# Patient Record
Sex: Male | Born: 1940 | Race: White | Hispanic: No | Marital: Married | Smoking: Former smoker
Health system: Southern US, Community
[De-identification: ages and names within clinical notes are randomized; demographics above are authoritative.]

## PROBLEM LIST (undated history)

## (undated) DIAGNOSIS — I10 Essential (primary) hypertension: Secondary | ICD-10-CM

## (undated) DIAGNOSIS — S0183XA Puncture wound without foreign body of other part of head, initial encounter: Secondary | ICD-10-CM

## (undated) DIAGNOSIS — E119 Type 2 diabetes mellitus without complications: Secondary | ICD-10-CM

## (undated) DIAGNOSIS — T4145XA Adverse effect of unspecified anesthetic, initial encounter: Secondary | ICD-10-CM

## (undated) DIAGNOSIS — G4733 Obstructive sleep apnea (adult) (pediatric): Secondary | ICD-10-CM

## (undated) DIAGNOSIS — C4490 Unspecified malignant neoplasm of skin, unspecified: Secondary | ICD-10-CM

## (undated) DIAGNOSIS — I4891 Unspecified atrial fibrillation: Secondary | ICD-10-CM

## (undated) DIAGNOSIS — T8859XA Other complications of anesthesia, initial encounter: Secondary | ICD-10-CM

## (undated) DIAGNOSIS — I639 Cerebral infarction, unspecified: Secondary | ICD-10-CM

## (undated) DIAGNOSIS — K219 Gastro-esophageal reflux disease without esophagitis: Secondary | ICD-10-CM

## (undated) DIAGNOSIS — C641 Malignant neoplasm of right kidney, except renal pelvis: Secondary | ICD-10-CM

## (undated) DIAGNOSIS — E785 Hyperlipidemia, unspecified: Secondary | ICD-10-CM

## (undated) DIAGNOSIS — I2510 Atherosclerotic heart disease of native coronary artery without angina pectoris: Secondary | ICD-10-CM

## (undated) HISTORY — PX: OTHER SURGICAL HISTORY: SHX169

## (undated) HISTORY — PX: THYROIDECTOMY, PARTIAL: SHX18

## (undated) HISTORY — DX: Type 2 diabetes mellitus without complications: E11.9

## (undated) HISTORY — PX: CARDIAC CATHETERIZATION: SHX172

## (undated) HISTORY — PX: TONSILLECTOMY AND ADENOIDECTOMY: SUR1326

## (undated) HISTORY — PX: NASAL SINUS SURGERY: SHX719

## (undated) HISTORY — PX: KNEE ARTHROSCOPY: SHX127

## (undated) HISTORY — PX: SHOULDER ARTHROSCOPY: SHX128

---
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
## 1998-03-06 DIAGNOSIS — C641 Malignant neoplasm of right kidney, except renal pelvis: Secondary | ICD-10-CM

## 1998-03-06 HISTORY — PX: PARTIAL NEPHRECTOMY: SHX414

## 1998-03-06 HISTORY — DX: Malignant neoplasm of right kidney, except renal pelvis: C64.1

## 1998-06-10 ENCOUNTER — Encounter: Payer: Self-pay | Admitting: Family Medicine

## 1998-06-10 ENCOUNTER — Ambulatory Visit (HOSPITAL_COMMUNITY): Admission: RE | Admit: 1998-06-10 | Discharge: 1998-06-10 | Payer: Self-pay | Admitting: *Deleted

## 1998-07-05 ENCOUNTER — Inpatient Hospital Stay (HOSPITAL_COMMUNITY): Admission: RE | Admit: 1998-07-05 | Discharge: 1998-07-12 | Payer: Self-pay | Admitting: Urology

## 1998-07-07 ENCOUNTER — Encounter: Payer: Self-pay | Admitting: Urology

## 1998-07-08 ENCOUNTER — Encounter: Payer: Self-pay | Admitting: Urology

## 1999-08-02 ENCOUNTER — Ambulatory Visit (HOSPITAL_COMMUNITY): Admission: RE | Admit: 1999-08-02 | Discharge: 1999-08-02 | Payer: Self-pay | Admitting: Orthopedic Surgery

## 1999-08-02 ENCOUNTER — Encounter: Payer: Self-pay | Admitting: Orthopedic Surgery

## 1999-10-11 ENCOUNTER — Ambulatory Visit (HOSPITAL_COMMUNITY): Admission: RE | Admit: 1999-10-11 | Discharge: 1999-10-11 | Payer: Self-pay | Admitting: Urology

## 1999-10-11 ENCOUNTER — Encounter: Payer: Self-pay | Admitting: Urology

## 2000-02-09 ENCOUNTER — Ambulatory Visit (HOSPITAL_COMMUNITY): Admission: RE | Admit: 2000-02-09 | Discharge: 2000-02-09 | Payer: Self-pay | Admitting: Orthopedic Surgery

## 2001-05-13 ENCOUNTER — Encounter: Payer: Self-pay | Admitting: Urology

## 2001-05-13 ENCOUNTER — Encounter: Admission: RE | Admit: 2001-05-13 | Discharge: 2001-05-13 | Payer: Self-pay | Admitting: Urology

## 2001-05-17 ENCOUNTER — Encounter: Payer: Self-pay | Admitting: Urology

## 2001-05-17 ENCOUNTER — Encounter: Admission: RE | Admit: 2001-05-17 | Discharge: 2001-05-17 | Payer: Self-pay | Admitting: Urology

## 2001-06-27 ENCOUNTER — Encounter: Payer: Self-pay | Admitting: *Deleted

## 2001-06-27 ENCOUNTER — Ambulatory Visit (HOSPITAL_COMMUNITY): Admission: RE | Admit: 2001-06-27 | Discharge: 2001-06-27 | Payer: Self-pay | Admitting: *Deleted

## 2003-05-14 ENCOUNTER — Ambulatory Visit (HOSPITAL_COMMUNITY): Admission: RE | Admit: 2003-05-14 | Discharge: 2003-05-14 | Payer: Self-pay | Admitting: *Deleted

## 2004-01-15 ENCOUNTER — Ambulatory Visit (HOSPITAL_COMMUNITY): Admission: RE | Admit: 2004-01-15 | Discharge: 2004-01-15 | Payer: Self-pay | Admitting: Family Medicine

## 2004-04-22 ENCOUNTER — Encounter: Admission: RE | Admit: 2004-04-22 | Discharge: 2004-04-22 | Payer: Self-pay | Admitting: Neurology

## 2005-03-06 DIAGNOSIS — I639 Cerebral infarction, unspecified: Secondary | ICD-10-CM

## 2005-03-06 HISTORY — DX: Cerebral infarction, unspecified: I63.9

## 2005-04-03 ENCOUNTER — Ambulatory Visit: Payer: Self-pay | Admitting: Gastroenterology

## 2005-04-04 ENCOUNTER — Ambulatory Visit: Payer: Self-pay | Admitting: Gastroenterology

## 2005-06-30 ENCOUNTER — Ambulatory Visit: Payer: Self-pay | Admitting: Cardiology

## 2005-08-04 ENCOUNTER — Ambulatory Visit (HOSPITAL_COMMUNITY): Admission: RE | Admit: 2005-08-04 | Discharge: 2005-08-05 | Payer: Self-pay | Admitting: General Surgery

## 2005-08-04 ENCOUNTER — Encounter (INDEPENDENT_AMBULATORY_CARE_PROVIDER_SITE_OTHER): Payer: Self-pay | Admitting: *Deleted

## 2005-11-18 ENCOUNTER — Inpatient Hospital Stay (HOSPITAL_COMMUNITY): Admission: EM | Admit: 2005-11-18 | Discharge: 2005-11-21 | Payer: Self-pay | Admitting: Emergency Medicine

## 2005-11-20 ENCOUNTER — Encounter (INDEPENDENT_AMBULATORY_CARE_PROVIDER_SITE_OTHER): Payer: Self-pay | Admitting: Interventional Cardiology

## 2005-11-27 ENCOUNTER — Encounter: Admission: RE | Admit: 2005-11-27 | Discharge: 2005-11-27 | Payer: Self-pay | Admitting: Nurse Practitioner

## 2006-02-20 ENCOUNTER — Encounter: Admission: RE | Admit: 2006-02-20 | Discharge: 2006-02-20 | Payer: Self-pay | Admitting: Family Medicine

## 2007-02-07 ENCOUNTER — Encounter (INDEPENDENT_AMBULATORY_CARE_PROVIDER_SITE_OTHER): Payer: Self-pay | Admitting: Orthopedic Surgery

## 2007-02-07 ENCOUNTER — Ambulatory Visit: Payer: Self-pay | Admitting: Surgery

## 2007-02-07 ENCOUNTER — Ambulatory Visit (HOSPITAL_COMMUNITY): Admission: RE | Admit: 2007-02-07 | Discharge: 2007-02-07 | Payer: Self-pay | Admitting: Orthopedic Surgery

## 2007-12-16 ENCOUNTER — Ambulatory Visit: Payer: Self-pay

## 2008-03-06 HISTORY — PX: TOTAL KNEE ARTHROPLASTY: SHX125

## 2008-03-23 ENCOUNTER — Ambulatory Visit: Payer: Self-pay | Admitting: Cardiology

## 2008-03-23 DIAGNOSIS — I2510 Atherosclerotic heart disease of native coronary artery without angina pectoris: Secondary | ICD-10-CM | POA: Insufficient documentation

## 2008-03-23 DIAGNOSIS — E785 Hyperlipidemia, unspecified: Secondary | ICD-10-CM | POA: Insufficient documentation

## 2008-03-23 DIAGNOSIS — I10 Essential (primary) hypertension: Secondary | ICD-10-CM | POA: Insufficient documentation

## 2008-03-30 ENCOUNTER — Ambulatory Visit: Payer: Self-pay

## 2008-04-27 ENCOUNTER — Inpatient Hospital Stay (HOSPITAL_COMMUNITY): Admission: RE | Admit: 2008-04-27 | Discharge: 2008-05-01 | Payer: Self-pay | Admitting: Orthopedic Surgery

## 2008-10-06 ENCOUNTER — Ambulatory Visit (HOSPITAL_COMMUNITY): Admission: RE | Admit: 2008-10-06 | Discharge: 2008-10-06 | Payer: Self-pay | Admitting: Urology

## 2009-10-20 ENCOUNTER — Ambulatory Visit (HOSPITAL_BASED_OUTPATIENT_CLINIC_OR_DEPARTMENT_OTHER): Admission: RE | Admit: 2009-10-20 | Discharge: 2009-10-20 | Payer: Self-pay | Admitting: Family Medicine

## 2009-10-24 ENCOUNTER — Ambulatory Visit: Payer: Self-pay | Admitting: Internal Medicine

## 2009-10-28 ENCOUNTER — Ambulatory Visit (HOSPITAL_COMMUNITY): Admission: RE | Admit: 2009-10-28 | Discharge: 2009-10-28 | Payer: Self-pay | Admitting: Urology

## 2010-06-21 LAB — BASIC METABOLIC PANEL
BUN: 16 mg/dL (ref 6–23)
CO2: 25 mEq/L (ref 19–32)
CO2: 28 mEq/L (ref 19–32)
CO2: 29 mEq/L (ref 19–32)
Calcium: 8.1 mg/dL — ABNORMAL LOW (ref 8.4–10.5)
Chloride: 101 mEq/L (ref 96–112)
Chloride: 101 mEq/L (ref 96–112)
Chloride: 103 mEq/L (ref 96–112)
Creatinine, Ser: 1.63 mg/dL — ABNORMAL HIGH (ref 0.4–1.5)
GFR calc Af Amer: 51 mL/min — ABNORMAL LOW (ref >60)
GFR calc Af Amer: >60 mL/min (ref >60)
Glucose, Bld: 127 mg/dL — ABNORMAL HIGH (ref 70–99)
Glucose, Bld: 152 mg/dL — ABNORMAL HIGH (ref 70–99)
Potassium: 3.6 mEq/L (ref 3.5–5.1)
Potassium: 3.6 mEq/L (ref 3.5–5.1)
Sodium: 134 mEq/L — ABNORMAL LOW (ref 135–145)
Sodium: 136 mEq/L (ref 135–145)

## 2010-06-21 LAB — COMPREHENSIVE METABOLIC PANEL
Albumin: 3.8 g/dL (ref 3.5–5.2)
Alkaline Phosphatase: 32 U/L — ABNORMAL LOW (ref 39–117)
BUN: 17 mg/dL (ref 6–23)
Chloride: 109 mEq/L (ref 96–112)
Creatinine, Ser: 1.32 mg/dL (ref 0.4–1.5)
GFR calc non Af Amer: 54 mL/min — ABNORMAL LOW (ref >60)
Glucose, Bld: 102 mg/dL — ABNORMAL HIGH (ref 70–99)
Potassium: 4.4 mEq/L (ref 3.5–5.1)
Total Bilirubin: 0.7 mg/dL (ref 0.3–1.2)

## 2010-06-21 LAB — URINALYSIS, ROUTINE W REFLEX MICROSCOPIC
Bilirubin Urine: NEGATIVE
Hgb urine dipstick: NEGATIVE
Ketones, ur: NEGATIVE mg/dL
Nitrite: NEGATIVE
Protein, ur: NEGATIVE mg/dL
Urobilinogen, UA: 1 mg/dL (ref 0.0–1.0)

## 2010-06-21 LAB — PROTIME-INR
INR: 1 (ref 0.00–1.49)
INR: 1.2 (ref 0.00–1.49)
INR: 1.9 — ABNORMAL HIGH (ref 0.00–1.49)
INR: 2.8 — ABNORMAL HIGH (ref 0.00–1.49)
Prothrombin Time: 13.7 seconds (ref 11.6–15.2)
Prothrombin Time: 20.1 seconds — ABNORMAL HIGH (ref 11.6–15.2)
Prothrombin Time: 22.6 seconds — ABNORMAL HIGH (ref 11.6–15.2)

## 2010-06-21 LAB — CBC
HCT: 40 % (ref 39.0–52.0)
HCT: 33.1 % — ABNORMAL LOW (ref 39.0–52.0)
Hemoglobin: 13.6 g/dL (ref 13.0–17.0)
Hemoglobin: 11.1 g/dL — ABNORMAL LOW (ref 13.0–17.0)
Hemoglobin: 9.9 g/dL — ABNORMAL LOW (ref 13.0–17.0)
MCHC: 33.2 g/dL (ref 30.0–36.0)
MCHC: 33.3 g/dL (ref 30.0–36.0)
MCV: 87.4 fL (ref 78.0–100.0)
MCV: 88.6 fL (ref 78.0–100.0)
Platelets: 223 10*3/uL (ref 150–400)
Platelets: 183 10*3/uL (ref 150–400)
RBC: 3.22 MIL/uL — ABNORMAL LOW (ref 4.22–5.81)
RBC: 3.38 MIL/uL — ABNORMAL LOW (ref 4.22–5.81)
RBC: 3.75 MIL/uL — ABNORMAL LOW (ref 4.22–5.81)
RDW: 12.8 % (ref 11.5–15.5)
WBC: 6.5 10*3/uL (ref 4.0–10.5)
WBC: 12.1 10*3/uL — ABNORMAL HIGH (ref 4.0–10.5)

## 2010-06-21 LAB — TYPE AND SCREEN: Antibody Screen: NEGATIVE

## 2010-06-21 LAB — APTT: aPTT: 27 seconds (ref 24–37)

## 2010-07-19 NOTE — H&P (Signed)
NAME:  Henry Martin, Henry Martin NO.:  [id number]   MEDICAL RECORD NO.:  [id number]          PATIENT TYPE:  INP   LOCATION:                               FACILITY:  [facility]   PHYSICIAN:  Ollen Gross, M.D.    DATE OF BIRTH:  May 14, 1940   DATE OF ADMISSION:  04/17/2008  DATE OF DISCHARGE:                              HISTORY & PHYSICAL   CHIEF COMPLAINT:  Left knee pain.   HISTORY OF PRESENT ILLNESS:  The patient is a 70 year old male who has  been seeing Dr. Lequita Halt for ongoing left knee pain.  He has had problems  for quite a while now. He has undergone an open arthrotomy and medial  meniscectomy years ago and also had arthroscopy by Dr. Simonne Come 4-5  years ago.  He initially did well but has continued to have debilitating  pain and dysfunction.  He is now found to have end-stage arthritis of  left knee with bone-on-bone felt to be a good candidate.  Risks and  benefits have been discussed.  He elects to proceed with surgery.   ALLERGIES:  ISOVUE CONTRAST DYE and also CORTISONE.   CURRENT MEDICATIONS:  Aggrenox, Neurontin, Benicar, diltiazem, and  Lovaza. Vitamin D. Triplex.  Simvastatin.   PAST MEDICAL HISTORY:  History of CVA, hypertension, past history of  ileus following kidney surgery.  Past history of mild renal  insufficiency, and history of renal cancer.  Past history of blood clots  with embolus. Also a history of measles, mumps.  Mild coronary arterial  disease.   PAST SURGICAL HISTORY:  Right nephrectomy in 2000 secondary to cancer.  Partial thyroidectomy.  cardiac catheterizations x3 (Two were done for a  study with Condon. A third was done for EKG changes).   FAMILY HISTORY:  Father deceased at age 78 with kidney disease.  Mother  deceased at age 61 with congestive heart failure, diabetes, had two  uncles, both with kidney disease.   SOCIAL HISTORY:  Married, quit smoking about 1979.  One drink of alcohol  per month.  One child. And has 2 steps  entering his home.   REVIEW OF SYSTEMS:  GENERAL:  No fevers, chills, night sweats.  NEUROLOGICAL: No seizures, syncope, or paralysis.  Respiratory: No  shortness breath, productive cough, or hemoptysis.  CARDIOVASCULAR:  No  chest pain, angina, or orthopnea.  GI: No nausea, vomiting, diarrhea, or  constipation.  GU: No dysuria, hematuria, or discharge.  MUSCULOSKELETAL:  Left knee.   PHYSICAL EXAMINATION:  VITAL SIGNS: Pulse 72, respirations 12, blood  pressure 140/72.  GENERAL:  A 70 year old white male, overweight, large frame, alert and  oriented, cooperative. Accompanied by his wife.  HEENT: Normocephalic, atraumatic.  Pupils are round and reactive.  EOMs  intact.  Wears glasses.  NECK:  Supple.  CHEST: He is a barrel-chested individual.  Bowel sounds are present and  clear.  HEART:  Regular rate and rhythm.  No murmur.  ABDOMEN: Soft, round, protuberant abdomen. Bowel sounds present.  Rectal, breasts and genitalia were not done, not pertinent to present  illness.  EXTREMITIES:  Left  knee slight varus malalignment deformity.  No  effusion.  Range of motion 5-120 marked crepitus noted.   IMPRESSION:  1. Osteoarthritis left knee.   IMPRESSION:  Osteoarthritis, left knee.   PLAN:  The patient was admitted to [facility] to undergo a  left total knee replacement arthroplasty.  Surgery will be performed by  Ollen Gross.      Alexzandrew L. Perkins, P.A.C.      Ollen Gross, M.D.  Electronically Signed    ALP/MEDQ  D:  04/26/2008  T:  04/27/2008  Job:  [id number]   cc:   Ollen Gross, M.D.  Fax: [phone]   Madolyn Frieze. Jens Som, MD, [facility]  [address]  [address]  [place]  [place] [address]   Wilber Bihari. Caryn Section, M.D.  Fax: [phone]   Veverly Fells. Vernie Ammons, M.D.  Fax: [phone]   Ernestina Penna, M.D.  Fax: [phone]

## 2010-07-19 NOTE — Op Note (Signed)
NAME:  Henry Martin, Henry Martin NO.:  [id number]   MEDICAL RECORD NO.:  [id number]          PATIENT TYPE:  INP   LOCATION:  0003                         FACILITY:  [facility]   PHYSICIAN:  Ollen Gross, M.D.    DATE OF BIRTH:  1940/08/05   DATE OF PROCEDURE:  04/27/2008  DATE OF DISCHARGE:                               OPERATIVE REPORT   PREOPERATIVE DIAGNOSIS:  Osteoarthritis of the left knee.   POSTOPERATIVE DIAGNOSIS:  Osteoarthritis of the left knee.   PROCEDURE:  Left total knee arthroplasty.   SURGEON:  Ollen Gross, M.D.   ASSISTANT:  Tish Men.   ANESTHESIA:  Spinal.   ESTIMATED BLOOD LOSS:  Minimal.   DRAINS:  None.   TOURNIQUET TIME:  39 minutes at 300 mmHg.   COMPLICATIONS:  None.   CONDITION.:  Stable to recovery.   BRIEF CLINICAL NOTE:  Henry Martin is a 70 year old male with end-stage  arthritis of the left knee with progressively worsening pain and  dysfunction.  He has failed nonoperative management and presents for  total knee arthroplasty.   PROCEDURE IN DETAIL:  After the successful administration of spinal  anesthetic, a tourniquet is placed high on his left thigh and his left  lower extremity is prepped and draped in the usual sterile fashion.  The  extremity is wrapped in an Esmarch, the knee flexed, the tourniquet  inflated to 300 mmHg.  A midline incision is made with a 10 blade  through the subcutaneous tissue to the level of the extensor mechanism.  A fresh blade is used to make a medial parapatellar arthrotomy.  The  soft tissue over the proximal medial tibia is subperiosteally elevated  to the joint line with the knife and into the semimembranosus bursa with  a Cobb elevator.  The soft tissue laterally is elevated, with attention  being paid to avoid the patellar tendon on tibial tubercle.  The patella  is subluxated laterally, the knee flexed 90 degrees, and the ACL and PCL  removed.  A drill is used create a starting hole in  the distal femur and  the canal is thoroughly irrigated.  The 5-degree left valgus alignment  guide is placed and referencing off the posterior condyles, rotation is  marked and the block pinned to remove 11 mm off the distal femur.  I  took 11 because of a preop flexion contracture.  Distal femoral  resection is made with an oscillating saw.  A sizing block is placed and  a size 5 is most appropriate.  Rotation is marked off the epicondylar  axis.  The size 5 cutting block is placed and the anterior-posterior and  chamfer cuts are made.   The tibia is subluxated forward and the menisci are removed.  The  extramedullary tibial alignment guide is placed referencing proximally  at the medial aspect of the tibial tubercle and distally along the  second metatarsal axis and tibial crest.  The block is pinned to remove  10 mm off the non deficient lateral side.  Tibial resection is made with  an oscillating saw.  A  size 5 is the most appropriate tibial component  and the proximal tibia is prepared with a modular drill and keel punch  for the size 5.  Femoral preparation is completed with the intercondylar  cut.   A size 5 mobile bearing tibial trial, a size 5 posterior stabilized  femoral trial and a 10-mm posterior stabilized rotating platform insert  trial are placed.  With the 10, there is a little bit of varus-valgus  play but some little hyperextension, so I went with the 12.5, which  allowed for full extension with excellent varus-valgus and the anterior-  posterior balance throughout full range of motion.  The patella is then  everted, thickness measured to be 27 mm.  Freehand resection is taken to  15 mm, a 41 template is placed, lug holes are drilled, trial patella is  placed and it tracks normally.  Osteophytes are removed off the  posterior femur with the trial in place.  All the trials are removed and  the cut bone surfaces are prepared with pulsatile lavage.  The cement is   mixed and once ready for implantation, a size 5 mobile bearing tibial  tray, size 5 posterior stabilized femur and 41 patella are cemented into  place and the patella is held with a clamp.  The trial 12.5 insert is  placed, the knee held in full extension, and all extruded cement  removed.  When the cement is fully hardened, then the permanent 12.5-mm  posterior stabilized rotating platform insert is placed into the tibial  tray.  The wound is copiously irrigated with saline solution and FloSeal  injected on the posterior capsule, the medial and lateral gutters, and  suprapatellar area.  A moist sponge is placed and the tourniquet  released, for a total tourniquet time of 39 minutes.  The sponge is held  for 2 minutes, then removed.  Minimal bleeding is encountered.  The  bleeding that is encountered is stopped with electrocautery.  The wound  is again copiously irrigated and the arthrotomy closed with interrupted  #1 PDS.  Flexion against gravity is 140 degrees.  The subcu is closed  with interrupted 2-0 Vicryl and subcuticular running 4-0 Monocryl.  The  incision is cleaned and dried and Steri-Strips and a bulky sterile  dressing applied.  He is placed into a knee immobilizer, awakened and  transported to recovery in stable condition.      Ollen Gross, M.D.  Electronically Signed     FA/MEDQ  D:  04/27/2008  T:  04/28/2008  Job:  [id number]

## 2010-07-19 NOTE — Assessment & Plan Note (Signed)
[facility]                            CARDIOLOGY OFFICE NOTE   NAME:Henry Martin, Henry Martin                       MRN:          [id number]  DATE:03/23/2008                            DOB:          1940/05/04    Henry Martin is a very pleasant 70 year old gentleman who presents for  preoperative evaluation prior to left knee replacement.  The patient did  undergo cardiac catheterization in 2003 secondary to chest pain.  At  that time, he was found to have nonobstructive coronary disease and  there was a 70% proximal second marginal branch.  His ejection fraction  was 65%.  He had a repeat catheterization in 2005 as part of the  ASTEROID study.  At that time, he was found to have a 30% mid LAD, a 20-  30% circumflex, and a 20% marginal.  There was a 30% mid RCA.  His LV  function was normal.  The patient is scheduled to have left knee  replacement and we are asked to evaluate prior to his surgery.  Note, he  has dyspnea with more extreme activities, but not with routine  activities.  There is no orthopnea, PND, pedal edema, palpitations,  presyncope, syncope, or exertional chest pain.  There is no  claudication.   MEDICATIONS:  1. Benicar 40 mg/25 mg tablets one p.o. daily.  2. Fish oil 1 g p.o. daily.  3. Trilipix 135 mg p.o. daily.  4. Zocor 5 mg p.o. daily.  5. Lovaza.  6. Aggrenox.  7. Vitamin D.  8. Cardizem 180 mg p.o. daily.   ALLERGIES:  He has an allergy to IVP DYE by his report.   FAMILY HISTORY:  Negative for coronary artery disease.   PAST MEDICAL HISTORY:  Significant for hypertension and hyperlipidemia.  There is no diabetes mellitus.  He has had previous partial  thyroidectomy secondary to cyst.  He has had a history of partial  nephrectomy secondary to renal cell carcinoma.  He has had prior  TIA/CVA.  He has had a previous tonsillectomy.  He has also had previous  arthroscopic knee surgery.   SOCIAL HISTORY:  He has a remote history  of tobacco use, but has not  smoked in the past 30 years.  He rarely consumes alcohol.   REVIEW OF SYSTEMS:  He denies any headaches, fevers, or chills.  There  is no productive cough or hemoptysis.  There is no dysphagia,  odynophagia, melena, or hematochezia.  There is no dysuria or hematuria.  There is no rash or seizure activity.  There is no orthopnea, PND, or  pedal edema.  He does have some pain in his left knee with ambulation.  The remaining systems are negative.   PHYSICAL EXAMINATION:  VITAL SIGNS:  Today shows a blood pressure of  126/70 and his pulse of 59.  He weighs 253 pounds.  GENERAL:  He is a well-developed, well-nourished in no acute distress.  SKIN:  Warm and dry.  He does not appeared to be depressed.  There is no  peripheral clubbing.  BACK:  Normal.  HEENT:  Normal  with normal eyelids.  NECK:  Supple with a normal upstroke bilaterally.  No bruits noted.  There is no jugular venous distention.  I cannot appreciate thyromegaly.  CHEST:  Clear to auscultation.  No expansion.  CARDIOVASCULAR:  Regular rate and rhythm.  Normal S1 and S2.  There are  no murmurs, rubs, or gallops noted.  Note, his heart sounds were  distant.  ABDOMEN:  Nontender, nondistended.  Positive bowel sounds.  No  hepatosplenomegaly.  No mass appreciated.  There is no abdominal bruit.  He has 2+ femoral pulses bilaterally.  No bruits.  EXTREMITIES:  No edema.  I could palpate no cords.  He has 2+ posterior  tibial pulses bilaterally.  NEUROLOGIC:  Grossly intact.   His electrocardiogram shows a normal sinus rhythm at a rate of 61.  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  There are no ST  changes noted.   DIAGNOSES:  1. Preoperative evaluation prior to knee replacement - The patient has      mild dyspnea on exertion and also has hypertension, hyperlipidemia,      and remote history of tobacco use.  I  think it would be worthwhile      to proceed with an adenosine Myoview for risk stratification.  If      he has low risk, then he will be able to proceed safely with      surgery.  We will see him back on a p.r.n. basis, pending those      results.  2. Hypertension - His blood pressure is adequately controlled on his      present medications.  3. Hyperlipidemia - He will continue on his statin and Trilipix, and      this is being monitored by Dr. Christell Constant.  4. Remote history of renal cell cancer.  5. History of partial thyroidectomy.  6. CONTRAST DYE allergy.   I will see him back on an as-needed basis, pending results of his  Myoview.     Madolyn Frieze Jens Som, MD, [facility]  Electronically Signed    BSC/MedQ  DD: 03/23/2008  DT: 03/23/2008  Job #: [id number]   cc:   Ernestina Penna, M.D.  Mila Homer. Sherlean Foot, M.D.

## 2010-07-22 NOTE — Cardiovascular Report (Signed)
[place]  Patient:    Henry Martin, Henry Martin Visit Number: [id number] MRN: [id number]          Service Type: CAT Location: [facility] 2899 19 Attending Physician:  Veneda Melter Dictated by:   Veneda Melter, M.D. Proc. Date: 06/27/01 Admit Date:  06/27/2001   CC:         Madolyn Frieze. Jens Som, M.D. [facility]  Monica Becton, M.D.   Cardiac Catheterization  PROCEDURES PERFORMED: 1. Left heart catheterization. 2. Left ventriculogram. 3. Selective coronary angiography. 4. Intravascular ultrasound, left circumflex artery.  DIAGNOSES: 1. Mild coronary artery disease by angiogram. 2. Normal left ventricular systolic function.  HISTORY: The patient is a 70 year old white male, who had an episode of chest discomfort approximately six months ago. The patient underwent stress imaging study showing mild to moderate ischemia in the inferior wall with overall well preserved LV function. He presents for further assessment.  TECHNIQUE: After informed consent was obtained, the patient was brought to the cardiac catheterization lab where a 6 French sheath was placed in the right femoral artery. Left heart catheterization, left ventriculogram and selective coronary angiography were then performed using preformed 6 French Judkins catheters. We then elected to proceed with intravascular ultrasound of the left circumflex artery. The patient was given 3000 units of heparin intravenously. A 6 French CL 3.5 guide catheter was used to engage the left coronary artery and selective guide shots obtained after the administration of intracoronary nitroglycerin. A 0.014 inch Hi-Torque Floppy wire was advanced to the distal segment and second marginal branch, and intravascular ultrasound performed using automatic pullback. Repeat angiography was performed showing no evidence of distal vessel damage. The guide catheters and sheath were then manual pressure applied until adequate  hemostasis was achieved. The patient tolerated the procedure well and was transferred to the ward in stable condition.  FINDINGS: Findings are as follows: 1. Left main trunk: Short, angiographically normal. 2. LAD: This is a medium caliber vessel that provides a first    diagonal branch in the mid section. The LAD then extends to the apex.    There are mild irregularities in the LAD system of 10-20%. There does    appear to be mild focal narrowing of 20-30% after the diagonal branch.    The diagonal branch is a small caliber vessel with mild irregularities. 3. Left circumflex artery: This is a large caliber vessel that provides a    medium caliber first marginal branch in the proximal segment and a large    second marginal branch distally. The left circumflex system has mild    diffuse disease of 30% in the AV segment and proximal 70% in the second    marginal branch. 4. Right coronary artery is dominant: This is a large caliber vessel that    provides the posterior descending artery and two posterior ventricular    branches in its terminal segment. There is mild diffuse disease in the    proximal mid section of 20%. There is focal narrowing of 30% in the    junction of the mid and distal third vessel. In the distal branch, vessels    have mild irregularities.  LEFT VENTRICULOGRAM: Normal end-systolic and end-diastolic dimensions. Overall left ventricular function is well preserved, ejection fraction of greater than 65%.  No mitral regurgitation. LV pressure 150/15, aortic is 150/80, LVEDP equals 25.  ASSESSMENT AND PLAN: The patient is a 70 year old gentleman with mild coronary artery disease by angiogram. Continued medical therapy and aggressive  risk factor modification will be pursued.  The patient has undergone intravascular ultrasound for participation in the ASTEROID study and he will be treated per protocol. Dictated by:   Veneda Melter, M.D. Attending Physician:  Veneda Melter DD:  06/27/01 TD:  06/27/01 Job: [id number]

## 2010-07-22 NOTE — H&P (Signed)
NAME:  Henry Martin, Henry Martin                ACCOUNT NO.:  [id number]   MEDICAL RECORD NO.:  [id number]          PATIENT TYPE:  INP   LOCATION:  1825                         FACILITY:  [facility]   PHYSICIAN:  Michael L. Reynolds, M.D.DATE OF BIRTH:  Apr 26, 1940   DATE OF ADMISSION:  11/18/2005  DATE OF DISCHARGE:                                HISTORY & PHYSICAL   CHIEF COMPLAINT:  Left leg weakness.   HISTORY OF PRESENT ILLNESS:  This is the initial stroke service admission  for this 70 year old male with a past medical history which includes  hypertension, hypercholesterolemia.  The patient was working outside today  when about 11:00, he suddenly felt a dizzy sensation which was more like a  sensation of possibly passing out rather than a true vertigo.  He did notice  that his left leg felt funny especially from the knee down.  When he tried  to walk, it was weak, and it was dragging behind him, and he actually  required some assistance to ambulate.  He did not notice any symptoms at the  time involving his face or his arm.  EMS was alerted, and he was brought to  [facility] Emergency Room and code stroke was called en route.  On arrival,  the patient has had a definitely improved strength in his left leg and says  it no longer feels funny, but it still feels a little bit weak.  He denies  any history of previous similar events.  He denies any associated chest  pain, shortness of breath, nausea, vomiting, or loss of consciousness.  He  does have a slight right parietal headache.   MEDICAL HISTORY:  1. Hypertension.  2. High cholesterol.  3. He has mild coronary artery disease, done by catheterization, last done      a couple of years ago.  4. 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  5. He has had a partial nephrectomy for renal cell carcinoma in the past.   FAMILY HISTORY:  Negative for stroke.  His mother has a history of  congestive heart failure but is still alive at 64.   SOCIAL HISTORY:  He denies any tobacco use.  He is normally independent in  activities of daily living.   ALLERGIES:  IODINE.   MEDICATIONS:  1. Benicar HCT 40/12.5 mg daily.  2. Lipitor 10 mg daily.  3. Cartia XT 180 mg daily.  4. Baby aspirin 81 mg daily.  5. Multivitamin daily.   REVIEW OF SYSTEMS:  Full extensive review of systems is negative except as  outlined in the HPI and in the admission H&P and in the admission nursing  record.   PHYSICAL EXAMINATION:  VITAL SIGNS:  Temperature 96.9, blood pressure  125/61, pulse 68, respirations 20.  GENERAL:  This is a healthy-appearing man, somewhat obese but in no evident  distress.  HEAD:  Cranium normocephalic, atraumatic.  Oropharynx is benign.  NECK:  Supple without carotid or supraclavicular bruits.  HEART:  Regular rate and  rhythm without murmurs.  CHEST:  Clear to auscultation bilaterally.  ABDOMEN:  Obese with normoactive bowel sounds and no hepatosplenomegaly.  EXTREMITIES:  No edema, 2+ pulses.  NEUROLOGIC:  Mental status:  He is awake, alert, and fully oriented.  Speech  is fluent, not dysarthric.  He can name objects and repeat phrases.  He can  name objects and repeat phrases.  Mood euthymic and affect appropriate.  Cranial nerves:  Pupils are equal and reactive.  Extraocular movements full  without nystagmus.  Visual fields full to confrontation.  Face, tongue, and  palate move normally and symmetrically.  Facial sensation is symmetric.  Motor:  Normal bulk and tone.  There is slight weakness in the left hip  flexors, otherwise normal strength in all tested extremity muscles.  Sensation is intact to light touch and pinprick in all extremities.  Cerebellar:  Rapid movements are normal.  Finger-to-nose is normal.  Gait is  deferred.   LABORATORY REVIEW:  CT of the head is personally reviewed, and the study is  normal.  CBC and CMET are pending at this time.    IMPRESSION:  1. Right transient ischemic attack with left lower extremity weakness      which in improving.  His risk factors include hypertension and      hypercholesterolemia.   PLAN:  We will admit to the stroke service for a routine stroke work-up  including MRI/MRA, carotid transcranial Dopplers, 2 D echocardiogram, and  stroke labs.  He is likely not going to need any therapy assessments.  We  will continue aspirin pending the outcome of his work-up and at that point,  make a switch likely to another antiplatelet therapy such as Aggrenox.  Stroke service to follow.      Michael L. Thad Ranger, M.D.  Electronically Signed     MLR/MEDQ  D:  11/18/2005  T:  11/18/2005  Job:  [id number]   cc:   Ernestina Penna, M.D.

## 2010-07-22 NOTE — Discharge Summary (Signed)
NAME:  Henry Martin, Henry Martin NO.:  [id number]   MEDICAL RECORD NO.:  [id number]          PATIENT TYPE:  INP   LOCATION:  1610                         FACILITY:  [facility]   PHYSICIAN:  Ollen Gross, M.D.    DATE OF BIRTH:  01/31/1941   DATE OF ADMISSION:  04/27/2008  DATE OF DISCHARGE:  05/01/2008                               DISCHARGE SUMMARY   ADMITTING DIAGNOSES:  1. Osteoarthritis, left knee.  2. History of cerebrovascular accident.  3. Hypertension.  4. Past history of ileus following kidney surgery.  5. Past history of mild renal insufficiency.  6. History of renal cancer.  7. Past history of blood clots with embolus.  8. History of measles and mumps.  9. Mild coronary arterial disease.   DISCHARGE DIAGNOSES:  1. Osteoarthritis, left knee, status post left total knee replacement      and arthroplasty.  2. Mild postoperative blood loss anemia, did not require transfusion.  3. Postoperative hyponatremia, improved.  4. Mild renal insufficiency, improving.  5. History of cerebrovascular accident.  6. Hypertension.  7. Past history of ileus following kidney surgery.  8. Past history of mild renal insufficiency.  9. History of renal cancer.  10.Past history of blood clots with embolus.  11.History of measles and mumps.  12.Mild coronary arterial disease.   PROCEDURE:  April 27, 2008:  Left total knee.   SURGEON:  Dr. Lequita Halt.   ASSISTANT:  Avel Peace, PA-C.   ANESTHESIA:  Spinal.   CONSULTATIONS:  None.   TOURNIQUET TIME:  39 minutes.   BRIEF HISTORY:  Mr. Kleckner is a 70 year old male with end-stage  arthritis of the left knee with progressive worsening pain and  dysfunction,failed nonoperative management, who now presents for total  knee arthroplasty.   LABORATORY DATA:  Preop CBC:  Hemoglobin 13.6, hematocrit 40.0, white  cell count 6.55.  Postop hemoglobin 11.1 drifted down to 9.9, last H and  H was 9.5 and 28.5.  PT/PTT preop 13.7 and 27,  respectively.  INR 1.0.  Serial prothrombin time as follow:  Last night PT/INR 31.4 and 2.8.  Chem panel on admission:  Slightly elevated sodium of 147, slightly low  ALP of 32.  Remaining chem panel within normal limits.  Serial BMETs are  as follows:  Sodium did drop down to 134 back to 136, creatinine went up  from 1.3 to 1.7, was improving back down to 1.6.  Remaining electrolytes  remained within normal limits.  Preop UA negative.  Blood group type A  negative.  Chest x-ray dated March 17, 2008:  Mild chronic bronchitic  changes.   EKG dated March 21, 2008:  Sinus rhythm, premature supraventricular  complexes, right bundle branch block, left anterior fascicular block,  left ventricular hypertrophy with QRS widening, confirmed, unable to  read signature.   HOSPITAL COURSE:  The patient admitted to [facility], taken to  the OR, underwent above-stated procedure without complication.  The  patient tolerated procedure well.  Later transferred to recovery room  and orthopedic floor.  Started on a PCA and p.o. analgesics for pain  control following surgery.  Started back on his home medications.  Pressure was a little soft, so we put his blood pressure medications on  parameters.  He did have a spinal with Duramorph.  Had a history of clot  so we added Lovenox until his INR is therapeutic.  Monitored his urine  output which was descent.  He had a little bit of bump in his creatinine  postoperatively but will monitor that.  Started getting him up out of  bed on day 1.  By day 2, he was doing a little bit better.  Seen in  rounds, sleeping well.  Hemoglobin was stable.  His creatinine had  bumped up to 1.6 though.  Watched his creatinine, watched his output,  continued with fluids, although his creatinine did improve and went up  to 1.78 back down to 1.6 by day 2.  He responded to the fluids and his  output increased by day 3.  He had a little bit of scant amount of   drainage.  Incision was healing well though.  He had a history of  constipation with a postop ileus so we added Reglan and some other  medication to help with his bowels.  He had a small amount of movement  on the evening of day 2 but needed a little assistance with medications.  He had a drop in his sodium also postop with the extra fluids we gave  him.  That had already turned around and started to improve, so we  monitored him another day.  His creatinine improved.  He started moving  his bowels and, by day 4, he was doing better, tolerating his  medications and discharged home.   DISCHARGE/PLAN:  1. Was discharged home on May 01, 2008.  2. Discharge diagnoses:  Please see above.  3. Discharge medications:  Coumadin, Nu-Iron, Percocet, Robaxin.   DIET:  As tolerated, the heart healthy diet.   FOLLOWUP:  Follow up in 2 weeks.   ACTIVITY:  Weightbearing as tolerated.  PT home.   DISPOSITION:  Home.   CONDITION ON DISCHARGE:  Improving.      Alexzandrew L. Perkins, P.A.C.      Ollen Gross, M.D.  Electronically Signed    ALP/MEDQ  D:  05/30/2008  T:  05/30/2008  Job:  [id number]   cc:   Madolyn Frieze. Jens Som, MD, [facility]  [address]  [address]  [place]  [place] [address]   Wilber Bihari. Caryn Section, M.D.  Fax: [phone]   Veverly Fells. Vernie Ammons, M.D.  Fax: [phone]   Candie Echevaria, M.D.

## 2010-07-22 NOTE — Discharge Summary (Signed)
NAME:  Henry Martin, Henry Martin                ACCOUNT NO.:  [id number]   MEDICAL RECORD NO.:  [id number]          PATIENT TYPE:  INP   LOCATION:  3010                         FACILITY:  [facility]   PHYSICIAN:  Pramod P. Pearlean Brownie, MD    DATE OF BIRTH:  1940-10-20   DATE OF ADMISSION:  11/18/2005  DATE OF DISCHARGE:  11/21/2005                                 DISCHARGE SUMMARY   DIAGNOSES AT TIME OF DISCHARGE.:  1. 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  2. Hypertension  3. Dyslipidemia.  4. Coronary artery disease.  5. 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  6. Partial nephrectomy for renal cell carcinoma in the past.   MEDICINE AT THE TIME OF DISCHARGE.:  1. Hydrochlorothiazide 12.5 mg daily.  2. Lipitor 10 mg a day.  3. Multivitamin a day.  4. Benicar 40 mg a day.  5. Cartia 120 mg a day.  6. Fish oil 1200 mg a day.  7. Aggrenox 1 nightly x2 weeks, then increase to two times a day.  8. Aspirin 81 mg q.a.m. x2 weeks, then discontinue.  9. Tylenol 2 tablets 30 minutes to 1 hour before Aggrenox dose x1 week      only.   STUDIES PERFORMED:  1. CT of the head on admission is normal.  2. MRI of the brain showed small acute nonhemorrhagic right centrum      semiovale infarct in the parasagittal region.  Scattered mild      nonspecific white matter type changes.  Paranasal sinus mucosal      thickening of the left maxillary sinus, most notably.  3. MRI of the neck shows no hemodynamically significant stenosis involving      either carotid bifurcation.  There may be some very minimal narrowing      of the proximal aspect of the common carotid arteries bilaterally.      Left vertebral artery is dominant.  There is also signal in the      proximal right vertebral artery, likely artifactual loss.  Right      vertebral artery and posterior inferior cerebellar artery distribution      normal variant.  4. MRA of the head shows focal loss of signal narrowing  at the junction of      A1 an A2 segment of the right anterior cerebral artery.  Slight      narrowing right subclinoid and internal carotid artery.  5. Chest x-ray shows no cardiopulmonary disease.  6. Transthoracic echocardiogram shows an EF of 60% with no embolic source.  7. EKG shows normal sinus rhythm with right bundle branch block, left      anterior fascicular block.  8. Carotid Doppler shows no significant ICA stenosis.  9. Transcranial Doppler has been performed.  Results are pending.   LABORATORY STUDIES:  CBC with hemoglobin 12.0, hematocrit 35.7, white blood  cells 8.4, red blood cells 4.14, platelets 235.  Differential normal.  Coagulation studies on admission normal.  Chemistry with calcium 8.3,  albumin 3.2, otherwise normal. Liver function tests normal.  Hemoglobin A1c  5.9.  Homocystine 8.5. Cholesterol 95, triglycerides 56, HDL 32 and LDL 52.  The TSH was normal at 3.823.  Urinalysis was normal.  Cardiac enzymes  negative x1.   HISTORY OF PRESENT ILLNESS:  [name] is a 70 year old white male  with a past medical history of hypertension and dyslipidemia.  He was  working outside the day of admission when at about 11 a.m. he felt suddenly  dizzy which was more like a sensation of possibly passing out rather than a  true vertigo.  He did notice that his left leg felt funny, especially from  the knee down.  When he tried to walk, it was weak and was dragging behind  him. He actually required some assistance to ambulate.  He did not notice  any symptoms at the time involving his face or arm.  EMS was alert, and he  was brought to [facility] Emergency Room where a code stroke was called en  route.  On arrival, the patient has definitely improved strength in his left  leg and says it no longer feels funny but still feels a bit weak.  He denies  any history of previous similar events. He does have a slight right parietal  headache.  He was not a TPA candidate  secondary to quick resolution.  He was  admitted to the hospital for further stroke evaluation.   HOSPITAL COURSE:  MRI did confirm an infarct in the right frontal  subcortical region which was found to be secondary to small-vessel disease.  He does have vascular risk factors of hypertension and dyslipidemia.  He was  on aspirin prior to admission.  Therefore, was changed Aggrenox for  secondary stroke prevention.  He had some mild deficits that remain on his  left side, and outpatient PT and OT were recommended and arranged.  He will  need followup with his primary care physician for vascular risk factor  control as well as to follow up with Dr. Pearlean Brownie in 2-3 months.   CONDITION ON DISCHARGE:  The patient alert and oriented x3.  No aphasia or  dysarthria.  No cranial nerve deficits per Dr. Marlis Edelson exam.  No focal  weakness.   DISCHARGE/PLAN:  1. Discharge home with family.  2. Aggrenox for secondary stroke prevention.  3. Outpatient PT and OT as per therapy recommendations.  4. Follow up with primary care physician for vascular risk factor control.      Goal LDL less than 100.  Goal systolic blood pressure less than 130.  5. Follow up with Dr. Pearlean Brownie in 2-3 months.      Annie Main, N.P.    ______________________________  Sunny Schlein. Pearlean Brownie, MD    SB/MEDQ  D:  11/21/2005  T:  11/22/2005  Job:  [id number]   cc:   Ernestina Penna, M.D.  Outpatient Rehab,  [address]

## 2010-07-22 NOTE — Cardiovascular Report (Signed)
NAME:  Henry Martin, Henry Martin                          ACCOUNT NO.:  [id number]   MEDICAL RECORD NO.:  [id number]                   PATIENT TYPE:  OIB   LOCATION:  NA                                   FACILITY:  [facility]   PHYSICIAN:  Veneda Melter, M.D.                   DATE OF BIRTH:  07/27/1940   DATE OF PROCEDURE:  05/14/2003  DATE OF DISCHARGE:                              CARDIAC CATHETERIZATION   PROCEDURES PERFORMED:  1. Left heart catheterization.  2. Left ventriculogram.  3. Selective coronary angiography.  4. Intravascular ultrasound of the left circumflex artery.   DIAGNOSES:  1. Coronary artery disease.  2. Normal left ventricular systolic function.   HISTORY:  Henry Martin is a 70 year old gentleman with known history of  coronary disease that is being treated medically.  He underwent cardiac  catheterization on June 27, 2001.  At that time, had ultrasound of the left  circumflex artery performed or participation in the asteroid study.  He  presents now for two-year followup.   TECHNIQUE:  Informed consent was obtained.  The patient brought to the  catheterization lab.  A 6 French sheath was placed in the right femoral  artery using the modified Seldinger technique.  A 6 French pigtail catheter  was advanced in the left ventricle and a left ventriculogram performed using  power injections of contrast.  Subsequently, 6 [place] JR-4 catheter was used  to engage the right coronary artery and selective angiography performed in  various projections using manual injections of contrast.  We then introduced  a 6 [place] CLS 3.5 guide catheter and selective angiography of the left  circumflex system was performed.  The patient was then given heparin for  systemic anticoagulation.  A 0.014-inch floppy wire advanced in the second  marginal branch.  Intravascular ultrasound performed after the  administration of 200 mcg of intracoronary nitroglycerin.  Repeat  angiography showed no vessel  damage.  The guide catheter was removed as was  the sheath and Angio-Seal closure device deployed at the right femoral  artery.  Hemostasis was achieved.  The patient tolerated the procedure well  and was transferred to the floor in stable condition.   FINDINGS:   LEFT HEART CATHETERIZATION:  1. Left main trunk:  Short, medium caliber vessel with mild irregularities.  2. LAD:  This is a large caliber vessel that provides a large diagonal     branch in the mid section.  The LAD has mild disease of 30% in the mid     section.  3. Left circumflex artery:  Large caliber vessel that provides two marginal     branches.  The AV circumflex has mild disease of 20-30%.  The marginal     branch has mild disease of 20%.  4. Right coronary artery is dominant.  This is a large caliber vessel that     provides posterior descending  artery and two posterior ventricular     branches.  The right coronary artery has moderate focal narrowing of 30%     in the mid section.  The remainder of the vessel has mild diffuse disease     of 20%.   LEFT VENTRICULOGRAPHY:  1. Normal end-systolic and end-diastolic dimensions.  2. Overall left ventricular function is well preserved.  3. Ejection fraction greater than 55%.  4. No mitral regurgitation.  5. LV pressure is 150/10.  6. Aortic pressure is 150/80.  7. LVEDP equals 20.  8. Intravascular ultrasound of the left circumflex artery shows it to be     large caliber vessel with mild concentric plaque and mild __________but     no critical stenosis.   ASSESSMENT AND PLAN:  Henry Martin is a 70 year old gentleman with stable  coronary artery disease and well preserved LV function.  Continued medical  therapy will be pursued.                                               Veneda Melter, M.D.    NG/MEDQ  D:  05/14/2003  T:  05/15/2003  Job:  [id number]   cc:   Henry Martin, M.D.  [address] [place]  [place] [address]  Fax: [phone]   Henry Martin, M.D.

## 2010-07-22 NOTE — Op Note (Signed)
[facility]  Patient:    JAMOL, GINYARD                       MRN: [id number] Proc. Date: 02/09/00 Adm. 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  OPERATION: 1. Right shoulder arthroscopy (normal exam). 2. Arthroscopic subacromial decompression, right shoulder.  SURGEON:  Illene Labrador. [place], M.D.  ASSISTANT:  Alexzandrew L. Perkins, P.A.-C.  ANESTHESIA:  General.  INDICATIONS.  Chronic progressive pain, right rotator cuff area, particularly with overhead activities.  An MRI has demonstrated degenerative changes at the [facility] joint and tendonopathy of the rotator cuff with impingement process. Clinically, he has no tenderness at the [facility] joint.  It was felt the above-mentioned surgery was indicated because of failure to respond to nonsurgical treatment.  DESCRIPTION OF PROCEDURE:  Satisfactory general anesthesia, beach chair position on the Schlein frame.  Right shoulder girdle was prepped with DuraPrep, draped in sterile field.  Shoulder was marked out including the posterior soft spot and mid lateral portal.  The two portal sites were infiltrated with 0.5% Marcaine with adrenaline as was the subacromial space. Through a posterior soft spot stab wound, using the blunt trocar, I was able to easily enter the glenohumeral joint which looked normal on exam. Documentary pictures were taken.  I then redirected the scope into the subacromial area and made a lateral portal and introduced the 4.2 shaver, cleaning up the bursa, doing some preliminary shaving of the undersurface of the acromion and smoothing down the rotator cuff.  I then introduced a 4.5 tissue ablator, and I used both the cutting and the  cautery to not only ablate tissue but to cauterize around the bleeding edge of the acromion.  I then reintroduced the 4.2 shaver, removing additional soft tissue, followed by a 4-0 bur, and I then alternated back between the three instruments until I felt that a thorough subacromial decompression had been performed.  Prior to bone resection, I took a picture with his arm down to his side showing a tight subacromial pathway, and then I took final pictures both with the arm to the side and arm abducted, showing wide clearance.  The shoulder joint was then evacuated of fluid.  The portals once again were reinjected with 0.5% Marcaine with adrenaline.  He was given 30 mg of Toradol IV, and the portals were closed with 4-0 nylon.  Betadine, Adaptic, and dry sterile dressing applied. He was placed in a shoulder immobilizer.  He tolerated the procedure well and was taken to the recovery room in satisfactory condition.  There were no known complications. DD:  02/09/00 TD:  02/09/00 Job: [id number]

## 2010-07-22 NOTE — Op Note (Signed)
NAME:  Henry Martin, Henry Martin                ACCOUNT NO.:  [id number]   MEDICAL RECORD NO.:  [id number]          PATIENT TYPE:  AMB   LOCATION:  SDS                          FACILITY:  [facility]   PHYSICIAN:  Angelia Mould. Derrell Lolling, M.D.DATE OF BIRTH:  28-Dec-1940   DATE OF PROCEDURE:  08/04/2005  DATE OF DISCHARGE:                                 OPERATIVE REPORT   PREOPERATIVE DIAGNOSIS:  Left thyroid nodule.   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   OPERATION PERFORMED:  Left thyroid lobectomy, frozen section.   SURGEON:  Angelia Mould. Derrell Lolling, M.D.   FIRST ASSISTANT:  Gita Kudo, M.D.   OPERATIVE INDICATIONS:  This is a 70 year old white man who was noted have a  mass in his left thyroid on physical exam.  He is asymptomatic.  Ultrasound  showed a mixed solid cystic lesion basically replacing the left thyroid lobe  measuring 6 by 3 by 4 cm.  Thyroid function tests are normal.  Fine needle  aspiration cytology of the left thyroid lesion showed atypical follicular  epithelium suspicious but not diagnostic for malignancy.  Physical exam and  ultrasound of the right lobe were normal.  He is advised to have elective  surgery and is brought to operating room electively   OPERATIVE TECHNIQUE:  Following the induction of general endotracheal  anesthesia, the patient's neck was prepped and draped in a sterile fashion.  This was done with the patient in a reversed Trendelenburg position with the  neck extended.  A curved transverse incision was made anteriorly in the neck  about 2 cm above the suprasternal notch.  Dissection was carried down  through the subcutaneous tissue and through the platysma muscle.  Skin and  platysma flaps were raised superiorly and inferiorly and a self-retaining  retractor was placed.  The strap muscles were divided in the midline and  dissected off of the left lobe.  The right lobe was palpated and felt  normal.  We continued to  dissect the strap muscles off of the left lobe of  the thyroid which contained a large nodule.  The superior thyroid vessels  were skeletonized, isolated, ligated in continuity with 2-0 silk ties and a  medium metal clip superiorly for extra security and then divided.  I felt  like we preserved the superior parathyroid gland in this location.  I then  took down the inferior pole vessels and mobilized the inferior pole out of  its bed and up all the way to the anterior trachea.  Some venous channels  were divided between metal clips.  We then had a fairly tedious dissection  to mobilize the thyroid medially.  The inferior thyroid artery and middle  thyroid vein were individually isolated, controlled with multiple metal  clips, and divided.  We did visualize and preserve the recurrent laryngeal  nerve.  We dissected the thyroid gland up.  Under direct vision, we divided  the ligament of Berry and then brought the thyroid gland all the way over to  the right side of the trachea.  Two hemostats were placed  across the isthmus  on the right side and the thyroid was divided.  The thyroid isthmus stump  was then suture ligated with two suture ligatures of 3-0 Vicryl.  The  specimen was sent to pathology.  Frozen section diagnosis was benign  hyperplastic and adenomatous nodule, partially cystic, no evidence of  malignancy.  The wound was irrigated with saline.  Hemostasis was excellent.  A piece of Surgicel gauze was left in the bed of the thyroid and we observed  for about ten minutes to make sure there was no bleeding and it was  completely dry.  The strap muscles were closed in the midline with  interrupted sutures of 3-0 Vicryl.  The platysma muscle was closed with  interrupted sutures of 3-0 Vicryl and the  skin closed with two skin staples and Steri-Strips.  Clean bandages were  placed.  The patient was taken to the recovery room in stable condition.  Estimated blood loss was about the 15-20  mL.  Complications were none.  Sponge and instrument counts were correct.      Angelia Mould. Derrell Lolling, M.D.  Electronically Signed     HMI/MEDQ  D:  08/04/2005  T:  08/05/2005  Job:  [id number]   cc:   Ernestina Penna, M.D.  Fax: [phone]

## 2010-08-12 ENCOUNTER — Other Ambulatory Visit (HOSPITAL_COMMUNITY): Payer: Self-pay | Admitting: Cardiology

## 2010-08-12 DIAGNOSIS — R609 Edema, unspecified: Secondary | ICD-10-CM

## 2010-08-16 ENCOUNTER — Ambulatory Visit (HOSPITAL_COMMUNITY): Payer: Medicare Other | Attending: Family Medicine

## 2010-08-16 ENCOUNTER — Encounter (HOSPITAL_COMMUNITY): Payer: Self-pay | Admitting: Family Medicine

## 2010-08-16 DIAGNOSIS — M7989 Other specified soft tissue disorders: Secondary | ICD-10-CM | POA: Insufficient documentation

## 2010-08-16 DIAGNOSIS — R0609 Other forms of dyspnea: Secondary | ICD-10-CM | POA: Insufficient documentation

## 2010-08-16 DIAGNOSIS — I079 Rheumatic tricuspid valve disease, unspecified: Secondary | ICD-10-CM | POA: Insufficient documentation

## 2010-08-16 DIAGNOSIS — R0989 Other specified symptoms and signs involving the circulatory and respiratory systems: Secondary | ICD-10-CM | POA: Insufficient documentation

## 2010-08-16 DIAGNOSIS — R609 Edema, unspecified: Secondary | ICD-10-CM

## 2010-11-01 ENCOUNTER — Other Ambulatory Visit (HOSPITAL_COMMUNITY): Payer: Self-pay | Admitting: Urology

## 2010-11-01 ENCOUNTER — Ambulatory Visit (HOSPITAL_COMMUNITY)
Admission: RE | Admit: 2010-11-01 | Discharge: 2010-11-01 | Disposition: A | Discharge status: Home / Self Care | Payer: Medicare Other | Source: Ambulatory Visit | Attending: Urology | Admitting: Urology

## 2010-11-01 DIAGNOSIS — C649 Malignant neoplasm of unspecified kidney, except renal pelvis: Secondary | ICD-10-CM | POA: Insufficient documentation

## 2011-01-13 ENCOUNTER — Other Ambulatory Visit: Payer: Self-pay | Admitting: Dermatology

## 2011-05-31 ENCOUNTER — Other Ambulatory Visit: Payer: Self-pay

## 2011-06-05 DIAGNOSIS — C4490 Unspecified malignant neoplasm of skin, unspecified: Secondary | ICD-10-CM

## 2011-06-05 HISTORY — DX: Unspecified malignant neoplasm of skin, unspecified: C44.90

## 2011-07-04 ENCOUNTER — Encounter (HOSPITAL_COMMUNITY): Payer: Self-pay | Admitting: Cardiology

## 2011-07-04 ENCOUNTER — Inpatient Hospital Stay (HOSPITAL_COMMUNITY)
Admission: EM | Admit: 2011-07-04 | Discharge: 2011-07-06 | DRG: 310 | Disposition: A | Discharge status: Home / Self Care | Payer: Medicare Other | Attending: Internal Medicine | Admitting: Internal Medicine

## 2011-07-04 ENCOUNTER — Emergency Department (HOSPITAL_COMMUNITY): Payer: Medicare Other

## 2011-07-04 DIAGNOSIS — N289 Disorder of kidney and ureter, unspecified: Secondary | ICD-10-CM | POA: Diagnosis present

## 2011-07-04 DIAGNOSIS — I4892 Unspecified atrial flutter: Secondary | ICD-10-CM | POA: Diagnosis present

## 2011-07-04 DIAGNOSIS — I4891 Unspecified atrial fibrillation: Principal | ICD-10-CM

## 2011-07-04 DIAGNOSIS — I10 Essential (primary) hypertension: Secondary | ICD-10-CM | POA: Diagnosis present

## 2011-07-04 DIAGNOSIS — Z87891 Personal history of nicotine dependence: Secondary | ICD-10-CM

## 2011-07-04 DIAGNOSIS — I480 Paroxysmal atrial fibrillation: Secondary | ICD-10-CM | POA: Diagnosis present

## 2011-07-04 DIAGNOSIS — R197 Diarrhea, unspecified: Secondary | ICD-10-CM | POA: Diagnosis present

## 2011-07-04 DIAGNOSIS — K219 Gastro-esophageal reflux disease without esophagitis: Secondary | ICD-10-CM

## 2011-07-04 DIAGNOSIS — E785 Hyperlipidemia, unspecified: Secondary | ICD-10-CM | POA: Diagnosis present

## 2011-07-04 DIAGNOSIS — Z96659 Presence of unspecified artificial knee joint: Secondary | ICD-10-CM

## 2011-07-04 DIAGNOSIS — E876 Hypokalemia: Secondary | ICD-10-CM | POA: Diagnosis present

## 2011-07-04 DIAGNOSIS — Z85528 Personal history of other malignant neoplasm of kidney: Secondary | ICD-10-CM

## 2011-07-04 DIAGNOSIS — I2510 Atherosclerotic heart disease of native coronary artery without angina pectoris: Secondary | ICD-10-CM | POA: Diagnosis present

## 2011-07-04 DIAGNOSIS — Z8673 Personal history of transient ischemic attack (TIA), and cerebral infarction without residual deficits: Secondary | ICD-10-CM

## 2011-07-04 DIAGNOSIS — A088 Other specified intestinal infections: Secondary | ICD-10-CM | POA: Diagnosis present

## 2011-07-04 HISTORY — DX: Cerebral infarction, unspecified: I63.9

## 2011-07-04 HISTORY — DX: Atherosclerotic heart disease of native coronary artery without angina pectoris: I25.10

## 2011-07-04 HISTORY — DX: Adverse effect of unspecified anesthetic, initial encounter: T41.45XA

## 2011-07-04 HISTORY — DX: Essential (primary) hypertension: I10

## 2011-07-04 HISTORY — DX: Unspecified malignant neoplasm of skin, unspecified: C44.90

## 2011-07-04 HISTORY — DX: Unspecified atrial fibrillation: I48.91

## 2011-07-04 HISTORY — DX: Gastro-esophageal reflux disease without esophagitis: K21.9

## 2011-07-04 HISTORY — DX: Other complications of anesthesia, initial encounter: T88.59XA

## 2011-07-04 HISTORY — DX: Obstructive sleep apnea (adult) (pediatric): G47.33

## 2011-07-04 HISTORY — DX: Hyperlipidemia, unspecified: E78.5

## 2011-07-04 HISTORY — DX: Malignant neoplasm of right kidney, except renal pelvis: C64.1

## 2011-07-04 LAB — CARDIAC PANEL(CRET KIN+CKTOT+MB+TROPI)
CK, MB: 2.5 ng/mL (ref 0.3–4.0)
Total CK: 63 U/L (ref 7–232)
Troponin I: <0.3 ng/mL (ref <0.30)

## 2011-07-04 LAB — DIFFERENTIAL
Basophils Absolute: 0 10*3/uL (ref 0.0–0.1)
Basophils Relative: 0 % (ref 0–1)
Eosinophils Relative: 2 % (ref 0–5)
Lymphocytes Relative: 16 % (ref 12–46)
Monocytes Absolute: 0.9 10*3/uL (ref 0.1–1.0)
Neutro Abs: 7.4 10*3/uL (ref 1.7–7.7)

## 2011-07-04 LAB — POCT I-STAT, CHEM 8
Calcium, Ion: 1.12 mmol/L (ref 1.12–1.32)
Creatinine, Ser: 2 mg/dL — ABNORMAL HIGH (ref 0.50–1.35)
Glucose, Bld: 111 mg/dL — ABNORMAL HIGH (ref 70–99)
HCT: 42 % (ref 39.0–52.0)
Hemoglobin: 14.3 g/dL (ref 13.0–17.0)
Potassium: 3.2 mEq/L — ABNORMAL LOW (ref 3.5–5.1)

## 2011-07-04 LAB — URINALYSIS, ROUTINE W REFLEX MICROSCOPIC
Leukocytes, UA: NEGATIVE
Nitrite: NEGATIVE
Specific Gravity, Urine: 1.026 (ref 1.005–1.030)
Urobilinogen, UA: 0.2 mg/dL (ref 0.0–1.0)
pH: 5.5 (ref 5.0–8.0)

## 2011-07-04 LAB — D-DIMER, QUANTITATIVE: D-Dimer, Quant: 0.3 ug/mL-FEU (ref 0.00–0.48)

## 2011-07-04 LAB — URINE MICROSCOPIC-ADD ON

## 2011-07-04 LAB — COMPREHENSIVE METABOLIC PANEL
ALT: 29 U/L (ref 0–53)
AST: 25 U/L (ref 0–37)
Albumin: 3.3 g/dL — ABNORMAL LOW (ref 3.5–5.2)
CO2: 20 mEq/L (ref 19–32)
Calcium: 7.5 mg/dL — ABNORMAL LOW (ref 8.4–10.5)
Creatinine, Ser: 1.88 mg/dL — ABNORMAL HIGH (ref 0.50–1.35)
GFR calc non Af Amer: 35 mL/min — ABNORMAL LOW (ref >90)
Sodium: 140 mEq/L (ref 135–145)

## 2011-07-04 LAB — CBC
HCT: 40.9 % (ref 39.0–52.0)
MCHC: 33.3 g/dL (ref 30.0–36.0)
MCV: 86.8 fL (ref 78.0–100.0)
Platelets: 209 10*3/uL (ref 150–400)
RDW: 13.5 % (ref 11.5–15.5)
WBC: 10.1 10*3/uL (ref 4.0–10.5)

## 2011-07-04 LAB — MAGNESIUM: Magnesium: 1.9 mg/dL (ref 1.5–2.5)

## 2011-07-04 LAB — PROTIME-INR: INR: 1.1 (ref 0.00–1.49)

## 2011-07-04 LAB — TSH: TSH: 3.299 u[IU]/mL (ref 0.350–4.500)

## 2011-07-04 LAB — APTT: aPTT: 28 seconds (ref 24–37)

## 2011-07-04 MED ORDER — VITAMIN D3 25 MCG (1000 UT) PO CAPS: 1.0000 | ORAL_CAPSULE | Freq: Every day | ORAL | Status: DC

## 2011-07-04 MED ORDER — SODIUM CHLORIDE 0.9 % IV SOLN
INTRAVENOUS | Status: DC
  Administered 2011-07-05 – 2011-07-06 (×2): via INTRAVENOUS

## 2011-07-04 MED ORDER — ATORVASTATIN CALCIUM 10 MG PO TABS
10.0000 mg | ORAL_TABLET | Freq: Every day | ORAL | Status: DC
  Administered 2011-07-05: 10 mg via ORAL
  Filled 2011-07-04 (×2): qty 1

## 2011-07-04 MED ORDER — VITAMIN D3 25 MCG (1000 UNIT) PO TABS
1000.0000 [IU] | ORAL_TABLET | Freq: Every day | ORAL | Status: DC
  Administered 2011-07-05 – 2011-07-06 (×2): 1000 [IU] via ORAL
  Filled 2011-07-04 (×2): qty 1

## 2011-07-04 MED ORDER — DOXAZOSIN MESYLATE 2 MG PO TABS
2.0000 mg | ORAL_TABLET | Freq: Every day | ORAL | Status: DC
  Administered 2011-07-05 – 2011-07-06 (×2): 2 mg via ORAL
  Filled 2011-07-04 (×4): qty 1

## 2011-07-04 MED ORDER — POTASSIUM CHLORIDE CRYS ER 20 MEQ PO TBCR
40.0000 meq | EXTENDED_RELEASE_TABLET | Freq: Once | ORAL | Status: AC
  Administered 2011-07-04: 40 meq via ORAL
  Filled 2011-07-04 (×2): qty 2

## 2011-07-04 MED ORDER — OMEGA-3-ACID ETHYL ESTERS 1 G PO CAPS
1.0000 g | ORAL_CAPSULE | Freq: Every day | ORAL | Status: DC
  Administered 2011-07-05 – 2011-07-06 (×2): 1 g via ORAL
  Filled 2011-07-04 (×3): qty 1

## 2011-07-04 MED ORDER — RIVAROXABAN 10 MG PO TABS
20.0000 mg | ORAL_TABLET | Freq: Every day | ORAL | Status: DC
  Filled 2011-07-04: qty 2

## 2011-07-04 MED ORDER — SODIUM CHLORIDE 0.9 % IV BOLUS (SEPSIS): 1000.0000 mL | Freq: Once | INTRAVENOUS | Status: DC

## 2011-07-04 MED ORDER — ZOLPIDEM TARTRATE 5 MG PO TABS: 5.0000 mg | ORAL_TABLET | Freq: Every evening | ORAL | Status: DC | PRN

## 2011-07-04 MED ORDER — ONDANSETRON HCL 4 MG/2ML IJ SOLN: 4.0000 mg | Freq: Four times a day (QID) | INTRAMUSCULAR | Status: DC | PRN

## 2011-07-04 MED ORDER — FENOFIBRATE 160 MG PO TABS
160.0000 mg | ORAL_TABLET | Freq: Every day | ORAL | Status: DC
  Administered 2011-07-05 – 2011-07-06 (×2): 160 mg via ORAL
  Filled 2011-07-04 (×3): qty 1

## 2011-07-04 MED ORDER — DILTIAZEM HCL 100 MG IV SOLR
5.0000 mg/h | INTRAVENOUS | Status: DC
  Administered 2011-07-04: 5 mg/h via INTRAVENOUS
  Filled 2011-07-04: qty 100

## 2011-07-04 MED ORDER — ACETAMINOPHEN 325 MG PO TABS
650.0000 mg | ORAL_TABLET | ORAL | Status: DC | PRN
  Filled 2011-07-04: qty 2

## 2011-07-04 MED ORDER — NITROGLYCERIN 0.4 MG SL SUBL: 0.4000 mg | SUBLINGUAL_TABLET | SUBLINGUAL | Status: DC | PRN

## 2011-07-04 MED ORDER — RIVAROXABAN 15 MG PO TABS
15.0000 mg | ORAL_TABLET | Freq: Every day | ORAL | Status: DC
  Administered 2011-07-04: 15 mg via ORAL
  Filled 2011-07-04 (×2): qty 1

## 2011-07-04 MED ORDER — METOPROLOL TARTRATE 1 MG/ML IV SOLN
2.5000 mg | Freq: Once | INTRAVENOUS | Status: AC
Start: 2011-07-04 — End: 2011-07-04
  Administered 2011-07-04: 2.5 mg via INTRAVENOUS
  Filled 2011-07-04: qty 5

## 2011-07-04 MED ORDER — METOPROLOL SUCCINATE ER 25 MG PO TB24
25.0000 mg | ORAL_TABLET | Freq: Every day | ORAL | Status: DC
  Administered 2011-07-04 – 2011-07-06 (×3): 25 mg via ORAL
  Filled 2011-07-04 (×6): qty 1

## 2011-07-04 MED ORDER — DILTIAZEM HCL 25 MG/5ML IV SOLN
10.0000 mg | Freq: Once | INTRAVENOUS | Status: AC
  Administered 2011-07-04: 10 mg via INTRAVENOUS
  Filled 2011-07-04: qty 5

## 2011-07-04 MED ORDER — SODIUM CHLORIDE 0.9 % IV BOLUS (SEPSIS)
1000.0000 mL | Freq: Once | INTRAVENOUS | Status: AC
  Administered 2011-07-04: 1000 mL via INTRAVENOUS

## 2011-07-04 NOTE — ED Provider Notes (Signed)
History     CSN: [id number]  Arrival date & time 07/04/11  1255   First MD Initiated Contact with Patient 07/04/11 1258      Chief Complaint  Patient presents with  . Atrial Fibrillation    (Consider location/radiation/quality/duration/timing/severity/associated sxs/prior treatment) HPI Comments: Patient arrives via EMS from doctor's office with new-onset atrial fibrillation and tachycardia. He went to the doctor because he was feeling poorly his has had diarrhea and weakness for the past 3 days.  He thought it was due to eating some bad food this weekend. He denies any chest pain, abdominal pain, vomiting, nausea. No shortness of breath. He had a cardiac catheterization 2005 showed minimal coronary disease. He has no history of atrial fibrillation. Denies any leg pain or swelling.  The history is provided by the patient and the EMS personnel.    No past medical history on file.  No past surgical history on file.  No family history on file.  History  Substance Use Topics  . Smoking status: Not on file  . Smokeless tobacco: Not on file  . Alcohol Use: Not on file      Review of Systems  Constitutional: Positive for fatigue. Negative for fever, activity change and appetite change.  HENT: Negative for congestion and rhinorrhea.   Eyes: Negative for visual disturbance.  Respiratory: Negative for cough, chest tightness and shortness of breath.   Cardiovascular: Positive for palpitations. Negative for chest pain.  Gastrointestinal: Positive for diarrhea. Negative for nausea, vomiting and abdominal pain.  Genitourinary: Negative for dysuria and hematuria.  Musculoskeletal: Negative for back pain.  Skin: Negative for rash.  Neurological: Positive for dizziness, weakness and light-headedness.    Allergies  Review of patient's allergies indicates no known allergies.  Home Medications   Current Outpatient Rx  Name Route Sig Dispense Refill  . ATORVASTATIN CALCIUM 10 MG PO  TABS Oral Take 10 mg by mouth daily.    Marland Kitchen VITAMIN D3 1000 UNITS PO CAPS Oral Take 1 capsule by mouth daily.    . CHOLINE FENOFIBRATE 135 MG PO CPDR Oral Take 135 mg by mouth daily.    . ASPIRIN-DIPYRIDAMOLE ER 25-200 MG PO CP12 Oral Take 1 capsule by mouth 2 (two) times daily.    Marland Kitchen DOXAZOSIN MESYLATE 2 MG PO TABS Oral Take 2 mg by mouth daily.    Marland Kitchen OLMESARTAN MEDOXOMIL-HCTZ 40-25 MG PO TABS Oral Take 1 tablet by mouth daily.    . OMEGA-3-ACID ETHYL ESTERS 1 G PO CAPS Oral Take 1 g by mouth daily.      BP 93/47  Pulse 118  Temp(Src) 98.3 F (36.8 C) (Oral)  Resp 18  SpO2 100%  Physical Exam  Constitutional: He is oriented to person, place, and time. He appears well-developed and well-nourished. No distress.  HENT:  Head: Normocephalic and atraumatic.  Mouth/Throat: Oropharynx is clear and moist. No oropharyngeal exudate.  Eyes: Conjunctivae are normal. Pupils are equal, round, and reactive to light.  Neck: Normal range of motion.  Cardiovascular: Normal rate and normal heart sounds.   No murmur heard.      Irregular rhythm  Pulmonary/Chest: Effort normal and breath sounds normal. No respiratory distress.  Abdominal: Soft. There is no tenderness. There is no rebound and no guarding.  Musculoskeletal: Normal range of motion. He exhibits no edema and no tenderness.  Neurological: He is alert and oriented to person, place, and time. No cranial nerve deficit.  Skin: Skin is warm.    ED Course  Procedures (including critical care time)  Labs Reviewed  COMPREHENSIVE METABOLIC PANEL - Abnormal; Notable for the following:    Potassium 3.2 (*)    Glucose, Bld 109 (*)    BUN 41 (*)    Creatinine, Ser 1.88 (*)    Calcium 7.5 (*)    Albumin 3.3 (*)    Alkaline Phosphatase 29 (*)    Total Bilirubin 0.2 (*)    GFR calc non Af Amer 35 (*)    GFR calc Af Amer 40 (*)    All other components within normal limits  URINALYSIS, ROUTINE W REFLEX MICROSCOPIC - Abnormal; Notable for the  following:    APPearance HAZY (*)    Bilirubin Urine SMALL (*)    Protein, ur >300 (*)    All other components within normal limits  POCT I-STAT, CHEM 8 - Abnormal; Notable for the following:    Potassium 3.2 (*)    BUN 42 (*)    Creatinine, Ser 2.00 (*)    Glucose, Bld 111 (*)    All other components within normal limits  URINE MICROSCOPIC-ADD ON - Abnormal; Notable for the following:    Bacteria, UA FEW (*)    Casts HYALINE CASTS (*) GRANULAR CAST   All other components within normal limits  CBC  DIFFERENTIAL  PROTIME-INR  APTT  CARDIAC PANEL(CRET KIN+CKTOT+MB+TROPI)  POCT I-STAT TROPONIN I   Dg Chest Portable 1 View  07/04/2011  *RADIOLOGY REPORT*  Clinical Data: Atrial fibrillation.  Severe weakness.  PORTABLE CHEST - 1 VIEW  Comparison: Chest x-ray 11/01/2010.  Findings: Lung volumes are low.  No consolidative airspace disease. No definite pleural effusions.  Pulmonary vasculature and the cardiomediastinal silhouette are within normal limits.  Surgical clips are seen projecting over the left side of the thoracic inlet, likely from prior thyroid surgery.  IMPRESSION: 1.  No radiographic evidence of acute cardiopulmonary disease.  Original Report Authenticated By: Florencia Reasons, M.D.     No diagnosis found.    MDM  Wide-complex tachycardia that is irregular. Suspect nature fibrillation with rapid ventricular response in setting of left bundle branch block. No chest pain or shortness of breath. On aggrenox.  Atrial fibrillation with RVR.   IV cardizem and gtt. BP low but responding to fluid.  Admission discussed with [facility].    Date: 07/04/2011  Rate: 146  Rhythm: normal sinus rhythm  QRS Axis: normal  Intervals: normal  ST/T Wave abnormalities: nonspecific ST/T changes  Conduction Disutrbances:none  Narrative Interpretation:   Old EKG Reviewed: unchanged  CRITICAL CARE Performed by: Glynn Octave   Total critical care time: 35  Critical  care time was exclusive of separately billable procedures and treating other patients.  Critical care was necessary to treat or prevent imminent or life-threatening deterioration.  Critical care was time spent personally by me on the following activities: development of treatment plan with patient and/or surrogate as well as nursing, discussions with consultants, evaluation of patient's response to treatment, examination of patient, obtaining history from patient or surrogate, ordering and performing treatments and interventions, ordering and review of laboratory studies, ordering and review of radiographic studies, pulse oximetry and re-evaluation of patient's condition.      Glynn Octave, MD 07/04/11 [phone]

## 2011-07-04 NOTE — H&P (Signed)
History and Physical  Patient ID: BURDELL PEED MRN: [id number], SOB: Aug 06, 1940 71 y.o. Date of Encounter: 07/04/2011, 3:21 PM  Primary Physician: Leo Grosser, MD Primary Cardiologist: Dr. Jens Som in 2010 for preop eval  Chief Complaint: A.Fib w/ RVR  HPI: 71 y.o. 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  Last cath in 2005 showed mild nonobstructive CAD and EF 55%. Echo 2012 showed mod LVH, EF 65%, no RWMAs, no significant valvular abnls. Reports doing well until Sunday when he work up with diarrhea which continued all day and into Monday. He felt dehydrated, weak, dizzy, and "almost passed out" on Sunday. He had a max temp 100.0 and had chills on Sunday. He continued to have diarrhea and feel poorly so he visited his PCP this morning where an EKG showed A.Fib w/ RVR. He denies h/o a.fib or other arrhythmias and denies feeling his heart race or irregular heart beat. Denies vomiting, hematochezia, melena, abd pain, chest pain, sob, palpitations, or syncope. His wife and siblings had vomiting and diarrhea the week before. He has OSA diagnosed about 2years ago, but does not wear a CPAP. He drinks about 3-4 cups of coffee each morning. No alcohol or illicit drugs.  In the ED, EKG revealed A.Fib 146bpm, RBBB, LAFB, LVH. CXR was without acute cardiopulmonary abnormalities. Labs significant for normal cardiac enzymes, K+ 3.2, BUN/Crt 42/2, WBC 10.1. Given IVF, 10mg  Cardizem bolus and 2.5mg  IV Lopressor and initiated on Cardizem drip. Converted to NSR.  Past Medical History  Diagnosis Date  . HTN (hypertension)   . HLD (hyperlipidemia)   . CVA (cerebral infarction) 2007  . Renal cell cancer, right 2000    s/p partial neprhectomy   . OSA (obstructive sleep apnea)   . CAD (coronary  artery disease)     mild nonobstructive by cath '05, EF 65% by echo '12   08/2010 - 2D Echocardiogram Study Conclusions: - Left ventricle: The cavity size was normal. Wall thickness was increased in a pattern of moderate LVH. The estimated ejection fraction was 65%. Wall motion was normal; there were no regional wall motion abnormalities. - Right ventricle: The cavity size was normal. Systolic function was normal.  Cardiac Cath 2005 1. Left main trunk: Short, medium caliber vessel with mild irregularities.  2. LAD: This is a large caliber vessel that provides a large diagonal branch in the mid section. The LAD has mild disease of 30% in the mid section.  3. Left circumflex artery: Large caliber vessel that provides two marginal branches. The AV circumflex has mild disease of 20-30%. The marginal branch has mild disease of 20%.  4. Right coronary artery is dominant. This is a large caliber vessel that provides posterior descending artery and two posterior ventricular  branches. The right coronary artery has moderate focal narrowing of 30% in the mid section. The remainder of the vessel has mild diffuse disease of 20%.  LEFT VENTRICULOGRAPHY:  1. Normal end-systolic and end-diastolic dimensions.  2. Overall left ventricular function is well preserved.  3. Ejection fraction greater than 55%.  4. No mitral regurgitation.  5. LV pressure is 150/10.  6. Aortic pressure is 150/80.  7. LVEDP equals 20.  8. Intravascular ultrasound of the left circumflex artery shows it to be large caliber  vessel with mild concentric plaque and mild __________but no critical stenosis.  ASSESSMENT AND PLAN: Mr. Masi is a 70 year old gentleman with stable coronary artery disease and well preserved LV function. Continued medical therapy will be pursued.  Surgical History:  Past Surgical History  Procedure Date  . Replacement total knee 2010    Left  . Thyroidectomy, partial   . Partial nephrectomy 2000    right     Home Meds: Medication Sig  atorvastatin (LIPITOR) 10 MG tablet Take 10 mg by mouth daily.  Cholecalciferol (VITAMIN D3) 1000 UNITS CAPS Take 1 capsule by mouth daily.  Choline Fenofibrate (TRILIPIX) 135 MG capsule Take 135 mg by mouth daily.  dipyridamole-aspirin (AGGRENOX) 25-200 MG per 12 hr capsule Take 1 capsule by mouth 2 (two) times daily.  doxazosin (CARDURA) 2 MG tablet Take 2 mg by mouth daily.  olmesartan-hydrochlorothiazide (BENICAR HCT) 40-25 MG per tablet Take 1 tablet by mouth daily.  omega-3 acid ethyl esters (LOVAZA) 1 G capsule Take 1 g by mouth daily.    Allergies: No Known Allergies  Social History  . Marital Status: Married   Occupational History  . Retired   Social History Main Topics  . Smoking status: Former Smoker    Types: Cigarettes    Quit date: 04/04/1977  . Alcohol Use: No  . Drug Use: No   Social History Narrative   Retired. Lives in [place]     Family History  Problem Relation Age of Onset  . Heart failure Mother     died at 46    Review of Systems: General: (+) chills, fever; negative for night sweats or weight changes.  Cardiovascular: negative for chest pain, shortness of breath, dyspnea on exertion, edema, orthopnea, palpitations, paroxysmal nocturnal dyspnea  Dermatological: negative for rash Respiratory: negative for cough or wheezing Urologic: negative for hematuria Abdominal: As per HPI Neurologic: negative for visual changes, syncope All other systems reviewed and are otherwise negative except as noted above.  Labs:   Component Value Date   WBC 10.1 07/04/2011   HGB 14.3 07/04/2011   HCT 42.0 07/04/2011   MCV 86.8 07/04/2011   PLT 209 07/04/2011    Lab 07/04/11 1328 07/04/11 1306  NA 143 --  K 3.2* --  CL 112 --  CO2 -- 20  BUN 42* --  CREATININE 2.00* --  CALCIUM -- 7.5*  PROT -- 6.4  BILITOT -- 0.2*  ALKPHOS -- 29*  ALT -- 29  AST -- 25  GLUCOSE 111* --   Basename 07/04/11 1306  CKTOTAL 63  CKMB 2.5   TROPONINI <0.30   Radiology/Studies:   07/04/2011 - CXR Findings: Lung volumes are low.  No consolidative airspace disease. No definite pleural effusions.  Pulmonary vasculature and the cardiomediastinal silhouette are within normal limits.  Surgical clips are seen projecting over the left side of the thoracic inlet, likely from prior thyroid surgery.  IMPRESSION: 1.  No radiographic evidence of acute cardiopulmonary disease    EKG: 07/04/11 @ 1307 - A. Fib 146bpm, RBBB, LAFB, LVH  Physical Exam: Blood pressure 93/47, pulse 118, temperature 98.3 F (36.8 C), temperature source Oral, resp. rate 18, SpO2 100.00%. General: Pleasant elderly overweight white male in no acute distress. Head: Normocephalic, atraumatic, sclera non-icteric, nares are without discharge Neck: Supple. Negative for carotid bruits. JVD not elevated. Lungs: Clear bilaterally to auscultation without wheezes, rales, or rhonchi. Breathing is unlabored. Heart: regular irregular with S1 S2. No murmurs, rubs, or gallops appreciated. Abdomen: Soft, non-tender,  non-distended with normoactive bowel sounds. No rebound/guarding. No obvious abdominal masses. Msk:  Strength and tone appear normal for age. Extremities: Trace BLE edema. No clubbing or cyanosis. Distal pedal pulses are intact and equal bilaterally. Neuro: Alert and oriented X 3. Moves all extremities spontaneously. Psych:  Responds to questions appropriately with a normal affect.    ASSESSMENT AND PLAN:  71 y.o. 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  1. Atrial Fibrillation w RVR: No prior h/o A. Fib. He does have OSA and does not wear CPAP. Has likely had viral gastroenteritis that precipitated his a.fib, although unsure of onset of A.fib as he is  asymptomatic. CHADS2 score 3 (HTN & Stroke). Will need lifelong anticoagulation with xarelto. Will need to stop aggrenox. Admit for anticoagulation. Check Echo and Ddimer. He converted to NSR. Will stop cardizem and initiate toprol 25mg  daily.   2. Diarrhea: Likely a viral gastroenteritis. Has not had any further diarrhea since around 6 this morning. Cont hydration  3. Hypokalemia: K+ 3.2 likely from diarrhea. Supplement  4. HTN: Mild hypotension on cardizem.  Hold Benicar. Cont cardura and add toprol  5. HLD: Cont statin  6. CAD: h/o mild nonobstructive CAD by cath '05. Treated medically. No anginal symptoms. Cardiac enzymes negative, no acute ischemic EKG changes.   Signed, HOPE, JESSICA PA-C 07/04/2011, 3:21 PM  Cardiology Attending  Patient seen and independently examined. I have reviewed the history, physical exam, assess and plan. 72 yo man with a 5 day h/o diarrhea, increased caffiene intake now admitted with atrial fib with an RVR. He denies palpitation but noted that he had felt weak over the past few weeks. No syncope or chest pain. He has spontaneously gone back to NSR.  Plan to admit, start Xarelto (he is going on a 2 week fishing trip in 2 weeks), replete K, check a 2 D echo, check TSH and D-dimer, and ask him to reduce his caffiene intake. Likely discharge in the next 24 hours.  Lewayne Bunting, M.D.

## 2011-07-04 NOTE — ED Notes (Signed)
Pt brought in by EMS from PCP for new onset of atrial fib. Pt has had c/o weakness/dizzy/diarrhea. Pt has hx of HTN, CAD, Cardiac Cath x2, CVA

## 2011-07-04 NOTE — ED Notes (Signed)
Cardiology at bedside.

## 2011-07-04 NOTE — ED Notes (Addendum)
Remains in A fib on monitor, HR 90-110. cardizem gtt continues at 5mg /hr.  [facility] PA at bedside.

## 2011-07-05 DIAGNOSIS — I517 Cardiomegaly: Secondary | ICD-10-CM

## 2011-07-05 DIAGNOSIS — E876 Hypokalemia: Secondary | ICD-10-CM | POA: Diagnosis present

## 2011-07-05 DIAGNOSIS — R197 Diarrhea, unspecified: Secondary | ICD-10-CM | POA: Diagnosis present

## 2011-07-05 LAB — CBC
HCT: 36.8 % — ABNORMAL LOW (ref 39.0–52.0)
Hemoglobin: 12.3 g/dL — ABNORMAL LOW (ref 13.0–17.0)
MCH: 29.1 pg (ref 26.0–34.0)
MCV: 87.2 fL (ref 78.0–100.0)
RBC: 4.22 MIL/uL (ref 4.22–5.81)
WBC: 8.9 10*3/uL (ref 4.0–10.5)

## 2011-07-05 LAB — BASIC METABOLIC PANEL
BUN: 33 mg/dL — ABNORMAL HIGH (ref 6–23)
CO2: 19 mEq/L (ref 19–32)
Chloride: 111 mEq/L (ref 96–112)
Creatinine, Ser: 1.46 mg/dL — ABNORMAL HIGH (ref 0.50–1.35)
Glucose, Bld: 100 mg/dL — ABNORMAL HIGH (ref 70–99)

## 2011-07-05 LAB — CLOSTRIDIUM DIFFICILE BY PCR: Toxigenic C. Difficile by PCR: NEGATIVE

## 2011-07-05 MED ORDER — LOPERAMIDE HCL 2 MG PO CAPS
4.0000 mg | ORAL_CAPSULE | ORAL | Status: DC | PRN
  Administered 2011-07-05 – 2011-07-06 (×2): 4 mg via ORAL
  Filled 2011-07-05: qty 2
  Filled 2011-07-05 (×2): qty 1

## 2011-07-05 MED ORDER — POTASSIUM CHLORIDE CRYS ER 20 MEQ PO TBCR
30.0000 meq | EXTENDED_RELEASE_TABLET | Freq: Three times a day (TID) | ORAL | Status: DC
  Administered 2011-07-05: 30 meq via ORAL
  Filled 2011-07-05 (×3): qty 1

## 2011-07-05 MED ORDER — RIVAROXABAN 10 MG PO TABS
20.0000 mg | ORAL_TABLET | Freq: Every day | ORAL | Status: DC
  Administered 2011-07-05: 20 mg via ORAL
  Filled 2011-07-05 (×2): qty 2

## 2011-07-05 NOTE — Progress Notes (Signed)
UR Completed. Simmons, Jordani Nunn F [phone]  

## 2011-07-05 NOTE — Progress Notes (Signed)
   CARE MANAGEMENT NOTE 07/05/2011  Patient:  Henry Martin, Henry Martin   Account Number:  [id number]  Date Initiated:  07/05/2011  Documentation initiated by:  GRAVES-BIGELOW,Kjerstin Abrigo  Subjective/Objective Assessment:   Pt admitted with Afib with RVR. Initiated on cardizem gtt and converted to NSR. Plan for home on xarelto when stable- hopefully am.     Action/Plan:   CM will start a benefits check for co pay cost. Pt uses [facility] and they have 10 tablets available.  Will be able to order the rest and have in stock by next day. Prior authorization is needed for medication. MD please call [phone]. Thanks   Anticipated DC Date:  07/06/2011   Anticipated DC Plan:  HOME/SELF CARE      DC Planning Services  CM consult      Choice offered to / List presented to:             Status of service:  Completed, signed off Medicare Important Message given?   (If response is "NO", the following Medicare IM given date fields will be blank) Date Medicare IM given:   Date Additional Medicare IM given:    Discharge Disposition:  HOME/SELF CARE  Per UR Regulation:    If discussed at Long Length of Stay Meetings, dates discussed:    Comments:  07-05-11 1116 Tomi Bamberger, RN, BSN [phone] CM will make pt aware once benefits check is complete.

## 2011-07-05 NOTE — Progress Notes (Signed)
I called his insurance company and they approved his xarelto prescription for one year.

## 2011-07-05 NOTE — Progress Notes (Signed)
Subjective:  Remains in NSR.  Still having diarrhea.  Has had 3 loose stools already this am. Potassium still low. DDimer normal.  TSH normal. 2D echo pending.  Objective:  Vital Signs in the last 24 hours: Temp:  [98.2 F (36.8 C)-98.9 F (37.2 C)] 98.2 F (36.8 C) (05/01 0500) Pulse Rate:  [77-127] 81  (05/01 0500) Resp:  [9-20] 20  (05/01 0500) BP: (74-125)/(40-92) 125/62 mmHg (05/01 0629) SpO2:  [84 %-100 %] 97 % (05/01 0500) Weight:  [113.082 kg (249 lb 4.8 oz)] 113.082 kg (249 lb 4.8 oz) (04/30 1750)  Intake/Output from previous day:   Intake/Output from this shift:       . atorvastatin  10 mg Oral Daily  . cholecalciferol  1,000 Units Oral Daily  . diltiazem  10 mg Intravenous Once  . doxazosin  2 mg Oral Daily  . fenofibrate  160 mg Oral Daily  . metoprolol  2.5 mg Intravenous Once  . metoprolol succinate  25 mg Oral Daily  . omega-3 acid ethyl esters  1 g Oral Daily  . potassium chloride  40 mEq Oral Once  . rivaroxaban  15 mg Oral Q supper  . sodium chloride  1,000 mL Intravenous Once  . sodium chloride  1,000 mL Intravenous Once  . DISCONTD: rivaroxaban  20 mg Oral Q supper  . DISCONTD: Vitamin D3  1 capsule Oral Daily      . sodium chloride 75 mL/hr (07/04/11 1559)  . DISCONTD: diltiazem (CARDIZEM) infusion Stopped (07/04/11 1550)    Physical Exam: The patient appears to be in no distress.  Head and neck exam reveals that the pupils are equal and reactive.  The extraocular movements are full.  There is no scleral icterus.  Mouth and pharynx are benign.  No lymphadenopathy.  No carotid bruits.  The jugular venous pressure is normal.  Thyroid is not enlarged or tender.  Chest is clear to percussion and auscultation.  No rales or rhonchi.  Expansion of the chest is symmetrical.  Heart reveals no abnormal lift or heave.  First and second heart sounds are normal.  There is no murmur gallop rub or click.  The abdomen is soft and nontender.  Bowel sounds  are normoactive.  There is no hepatosplenomegaly or mass.  There are no abdominal bruits.  Extremities reveal no phlebitis or edema.  Pedal pulses are good.  There is no cyanosis or clubbing.  Neurologic exam is normal strength and no lateralizing weakness.  No sensory deficits.  Integument reveals no rash  Lab Results:  Basename 07/05/11 0605 07/04/11 1328 07/04/11 1306  WBC 8.9 -- 10.1  HGB 12.3* 14.3 --  PLT 200 -- 209    Basename 07/05/11 0605 07/04/11 1328 07/04/11 1306  NA 137 143 --  K 3.3* 3.2* --  CL 111 112 --  CO2 19 -- 20  GLUCOSE 100* 111* --  BUN 33* 42* --  CREATININE 1.46* 2.00* --    Basename 07/04/11 1306  TROPONINI <0.30   Hepatic Function Panel  Basename 07/04/11 1306  PROT 6.4  ALBUMIN 3.3*  AST 25  ALT 29  ALKPHOS 29*  BILITOT 0.2*  BILIDIR --  IBILI --   No results found for this basename: CHOL in the last 72 hours No results found for this basename: PROTIME in the last 72 hours  Imaging: Dg Chest Portable 1 View  07/04/2011  *RADIOLOGY REPORT*  Clinical Data: Atrial fibrillation.  Severe weakness.  PORTABLE CHEST - 1  VIEW  Comparison: Chest x-ray 11/01/2010.  Findings: Lung volumes are low.  No consolidative airspace disease. No definite pleural effusions.  Pulmonary vasculature and the cardiomediastinal silhouette are within normal limits.  Surgical clips are seen projecting over the left side of the thoracic inlet, likely from prior thyroid surgery.  IMPRESSION: 1.  No radiographic evidence of acute cardiopulmonary disease.  Original Report Authenticated By: Florencia Reasons, M.D.    Cardiac Studies: EKG shows NSR with bifascicular block Assessment/Plan:  Patient Active Hospital Problem List: Atrial fib/flutter, transient (07/04/2011)   Assessment: Holding in NSR.   Plan: Continue metoprolol, xarelto.  Await echo Diarrhea (07/05/2011)   Assessment: States he went to a church supper Saturday night and ate 6 deviled eggs. Explosive onset  of diarrhea Sunday morning. He suspects "food poisoning".  Plan: Check for C. Difficile.  Use Imodium as necessary for control of diarrhea. Hypokalemia, gastrointestinal losses (07/05/2011)   Assessment: K+ 3.3  Still very weak from diarrhea.   Plan: Continue K+ repletion. Continue IV fluids today.            Hopefully home in am   LOS: 1 day    Henry Martin 07/05/2011, 8:42 AM

## 2011-07-05 NOTE — Progress Notes (Signed)
*  PRELIMINARY RESULTS* Echocardiogram 2D Echocardiogram has been performed.  Katheren Puller 07/05/2011, 10:35 AM

## 2011-07-06 LAB — BASIC METABOLIC PANEL
BUN: 18 mg/dL (ref 6–23)
CO2: 20 mEq/L (ref 19–32)
Chloride: 112 mEq/L (ref 96–112)
Creatinine, Ser: 0.99 mg/dL (ref 0.50–1.35)
Glucose, Bld: 93 mg/dL (ref 70–99)

## 2011-07-06 MED ORDER — METOPROLOL SUCCINATE ER 25 MG PO TB24: 25.0000 mg | ORAL_TABLET | Freq: Every day | ORAL | Status: DC

## 2011-07-06 MED ORDER — RIVAROXABAN 20 MG PO TABS: 20.0000 mg | ORAL_TABLET | Freq: Every day | ORAL | Status: DC

## 2011-07-06 NOTE — Discharge Summary (Signed)
See progress notes Henry Martin  

## 2011-07-06 NOTE — Progress Notes (Signed)
@   Subjective:  Denies CP or dyspnea; diarrhea improving   Objective:  Filed Vitals:   07/05/11 0943 07/05/11 1410 07/05/11 2122 07/06/11 0529  BP: 127/60 120/71 149/80 137/66  Pulse: 88 77 72 76  Temp:  98.1 F (36.7 C) 98.1 F (36.7 C) 98 F (36.7 C)  TempSrc:  Oral Oral Oral  Resp:  20 20 20   Height:      Weight:      SpO2:  96% 95% 98%    Intake/Output from previous day:  Intake/Output Summary (Last 24 hours) at 07/06/11 0759 Last data filed at 07/05/11 1822  Gross per 24 hour  Intake    900 ml  Output      0 ml  Net    900 ml    Physical Exam: Physical exam: Well-developed well-nourished in no acute distress.  Skin is warm and dry.  HEENT is normal.  Neck is supple. Chest is clear to auscultation with normal expansion.  Cardiovascular exam is regular rate and rhythm.  Abdominal exam nontender or distended. No masses palpated. Extremities show no edema. neuro grossly intact    Lab Results: Basic Metabolic Panel:  Basename 07/06/11 0514 07/05/11 0605 07/04/11 1557  NA 139 137 --  K 3.3* 3.3* --  CL 112 111 --  CO2 20 19 --  GLUCOSE 93 100* --  BUN 18 33* --  CREATININE 0.99 1.46* --  CALCIUM 7.3* 7.3* --  MG -- -- 1.9  PHOS -- -- --   CBC:  Basename 07/05/11 0605 07/04/11 1328 07/04/11 1306  WBC 8.9 -- 10.1  NEUTROABS -- -- 7.4  HGB 12.3* 14.3 --  HCT 36.8* 42.0 --  MCV 87.2 -- 86.8  PLT 200 -- 209   Cardiac Enzymes:  Basename 07/04/11 1306  CKTOTAL 63  CKMB 2.5  CKMBINDEX --  TROPONINI <0.30     Assessment/Plan:  1) PAF; patient remains in sinus rhythm; continue xeralto and toprol; LV function normal. 2) Diarrhea - improving; probable viral gastroenteritis. 3) Renal insufficiency (acute) - related to dehydration and diarrhea; improved with hydration. 4) Hypertension- Continue present BP meds at DC.  DC today; check BMET and CBC on 5/9; FU with PA on 5/9. FU with me in 4-6 weeks. If diarrhea does not improve, fu with his primary  care. >30 min PA and physician time D2  Olga Millers 07/06/2011, 7:59 AM

## 2011-07-06 NOTE — Discharge Summary (Signed)
Discharge Summary   Patient ID: RIDDICK NUON MRN: [id number], DOB/AGE: September 13, 1940 71 y.o.  Primary MD: Leo Grosser, MD Primary Cardiologist: Olga Millers MD  Admit date: 07/04/2011 D/C date:     07/06/2011      Primary Discharge Diagnoses:  1. Atrial Fibrillation w/ RVR, newly diagnosed this admission  - Spontaneously converted to NSR  - Initiated on xarelto and toprol  - Echo showed normal LV size with mild LV hypertrophy, normal LV systolic function, EF 55-60%, moderate diastolic dysfunction. mormal RV size and systolic function, no significant valvular abnormalities  2. Viral Gastroenteritis  - C.Diff negative, diarrhea improving  - F/u w/ PCP if continues or worsens  3. Acute Renal Insufficiency  - Due to dehydration in the setting of #2, Resolved  - F/u BMET  Secondary Discharge Diagnoses:  1. HTN 2. HLD 3. Mild nonobstructive CAD by cath ,05 4. CVA - 2007; Aggrenox dc'd as he was initiated on Xarelto 5. OSA - not on CPAP 6. Renal Cell CA s/p right partial nephrectomy 2000 7. Left total Knee replacement 2010 8. Partial thyroidectomy  Allergies Allergies  Allergen Reactions  . Contrast Media (Iodinated Diagnostic Agents) Other (See Comments)    "Isovue"; "blisters; all down my legs"    Diagnostic Studies/Procedures:   07/05/11 - 2D Echocardiogram Study Conclusions:  - Left ventricle: The cavity size was normal. Wall thickness was increased in a pattern of mild LVH. Systolic function was normal. The estimated ejection fraction was in the range of 55% to 60%. Wall motion was normal; there were no regional wall motion abnormalities. Features are consistent with a pseudonormal left ventricular filling pattern, with concomitant abnormal relaxation and increased filling pressure (grade 2 diastolic dysfunction). - Aortic valve: There was no stenosis. - Aorta: Mildly dilated aortic root. Aortic root dimension: 40mm (ED). - Mitral valve: Trivial  regurgitation. - Left atrium: The atrium was at the upper limits of normal in size. - Right ventricle: The cavity size was normal. Systolic function was normal. - Pulmonary arteries: PA peak pressure: 26mm Hg (S). - Inferior vena cava: The vessel was normal in size; the respirophasic diameter changes were in the normal range (= 50%); findings are consistent with normal central venous pressure. Impressions: Normal LV size with mild LV hypertrophy. Normal LV systolic function, EF 55-60%. Moderate diastolic dysfunction.  Normal RV size and systolic function. No significant valvular abnormalities.  History of Present Illness: 71 y.o. Martin w/ PMHx significant for HTN, HLD, CVA, Renal Cell CA, OSA, and mild nonobstructive CAD (by cath '05) who presented to his PCP w/ c/o diarrhea and weakness and found to be in A.Fib w/ RVR for which he was transferred [facility] on 07/04/2011.  Last cath in 2005 showed mild nonobstructive CAD and EF 55%. Echo 2012 showed mod LVH, EF 65%, no RWMAs, no significant valvular abnls. Reports doing well until Sunday when he work up with diarrhea which continued all day and into Monday. He felt dehydrated, weak, dizzy, and "almost passed out" on Sunday. He had a max temp 100.0 and had chills on Sunday. He continued to have diarrhea and feel poorly so he visited his PCP the morning of presentation where an EKG showed A.Fib w/ RVR. He denied h/o a.fib or other arrhythmias and denied feeling his heart race or irregular heart beat. Denied vomiting, hematochezia, melena, abd pain, chest pain, sob, palpitations, or syncope. His wife and siblings had vomiting and diarrhea the week before. He has OSA diagnosed about  2years ago, but does not wear a CPAP. He drinks about 3-4 cups of coffee each morning. No alcohol or illicit drugs.  Hospital Course: In the ED, EKG revealed A.Fib 146bpm, RBBB, LAFB, LVH. CXR was without acute cardiopulmonary abnormalities. Labs were significant for  normal cardiac enzymes, K+ 3.2, BUN/Crt 42/2, WBC 10.1. He was given IVF, 10mg  Cardizem bolus and 2.5mg  IV Lopressor and initiated on Cardizem drip with conversion to NSR. Potassium was supplemented, he was initiated on xarelto and toprol and admitted for further evaluation and treatment.  DDimer and TSH were normal. Echocardiogram was completed on 07/05/11 showing mild LVH, nl LV/RV size and function, EF 55-60%, no RWMAs, grade 2 diastolic dysfunction, mildly dilated aortic root (40mm). He remained in NSR. His renal function improved with hydration. He had further diarrhea for which c.diff was checked and he was given immodium. C.Diff was negative. His diarrhea began to improve. If it continues or worsens he is to follow up with his PCP.  He was encouraged to start wearing his CPAP. His aggrenox was stopped due to the initiation of xarelto. Benicar was stopped and cardura continued.  He was seen and evaluated by Dr. Jens Som who felt he was stable for discharge home with plans for follow up as scheduled below.  Discharge Vitals: Blood pressure 134/63, pulse 78, temperature 98 F (36.7 C), temperature source Oral, resp. rate 20, height 6' (1.829 m), weight 249 lb 4.8 oz (113.082 kg), SpO2 98.00%.  Labs: Component Value Date   WBC 8.9 07/05/2011   HGB 12.3* 07/05/2011   HCT 36.8* 07/05/2011   MCV 87.2 07/05/2011   PLT 200 07/05/2011    Lab 07/06/11 0514 07/04/11 1306  NA 139 --  K 3.3* --  CL 112 --  CO2 20 --  BUN 18 --  CREATININE 0.99 --  CALCIUM 7.3* --  PROT -- 6.4  BILITOT -- 0.2*  ALKPHOS -- 29*  ALT -- 29  AST -- 25  GLUCOSE 93 --   Basename 07/04/11 1306  CKTOTAL 63  CKMB 2.5  TROPONINI <0.30     07/04/2011 15:57  TSH 3.299   Component Value Date   DDIMER 0.30 07/04/2011     07/05/2011 14:35  C difficile by pcr NEGATIVE     Discharge Medications   Medication List  As of 07/06/2011 10:11 AM   STOP taking these medications         dipyridamole-aspirin 25-200 MG per 12 hr  capsule      olmesartan-hydrochlorothiazide 40-25 MG per tablet         TAKE these medications         atorvastatin 10 MG tablet   Commonly known as: LIPITOR   Take 10 mg by mouth daily.      doxazosin 2 MG tablet   Commonly known as: CARDURA   Take 2 mg by mouth daily.      metoprolol succinate 25 MG 24 hr tablet   Commonly known as: TOPROL-XL   Take 1 tablet (25 mg total) by mouth daily.      omega-3 acid ethyl esters 1 G capsule   Commonly known as: LOVAZA   Take 1 g by mouth daily.      Rivaroxaban 20 MG Tabs   Take 20 mg by mouth daily with supper.      TRILIPIX 135 MG capsule   Generic drug: Choline Fenofibrate   Take 135 mg by mouth daily.      Vitamin D3 1000 UNITS  Caps   Take 1 capsule by mouth daily.            Disposition   Discharge Orders    Future Appointments: Provider: Department: Dept Phone: Center:   07/13/2011 8:50 AM Beatrice Lecher, PA [facility] [phone] [facility]   07/13/2011 9:40 AM [facility] [phone] [facility]   08/03/2011 9:00 AM Lewayne Bunting, MD [facility] [phone] [facility]     Future Orders Please Complete By Expires   Diet - low sodium heart healthy      Increase activity slowly      Discharge instructions      Comments:   **PLEASE REMEMBER TO BRING ALL OF YOUR MEDICATIONS TO EACH OF YOUR FOLLOW-UP OFFICE VISITS.  * If your diarrhea continues or worsens please see your PCP  * Please wear your CPAP as previously instructed.     Follow-up Information    Follow up with Tereso Newcomer, PA on 07/13/2011. (Appointment at 8:50 followed by lab work (BMET & CBC))    Contact information:   [facility] [address] [place] [address] [phone]       Follow up with Olga Millers, MD on 08/03/2011. (9:00)    Contact information:   [facility] [address] [place] [address] [phone]           Outstanding Labs/Studies:  1. BMET & CBC as scheduled above  Duration of Discharge Encounter: Greater than 30 minutes including physician and PA time.  Signed, Dhamar Gregory PA-C 07/06/2011, 10:11 AM

## 2011-07-06 NOTE — Progress Notes (Signed)
DC orders received.  Patient stable with no S/S of distress.  Medication and discharge information reviewed with patient and family member. Patient DC home with wife. Nolon Nations

## 2011-07-12 ENCOUNTER — Encounter: Payer: Self-pay | Admitting: Physician Assistant

## 2011-07-13 ENCOUNTER — Ambulatory Visit (INDEPENDENT_AMBULATORY_CARE_PROVIDER_SITE_OTHER): Payer: Medicare Other | Admitting: Physician Assistant

## 2011-07-13 ENCOUNTER — Encounter: Payer: Self-pay | Admitting: Physician Assistant

## 2011-07-13 ENCOUNTER — Other Ambulatory Visit: Payer: Medicare Other

## 2011-07-13 VITALS — BP 144/72 | HR 60 | Ht 72.0 in | Wt 254.0 lb

## 2011-07-13 DIAGNOSIS — I4891 Unspecified atrial fibrillation: Secondary | ICD-10-CM

## 2011-07-13 DIAGNOSIS — I10 Essential (primary) hypertension: Secondary | ICD-10-CM

## 2011-07-13 DIAGNOSIS — R197 Diarrhea, unspecified: Secondary | ICD-10-CM

## 2011-07-13 DIAGNOSIS — R609 Edema, unspecified: Secondary | ICD-10-CM

## 2011-07-13 LAB — CBC WITH DIFFERENTIAL/PLATELET
Basophils Absolute: 0 10*3/uL (ref 0.0–0.1)
Eosinophils Absolute: 0.2 10*3/uL (ref 0.0–0.7)
HCT: 34 % — ABNORMAL LOW (ref 39.0–52.0)
Hemoglobin: 11.2 g/dL — ABNORMAL LOW (ref 13.0–17.0)
Lymphocytes Relative: 24.9 % (ref 12.0–46.0)
Lymphs Abs: 1.7 10*3/uL (ref 0.7–4.0)
MCHC: 32.9 g/dL (ref 30.0–36.0)
Neutro Abs: 4.2 10*3/uL (ref 1.4–7.7)
RDW: 13.4 % (ref 11.5–14.6)

## 2011-07-13 LAB — BASIC METABOLIC PANEL
BUN: 17 mg/dL (ref 6–23)
CO2: 27 mEq/L (ref 19–32)
Calcium: 8.9 mg/dL (ref 8.4–10.5)
Glucose, Bld: 94 mg/dL (ref 70–99)
Sodium: 143 mEq/L (ref 135–145)

## 2011-07-13 MED ORDER — HYDROCHLOROTHIAZIDE 12.5 MG PO CAPS: 12.5000 mg | ORAL_CAPSULE | ORAL | Status: DC | PRN

## 2011-07-13 NOTE — Patient Instructions (Signed)
Your physician recommends that you schedule a follow-up appointment in: WITH DR. CRENSHAW AS SCHEDULED  Your physician recommends that you return for lab work in: TODAY BMET, CBC W/DIFF   TAKE HCTZ 12.5 MG ONLY AS NEEDED FOR SWELLING

## 2011-07-13 NOTE — Progress Notes (Signed)
[address] [place], [place]  [address] Phone: [phone] Fax:  [phone]  Date:  07/13/2011   Name:  Henry Martin   DOB:  [birth year]   MRN:  [id number]  PCP:  Leo Grosser, MD, MD  Primary Cardiologist:  Dr. Olga Millers  Primary Electrophysiologist:  None    History of Present Illness: Henry Martin is a 71 y.o. male who returns for post hospital follow up.  He has a history of nonobstructive CAD by cardiac catheterization in 2005 (ASTEROID trial), HTN, hyperlipidemia, prior stroke, renal cell CA, s/p right partial nephrectomy in 2000, DJD, history of partial thyroidectomy, OSA.  He was admitted 4/30-5/2 with AFib with RVR in the setting of viral gastroenteritis.  Cardiac markers remained negative.  He was placed on Xarelto.  Aggrenox was discontinued.  He continued to have problems with diarrhea.  He developed ARF secondary to dehydration (peak creatinine 2.00).  He improved with hydration.  D/c creatinine was normal.  C. Difficile PCR was negative.  Of note, his Benicar/HCTZ was discontinued and he was placed on Toprol.  He did convert to normal sinus rhythm while hospitalized.  Echocardiogram 07/05/11: Mild LVH, EF 55-60%, grade 2 diastolic dysfunction, mildly dilated aortic root (40 mm), trivial MR, LA upper limits of normal, PASP 26.  Doing well.  BP was going up.  Went to PCP and BP was 180 systolic.  Put back on Benicar 20 mg QD.  Feels somewhat fatigued.  The patient denies chest pain, shortness of breath, syncope, orthopnea, PND.  Has noted some LE edema.  No bleeding problems.    Wt Readings from Last 3 Encounters:  07/13/11 254 lb (115.214 kg)  07/04/11 249 lb 4.8 oz (113.082 kg)     Potassium  Date/Time Value Range Status  07/06/2011  5:14 AM 3.3* 3.5-5.1 (mEq/L) Final     Creatinine, Ser  Date/Time Value Range Status  07/06/2011  5:14 AM 0.99  0.50-1.35 (mg/dL) Final     ALT  Date/Time Value Range Status  07/04/2011  1:06 PM 29   0-53 (U/L) Final     TSH  Date/Time Value Range Status  07/04/2011  3:57 PM 3.299  0.350-4.500 (uIU/mL) Final     Hemoglobin  Date/Time Value Range Status  07/05/2011  6:05 AM 12.3* 13.0-17.0 (g/dL) Final    Past Medical History  Diagnosis Date  . HTN (hypertension)   . HLD (hyperlipidemia)   . Renal cell cancer, right 2000    s/p partial neprhectomy   . CAD (coronary artery disease)     mild nonobstructive by cath '05 (ASTEROID trial - mLAD 30%, CFX 20-30%, OM 20%, mRCA 30%, normal LVF), EF 65% by echo '12;    . Atrial fibrillation with rapid ventricular response 07/04/11    Xarelto started 4/13;    . Complication of anesthesia     "bowels fall asleep & he can't have BM"  . OSA (obstructive sleep apnea)     "suppose to be wearing my mask"  . GERD (gastroesophageal reflux disease) 07/04/11    "once in awhile; haven't been treated for it"  . Stroke 2007    denies residual  . Renal cell carcinoma   . Skin cancer 06/2011    left ear    Current Outpatient Prescriptions  Medication Sig Dispense Refill  . amoxicillin (AMOXIL) 500 MG capsule 2,000 mg daily. ( ONLY BEFORE DENTAL WORK )      . atorvastatin (LIPITOR) 20 MG tablet Take  20 mg by mouth daily.      . Cholecalciferol (VITAMIN D3) 1000 UNITS CAPS Take 1 capsule by mouth daily.      . Choline Fenofibrate (TRILIPIX) 135 MG capsule Take 135 mg by mouth daily.      Marland Kitchen doxazosin (CARDURA) 2 MG tablet Take 2 mg by mouth daily.      . fluticasone (FLONASE) 50 MCG/ACT nasal spray Place 2 sprays into the nose every evening.       . metoprolol succinate (TOPROL-XL) 25 MG 24 hr tablet Take 1 tablet (25 mg total) by mouth daily.  30 tablet  6  . omega-3 acid ethyl esters (LOVAZA) 1 G capsule Take 1 g by mouth daily.      . rivaroxaban 20 MG TABS Take 20 mg by mouth daily with supper.  30 tablet  6    Allergies: Allergies  Allergen Reactions  . Contrast Media (Iodinated Diagnostic Agents) Other (See Comments)    "Isovue"; "blisters;  all down my legs"    History  Substance Use Topics  . Smoking status: Former Smoker -- 1.0 packs/day for 54 years    Types: Cigarettes    Quit date: 04/04/1977  . Smokeless tobacco: Former Neurosurgeon    Types: Chew    Quit date: 10/04/2005  . Alcohol Use: Yes     07/04/11 "might have had glass of wine 04/2011"     ROS:  Please see the history of present illness.   All other systems reviewed and negative.   PHYSICAL EXAM: VS:  BP 144/72  Pulse 60  Ht 6' (1.829 m)  Wt 254 lb (115.214 kg)  BMI 34.45 kg/m2 Well nourished, well developed, in no acute distress HEENT: normal Neck: no JVD Endo: no thyromegaly  Cardiac:  normal S1, S2; RRR; no murmur Lungs:  clear to auscultation bilaterally, no wheezing, rhonchi or rales Abd: soft, nontender, no hepatomegaly Ext: trace to 1+ bilateral LE edema Skin: warm and dry Neuro:  CNs 2-12 intact, no focal abnormalities noted  EKG:  NSR, HR 60, LAD, RBBB, TW inversions in 3, aVF   ASSESSMENT AND PLAN:  1.  Atrial Fibrillation   -  Maintaining NSR.   -  CHADS2=3.  Continue Xarelto.  Check CBC today.   -  Follow up with Dr. Olga Millers as scheduled.  2.  Hypertension   -  BP better since restarting Benicar.     -  Check BMET today.  3.  Edema   -  Patient previously on Benicar/HCT.  Leaving for [place], [place] this weekend for 2 weeks.  No way to get f/u labs while there.   -  Will give him HCTZ 12.5 mg to take daily PRN (only) for edema.  4.  Coronary Artery Disease   -  No angina.   -  No ASA since now on Xarelto.   Signed, Tereso Newcomer, PA-C  9:20 AM 07/13/2011

## 2011-07-14 ENCOUNTER — Telehealth: Payer: Self-pay | Admitting: *Deleted

## 2011-07-14 DIAGNOSIS — D649 Anemia, unspecified: Secondary | ICD-10-CM

## 2011-07-14 NOTE — Telephone Encounter (Signed)
Message copied by Tarri Fuller on Fri Jul 14, 2011  4:10 PM ------      Message from: [place], [place] T      Created: Thu Jul 13, 2011  1:37 PM       K+ and creatinine ok      Hgb is a little lower.      Need to keep close eye on Hgb now that he is on Xarelto.      Really should get a repeat CBC in one week.       See if he can take order with him and get CBC drawn next week while he is in [place] and have faxed to [place].      Tereso Newcomer, PA-C  1:37 PM 07/13/2011

## 2011-07-14 NOTE — Telephone Encounter (Signed)
pt notified of lab results today. states he will call us when he gets to [place] to let us know if he will have time to get CBC because [place], see documentation note for the rest of message; I advised pt to watch for any signs of blood in his urine or stool and to let us know if he does. Advised pt that if he gets blood done to call so I can send an order.

## 2011-07-14 NOTE — Telephone Encounter (Signed)
pt is going to come pick up Rx to take with him to have CBC done while he is away

## 2011-07-20 ENCOUNTER — Telehealth: Payer: Self-pay | Admitting: Physician Assistant

## 2011-07-20 NOTE — Telephone Encounter (Signed)
New Problem:    Patient called in wanting to know how he needs to proceed now that he has had his blood work done in [place].  Please call back.

## 2011-07-21 ENCOUNTER — Encounter: Payer: Self-pay | Admitting: Physician Assistant

## 2011-07-21 NOTE — Telephone Encounter (Signed)
F/U   Patient returning call to Danielle Rankin, he can be reached at [phone].

## 2011-07-21 NOTE — Telephone Encounter (Signed)
rcv'd labs from [facility] today for pt and Bing Neighbors. PAC reviewed, gave pt results staing H/H stable and to stay on current RX regimen, I then cb pt and gave him the results from that labs and he gave me verbal understanding today

## 2011-07-21 NOTE — Telephone Encounter (Signed)
lmom ptcb to find out where in [place] did he have labs done at, the labs have not yet been faxed over to us, so I will have to call where he had them done to get them faxed over to us 

## 2011-07-21 NOTE — Telephone Encounter (Signed)
lmom ptcb to find out where in [place] did he have labs done at, the labs have not yet been faxed over to [place], so I will have to call where he had them done to get them faxed over to [place]

## 2011-07-21 NOTE — Telephone Encounter (Signed)
rcv'd labs from [facility] today for pt and Henry W. PAC reviewed, gave pt results staing H/H stable and to stay on current RX regimen, I then cb pt and gave him the results from that labs and he gave me verbal understanding today 

## 2011-08-03 ENCOUNTER — Encounter: Payer: Medicare Other | Admitting: Cardiology

## 2011-09-26 ENCOUNTER — Ambulatory Visit (INDEPENDENT_AMBULATORY_CARE_PROVIDER_SITE_OTHER): Payer: Medicare Other | Admitting: Cardiology

## 2011-09-26 ENCOUNTER — Encounter: Payer: Self-pay | Admitting: Cardiology

## 2011-09-26 VITALS — BP 141/70 | HR 65 | Ht 72.0 in | Wt 249.0 lb

## 2011-09-26 DIAGNOSIS — I10 Essential (primary) hypertension: Secondary | ICD-10-CM

## 2011-09-26 DIAGNOSIS — I4891 Unspecified atrial fibrillation: Secondary | ICD-10-CM

## 2011-09-26 DIAGNOSIS — I2510 Atherosclerotic heart disease of native coronary artery without angina pectoris: Secondary | ICD-10-CM

## 2011-09-26 DIAGNOSIS — E785 Hyperlipidemia, unspecified: Secondary | ICD-10-CM

## 2011-09-26 DIAGNOSIS — IMO0002 Reserved for concepts with insufficient information to code with codable children: Secondary | ICD-10-CM

## 2011-09-26 LAB — CBC WITH DIFFERENTIAL/PLATELET
Basophils Relative: 0.5 % (ref 0.0–3.0)
Eosinophils Relative: 3.8 % (ref 0.0–5.0)
Lymphocytes Relative: 26.5 % (ref 12.0–46.0)
Neutrophils Relative %: 58.1 % (ref 43.0–77.0)
RBC: 4.46 Mil/uL (ref 4.22–5.81)
WBC: 6.5 10*3/uL (ref 4.5–10.5)

## 2011-09-26 LAB — BASIC METABOLIC PANEL
Calcium: 9.3 mg/dL (ref 8.4–10.5)
Creatinine, Ser: 1.2 mg/dL (ref 0.4–1.5)

## 2011-09-26 NOTE — Assessment & Plan Note (Signed)
Continue statin. Not on aspirin given need for anticoagulation. 

## 2011-09-26 NOTE — Progress Notes (Signed)
HPI: Pleasant male for FU of atrial fibrillation. He has a history of nonobstructive CAD by cardiac catheterization in 2005 (ASTEROID trial). He was admitted 4/13 with AFib with RVR in the setting of viral gastroenteritis. Cardiac markers remained negative. He was placed on Xarelto. Aggrenox was discontinued. Echocardiogram 07/05/11: Mild LVH, EF 55-60%, grade 2 diastolic dysfunction, mildly dilated aortic root (40 mm), trivial MR, LA upper limits of normal, PASP 26. Patient last seen in May of 2013. Since then, the patient denies any dyspnea on exertion, orthopnea, PND, palpitations, syncope or chest pain. Occasional mild pedal edema.    Current Outpatient Prescriptions  Medication Sig Dispense Refill  . amoxicillin (AMOXIL) 500 MG capsule 2,000 mg daily. ( ONLY BEFORE DENTAL WORK )      . atorvastatin (LIPITOR) 20 MG tablet Take 20 mg by mouth daily.      Marland Kitchen BENICAR 20 MG tablet 1/2 tab po qd.      . Cholecalciferol (VITAMIN D3) 1000 UNITS CAPS Take 1 capsule by mouth daily.      . Choline Fenofibrate (TRILIPIX) 135 MG capsule Take 135 mg by mouth daily.      Marland Kitchen doxazosin (CARDURA) 2 MG tablet Take 2 mg by mouth daily.      . fluticasone (FLONASE) 50 MCG/ACT nasal spray Place 2 sprays into the nose every evening.       . hydrochlorothiazide (MICROZIDE) 12.5 MG capsule Take 12.5 mg by mouth as needed. Only when pt does not have benicar      . metoprolol succinate (TOPROL-XL) 25 MG 24 hr tablet Take 1 tablet (25 mg total) by mouth daily.  30 tablet  6  . omega-3 acid ethyl esters (LOVAZA) 1 G capsule Take 1 g by mouth daily.      . rivaroxaban 20 MG TABS Take 20 mg by mouth daily with supper.  30 tablet  6  . DISCONTD: hydrochlorothiazide (MICROZIDE) 12.5 MG capsule Take 1 capsule (12.5 mg total) by mouth as needed.  30 capsule  3     Past Medical History  Diagnosis Date  . HTN (hypertension)   . HLD (hyperlipidemia)   . Renal cell cancer, right 2000    s/p partial neprhectomy   . CAD  (coronary artery disease)     mild nonobstructive by cath '05 (ASTEROID trial - mLAD 30%, CFX 20-30%, OM 20%, mRCA 30%, normal LVF), EF 65% by echo '12;    . Atrial fibrillation with rapid ventricular response 07/04/11    Xarelto started 4/13;    . Complication of anesthesia     "bowels fall asleep & he can't have BM"  . OSA (obstructive sleep apnea)     "suppose to be wearing my mask"  . GERD (gastroesophageal reflux disease) 07/04/11    "once in awhile; haven't been treated for it"  . Stroke 2007    denies residual  . Renal cell carcinoma   . Skin cancer 06/2011    left ear    Past Surgical History  Procedure Date  . Thyroidectomy, partial ~ 2007  . Partial nephrectomy 2000    right; "took off  25%"  . Total knee arthroplasty 2010    left  . Tonsillectomy and adenoidectomy     "as a child"  . Knee arthroscopy 1990's    left  . Cardiac catheterization ~ 2007    History   Social History  . Marital Status: Married    Spouse Name: N/A    Number of  Children: N/A  . Years of Education: N/A   Occupational History  . Not on file.   Social History Main Topics  . Smoking status: Former Smoker -- 1.0 packs/day for 54 years    Types: Cigarettes    Quit date: 04/04/1977  . Smokeless tobacco: Former Neurosurgeon    Types: Chew    Quit date: 10/04/2005  . Alcohol Use: Yes     07/04/11 "might have had glass of wine 04/2011"  . Drug Use: No  . Sexually Active: Yes   Other Topics Concern  . Not on file   Social History Narrative   Retired. Lives in [place]    ROS: no fevers or chills, productive cough, hemoptysis, dysphasia, odynophagia, melena, hematochezia, dysuria, hematuria, rash, seizure activity, orthopnea, PND, pedal edema, claudication. Remaining systems are negative.  Physical Exam: Well-developed well-nourished in no acute distress.  Skin is warm and dry.  HEENT is normal.  Neck is supple.  Chest is clear to auscultation with normal expansion.  Cardiovascular  exam is regular rate and rhythm.  Abdominal exam nontender or distended. No masses palpated. Extremities show trace edema. neuro grossly intact  ECG normal sinus rhythm at a rate of 65. Left ventricular hypertrophy. 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

## 2011-09-26 NOTE — Patient Instructions (Addendum)
Your physician wants you to follow-up in: ONE YEAR WITH DR CRENSHAW You will receive a reminder letter in the mail two months in advance. If you don't receive a letter, please call our office to schedule the follow-up appointment.   Your physician recommends that you HAVE LAB WORK TODAY 

## 2011-09-26 NOTE — Assessment & Plan Note (Signed)
Blood pressure controlled. Continue present medications. 

## 2011-09-26 NOTE — Assessment & Plan Note (Signed)
Plan continue xeralto. Continue Toprol. Check hemoglobin and renal function.

## 2011-09-26 NOTE — Assessment & Plan Note (Signed)
Continue statin. Lipids and liver monitored by primary care. 

## 2011-10-02 ENCOUNTER — Telehealth: Payer: Self-pay | Admitting: Cardiology

## 2011-10-02 NOTE — Telephone Encounter (Signed)
Fu call °Pt returning your call  °

## 2011-10-02 NOTE — Telephone Encounter (Signed)
Spoke with pt, aware of lab results. 

## 2011-11-14 ENCOUNTER — Ambulatory Visit (HOSPITAL_COMMUNITY)
Admission: RE | Admit: 2011-11-14 | Discharge: 2011-11-14 | Disposition: A | Discharge status: Home / Self Care | Payer: Medicare Other | Source: Ambulatory Visit | Attending: Urology | Admitting: Urology

## 2011-11-14 ENCOUNTER — Other Ambulatory Visit (HOSPITAL_COMMUNITY): Payer: Self-pay | Admitting: Urology

## 2011-11-14 DIAGNOSIS — Z85528 Personal history of other malignant neoplasm of kidney: Secondary | ICD-10-CM

## 2011-11-14 DIAGNOSIS — Z905 Acquired absence of kidney: Secondary | ICD-10-CM | POA: Insufficient documentation

## 2012-07-30 ENCOUNTER — Telehealth: Payer: Self-pay | Admitting: Family Medicine

## 2012-07-31 MED ORDER — ATORVASTATIN CALCIUM 20 MG PO TABS: 20.0000 mg | ORAL_TABLET | Freq: Every day | ORAL | Status: DC

## 2012-07-31 NOTE — Telephone Encounter (Signed)
Rx Refilled  

## 2012-08-12 ENCOUNTER — Ambulatory Visit (INDEPENDENT_AMBULATORY_CARE_PROVIDER_SITE_OTHER): Payer: Medicare Other | Admitting: Family Medicine

## 2012-08-12 ENCOUNTER — Encounter: Payer: Self-pay | Admitting: Family Medicine

## 2012-08-12 VITALS — BP 120/60 | HR 88 | Temp 97.8°F | Resp 20 | Ht 70.5 in | Wt 256.0 lb

## 2012-08-12 DIAGNOSIS — E785 Hyperlipidemia, unspecified: Secondary | ICD-10-CM

## 2012-08-12 DIAGNOSIS — Z0000 Encounter for general adult medical examination without abnormal findings: Secondary | ICD-10-CM

## 2012-08-12 DIAGNOSIS — W57XXXA Bitten or stung by nonvenomous insect and other nonvenomous arthropods, initial encounter: Secondary | ICD-10-CM | POA: Insufficient documentation

## 2012-08-12 DIAGNOSIS — S30860A Insect bite (nonvenomous) of lower back and pelvis, initial encounter: Secondary | ICD-10-CM

## 2012-08-12 NOTE — Progress Notes (Signed)
  Subjective:    Patient ID: Henry Martin, male    DOB: [birth year], 72 y.o.   MRN: [id number]  HPI Patient presents with take by 2 left upper back one week ago. He states the tick was on for a few hours and he pulled it off with tweezers. A small red spot came up which is now going down. He denies any fever headache joint pains or malaise. He does state for couple days he was little queasy but denies any vomiting or diarrhea he called the nurse hotline was told to come in to have this bite evaluated. He is overdue for physical exam and fasting labs   Review of Systems  GEN- denies fatigue, fever, weight loss,weakness, recent illness HEENT- denies eye drainage, change in vision, nasal discharge, CVS- denies chest pain, palpitations RESP- denies SOB, cough, wheeze ABD- denies N/V, change in stools, abd pain MSK- denies joint pain, muscle aches, injury Neuro- denies headache, dizziness, syncope, seizure activity      Objective:   Physical Exam  GEN- NAD, alert and oriented x3 CVS- RRR, no murmur RESP-CTAB ABD-NABS,soft,NT,ND Skin- Left upper back, small erythematous maculopapular lesion, NT, no bull's eye sign,no fluctuance. No streaking, no tick seen on exam EXT- No edema Pulses- Radial 2+       Assessment & Plan:

## 2012-08-12 NOTE — Patient Instructions (Addendum)
Call if you have any problems or changes No medications needed F/U Dr. Tanya Nones for Physical  In 6 weeks  Lyme Disease You may have been bitten by a tick and are to watch for the development of Lyme Disease. Lyme Disease is an infection that is caused by a bacteria The bacteria causing this disease is named Borreilia burgdorferi. If a tick is infected with this bacteria and then bites you, then Lyme Disease may occur. These ticks are carried by deer and rodents such as rabbits and mice and infest grassy as well as forested areas. Fortunately most tick bites do not cause Lyme Disease.  Lyme Disease is easier to prevent than to treat. First, covering your legs with clothing when walking in areas where ticks are possibly abundant will prevent their attachment because ticks tend to stay within inches of the ground. Second, using insecticides containing DEET can be applied on skin or clothing. Last, because it takes about 12 to 24 hours for the tick to transmit the disease after attachment to the human host, you should inspect your body for ticks twice a day when you are in areas where Lyme Disease is common. You must look thoroughly when searching for ticks. The Ixodes tick that carries Lyme Disease is very small. It is around the size of a sesame seed (picture of tick is not actual size). Removal is best done by grasping the tick by the head and pulling it out. Do not to squeeze the body of the tick. This could inject the infecting bacteria into the bite site. Wash the area of the bite with an antiseptic solution after removal.  Lyme Disease is a disease that may affect many body systems. Because of the small size of the biting tick, most people do not notice being bitten. The first sign of an infection is usually a round red rash that extends out from the center of the tick bite. The center of the lesion may be blood colored (hemorrhagic) or have tiny blisters (vesicular). Most lesions have bright red outer  borders and partial central clearing. This rash may extend out many inches in diameter, and multiple lesions may be present. Other symptoms such as fatigue, headaches, chills and fever, general achiness and swelling of lymph glands may also occur. If this first stage of the disease is left untreated, these symptoms may gradually resolve by themselves, or progressive symptoms may occur because of spread of infection to other areas of the body.  Follow up with your caregiver to have testing and treatment if you have a tick bite and you develop any of the above complaints. Your caregiver may recommend preventative (prophylactic) medications which kill bacteria (antibiotics). Once a diagnosis of Lyme Disease is made, antibiotic treatment is highly likely to cure the disease. Effective treatment of late stage Lyme Disease may require longer courses of antibiotic therapy.  MAKE SURE YOU:   Understand these instructions.  Will watch your condition.  Will get help right away if you are not doing well or get worse. Document Released: 05/29/2000 Document Revised: 05/15/2011 Document Reviewed: 07/31/2008 [facility] Patient Information 2014 [place], [place].

## 2012-08-12 NOTE — Assessment & Plan Note (Signed)
No signs of Lyme disease. I doubt his mild nausea was related. We discussed symptoms of Lyme disease and he will give me a call back if he could levels any headache chills fatigue fever or worsening rash. I will hold on antibiotic use at this time. Patient agrees

## 2012-08-20 ENCOUNTER — Other Ambulatory Visit (INDEPENDENT_AMBULATORY_CARE_PROVIDER_SITE_OTHER): Payer: Self-pay

## 2012-08-20 DIAGNOSIS — Z0000 Encounter for general adult medical examination without abnormal findings: Secondary | ICD-10-CM

## 2012-08-20 DIAGNOSIS — E785 Hyperlipidemia, unspecified: Secondary | ICD-10-CM

## 2012-08-20 LAB — CBC WITH DIFFERENTIAL/PLATELET
Basophils Absolute: 0.1 10*3/uL (ref 0.0–0.1)
Basophils Relative: 1 % (ref 0–1)
Eosinophils Absolute: 0.2 10*3/uL (ref 0.0–0.7)
Hemoglobin: 13.6 g/dL (ref 13.0–17.0)
MCH: 29.2 pg (ref 26.0–34.0)
MCHC: 33.3 g/dL (ref 30.0–36.0)
Monocytes Relative: 9 % (ref 3–12)
Neutro Abs: 3.9 10*3/uL (ref 1.7–7.7)
Neutrophils Relative %: 56 % (ref 43–77)
Platelets: 176 10*3/uL (ref 150–400)
RDW: 13.9 % (ref 11.5–15.5)

## 2012-08-20 LAB — COMPREHENSIVE METABOLIC PANEL
Alkaline Phosphatase: 45 U/L (ref 39–117)
Creat: 1.12 mg/dL (ref 0.50–1.35)
Glucose, Bld: 115 mg/dL — ABNORMAL HIGH (ref 70–99)
Sodium: 145 mEq/L (ref 135–145)
Total Bilirubin: 0.5 mg/dL (ref 0.3–1.2)
Total Protein: 6.3 g/dL (ref 6.0–8.3)

## 2012-08-20 LAB — LIPID PANEL
LDL Cholesterol: 64 mg/dL (ref 0–99)
Total CHOL/HDL Ratio: 2.7 Ratio
Triglycerides: 58 mg/dL (ref <150)
VLDL: 12 mg/dL (ref 0–40)

## 2012-08-30 ENCOUNTER — Encounter: Payer: Self-pay | Admitting: Family Medicine

## 2012-08-30 ENCOUNTER — Ambulatory Visit (INDEPENDENT_AMBULATORY_CARE_PROVIDER_SITE_OTHER): Payer: Medicare Other | Admitting: Family Medicine

## 2012-08-30 VITALS — BP 110/60 | HR 68 | Temp 98.1°F | Resp 18 | Ht 72.0 in | Wt 254.0 lb

## 2012-08-30 DIAGNOSIS — Z0000 Encounter for general adult medical examination without abnormal findings: Secondary | ICD-10-CM

## 2012-08-30 DIAGNOSIS — Z1211 Encounter for screening for malignant neoplasm of colon: Secondary | ICD-10-CM

## 2012-08-30 MED ORDER — FLUTICASONE PROPIONATE 50 MCG/ACT NA SUSP: 2.0000 | Freq: Every evening | NASAL | Status: DC

## 2012-08-30 MED ORDER — ATORVASTATIN CALCIUM 20 MG PO TABS: 20.0000 mg | ORAL_TABLET | Freq: Every day | ORAL | Status: DC

## 2012-08-30 MED ORDER — RIVAROXABAN 20 MG PO TABS: 20.0000 mg | ORAL_TABLET | Freq: Every day | ORAL | Status: DC

## 2012-08-30 MED ORDER — HYDROCHLOROTHIAZIDE 12.5 MG PO CAPS: 12.5000 mg | ORAL_CAPSULE | ORAL | Status: DC | PRN

## 2012-08-30 MED ORDER — METOPROLOL SUCCINATE ER 25 MG PO TB24: 25.0000 mg | ORAL_TABLET | Freq: Every day | ORAL | Status: DC

## 2012-08-30 MED ORDER — DOXAZOSIN MESYLATE 2 MG PO TABS: 2.0000 mg | ORAL_TABLET | Freq: Every day | ORAL | Status: DC

## 2012-08-30 NOTE — Progress Notes (Signed)
Subjective:    Patient ID: Henry Martin, male    DOB: [birth year], 72 y.o.   MRN: [id number]  HPI  Patient is here today for a complete physical exam.   He has no major complaints. His last colonoscopy was in 2004 and he is due for this now.  His prostate exam is checked every August by Dr. Vernie Ammons and I will defer this to him.  Patient had a Pneumovax in 2005. His last shot was 2010. He had Zostavax in 2009.  Past Medical History  Diagnosis Date  . HTN (hypertension)   . HLD (hyperlipidemia)   . Renal cell cancer, right 2000    s/p partial neprhectomy   . CAD (coronary artery disease)     mild nonobstructive by cath '05 (ASTEROID trial - mLAD 30%, CFX 20-30%, OM 20%, mRCA 30%, normal LVF), EF 65% by echo '12;    . Atrial fibrillation with rapid ventricular response 07/04/11    Xarelto started 4/13;    . Complication of anesthesia     "bowels fall asleep & he can't have BM"  . OSA (obstructive sleep apnea)     "suppose to be wearing my mask"  . GERD (gastroesophageal reflux disease) 07/04/11    "once in awhile; haven't been treated for it"  . Stroke 2007    denies residual  . Renal cell carcinoma   . Skin cancer 06/2011    left ear   Past Surgical History  Procedure Laterality Date  . Thyroidectomy, partial  ~ 2007  . Partial nephrectomy  2000    right; "took off  25%"  . Total knee arthroplasty  2010    left  . Tonsillectomy and adenoidectomy      "as a child"  . Knee arthroscopy  1990's    left  . Cardiac catheterization  ~ 2007   Current Outpatient Prescriptions on File Prior to Visit  Medication Sig Dispense Refill  . Cholecalciferol (VITAMIN D3) 1000 UNITS CAPS Take 2 capsules by mouth daily.        No current facility-administered medications on file prior to visit.   Allergies  Allergen Reactions  . Contrast Media (Iodinated Diagnostic Agents) Other (See Comments)    "Isovue"; "blisters; all down my legs"   History   Social History  . Marital Status:  Married    Spouse Name: N/A    Number of Children: N/A  . Years of Education: N/A   Occupational History  . Not on file.   Social History Main Topics  . Smoking status: Former Smoker -- 1.00 packs/day for 54 years    Types: Cigarettes    Quit date: 04/04/1977  . Smokeless tobacco: Former Neurosurgeon    Types: Chew    Quit date: 10/04/2005  . Alcohol Use: Yes     Comment: 07/04/11 "might have had glass of wine 04/2011"  . Drug Use: No  . Sexually Active: Yes     Comment: married happily to [place]   Other Topics Concern  . Not on file   Social History Narrative   Retired. Lives in [place]   Family History  Problem Relation Age of Onset  . Heart failure Mother     died at 30  . Diabetes Mother   . Cancer Neg Hx     no colon or prostate cancer      Review of Systems  All other systems reviewed and are negative.       Objective:  Physical Exam  Vitals reviewed. Constitutional: He is oriented to person, place, and time. He appears well-developed and well-nourished. No distress.  HENT:  Head: Normocephalic and atraumatic.  Right Ear: External ear normal.  Left Ear: External ear normal.  Nose: Nose normal.  Mouth/Throat: Oropharynx is clear and moist. No oropharyngeal exudate.  Eyes: Conjunctivae and EOM are normal. Pupils are equal, round, and reactive to light. Right eye exhibits no discharge. Left eye exhibits no discharge. No scleral icterus.  Neck: Normal range of motion. Neck supple. No JVD present. No tracheal deviation present. No thyromegaly present.  Cardiovascular: Normal rate, regular rhythm, normal heart sounds and intact distal pulses.  Exam reveals no gallop and no friction rub.   No murmur heard. Pulmonary/Chest: Effort normal and breath sounds normal. No stridor. No respiratory distress. He has no wheezes. He has no rales. He exhibits no tenderness.  Abdominal: Soft. Bowel sounds are normal. He exhibits no distension and no mass. There is no tenderness.  There is no rebound and no guarding.  Musculoskeletal: Normal range of motion. He exhibits no edema and no tenderness.  Lymphadenopathy:    He has no cervical adenopathy.  Neurological: He is alert and oriented to person, place, and time. He displays normal reflexes. No cranial nerve deficit. He exhibits normal muscle tone. Coordination normal.  Skin: Skin is warm and dry. No rash noted. He is not diaphoretic. No erythema. No pallor.  Psychiatric: He has a normal mood and affect. His behavior is normal. Judgment and thought content normal.   patient has chronic venous stasis changes and small varicosities at both ankles.        Assessment & Plan:  1. Routine general medical examination at a health care facility Patient's physical exam is normal except for an elevated BMI. His most recent labwork as listed below.  Appointment on 08/20/2012  Component Date Value Range Status  . Sodium 08/20/2012 145  135 - 145 mEq/L Final  . Potassium 08/20/2012 4.7  3.5 - 5.3 mEq/L Final  . Chloride 08/20/2012 106  96 - 112 mEq/L Final  . CO2 08/20/2012 30  19 - 32 mEq/L Final  . Glucose, Bld 08/20/2012 115* 70 - 99 mg/dL Final  . BUN 16/12/9602 18  6 - 23 mg/dL Final  . Creat 54/11/8117 1.12  0.50 - 1.35 mg/dL Final  . Total Bilirubin 08/20/2012 0.5  0.3 - 1.2 mg/dL Final  . Alkaline Phosphatase 08/20/2012 45  39 - 117 U/L Final  . AST 08/20/2012 24  0 - 37 U/L Final  . ALT 08/20/2012 32  0 - 53 U/L Final  . Total Protein 08/20/2012 6.3  6.0 - 8.3 g/dL Final  . Albumin 14/78/2956 3.8  3.5 - 5.2 g/dL Final  . Calcium 21/30/8657 9.3  8.4 - 10.5 mg/dL Final  . Cholesterol 84/69/6295 121  0 - 200 mg/dL Final   Comment: ATP III Classification:                                < 200        mg/dL        Desirable                               200 - 239     mg/dL        Borderline High                               >=  240        mg/dL        High                             . Triglycerides 08/20/2012 58   <150 mg/dL Final  . HDL 16/12/9602 45  >39 mg/dL Final  . Total CHOL/HDL Ratio 08/20/2012 2.7   Final  . VLDL 08/20/2012 12  0 - 40 mg/dL Final  . LDL Cholesterol 08/20/2012 64  0 - 99 mg/dL Final   Comment:                            Total Cholesterol/HDL Ratio:CHD Risk                                                 Coronary Heart Disease Risk Table                                                                 Men       Women                                   1/2 Average Risk              3.4        3.3                                       Average Risk              5.0        4.4                                    2X Average Risk              9.6        7.1                                    3X Average Risk             23.4       11.0                          Use the calculated Patient Ratio above and the CHD Risk table                           to determine the patient's CHD Risk.                          ATP III Classification (LDL):                                <  100        mg/dL         Optimal                               100 - 129     mg/dL         Near or Above Optimal                               130 - 159     mg/dL         Borderline High                               160 - 189     mg/dL         High                                > 190        mg/dL         Very High                             . WBC 08/20/2012 7.0  4.0 - 10.5 K/uL Final  . RBC 08/20/2012 4.65  4.22 - 5.81 MIL/uL Final  . Hemoglobin 08/20/2012 13.6  13.0 - 17.0 g/dL Final  . HCT 16/12/9602 40.9  39.0 - 52.0 % Final  . MCV 08/20/2012 88.0  78.0 - 100.0 fL Final  . MCH 08/20/2012 29.2  26.0 - 34.0 pg Final  . MCHC 08/20/2012 33.3  30.0 - 36.0 g/dL Final  . RDW 54/11/8117 13.9  11.5 - 15.5 % Final  . Platelets 08/20/2012 176  150 - 400 K/uL Final  . Neutrophils Relative % 08/20/2012 56  43 - 77 % Final  . Neutro Abs 08/20/2012 3.9  1.7 - 7.7 K/uL Final  . Lymphocytes Relative 08/20/2012 31  12 - 46 %  Final  . Lymphs Abs 08/20/2012 2.2  0.7 - 4.0 K/uL Final  . Monocytes Relative 08/20/2012 9  3 - 12 % Final  . Monocytes Absolute 08/20/2012 0.7  0.1 - 1.0 K/uL Final  . Eosinophils Relative 08/20/2012 3  0 - 5 % Final  . Eosinophils Absolute 08/20/2012 0.2  0.0 - 0.7 K/uL Final  . Basophils Relative 08/20/2012 1  0 - 1 % Final  . Basophils Absolute 08/20/2012 0.1  0.0 - 0.1 K/uL Final  . Smear Review 08/20/2012 Criteria for review not met   Final   His labs are significant for a prediabetic blood sugar. We discussed a low carbohydrate diet at length. I also discussed increasing his aerobic exercise to try to lose 10-15 pounds. I rechecked his blood sugar in 3-6 months after making therapeutic lifestyle changes. Otherwise his immunizations are up-to-date. I will schedule a GI consult to arrange a colonoscopy.

## 2012-09-11 ENCOUNTER — Telehealth: Payer: Self-pay | Admitting: Family Medicine

## 2012-09-11 MED ORDER — OLMESARTAN MEDOXOMIL-HCTZ 40-25 MG PO TABS: 1.0000 | ORAL_TABLET | Freq: Every day | ORAL | Status: DC

## 2012-09-11 NOTE — Telephone Encounter (Signed)
Called pharmacy and they stated that the problem had been taken care of

## 2012-09-11 NOTE — Telephone Encounter (Signed)
Meds refilled.

## 2012-10-08 ENCOUNTER — Telehealth: Payer: Self-pay | Admitting: Family Medicine

## 2012-10-08 MED ORDER — FLUTICASONE PROPIONATE 50 MCG/ACT NA SUSP: 2.0000 | Freq: Every evening | NASAL | Status: DC

## 2012-10-08 NOTE — Telephone Encounter (Signed)
Medication refilled per protocol. 

## 2012-10-15 ENCOUNTER — Encounter: Payer: Self-pay | Admitting: Gastroenterology

## 2012-10-31 ENCOUNTER — Encounter: Payer: Self-pay | Admitting: Family Medicine

## 2012-10-31 ENCOUNTER — Ambulatory Visit (INDEPENDENT_AMBULATORY_CARE_PROVIDER_SITE_OTHER): Payer: Medicare Other | Admitting: Family Medicine

## 2012-10-31 VITALS — BP 90/60 | HR 80 | Temp 98.0°F | Resp 16 | Wt 247.0 lb

## 2012-10-31 DIAGNOSIS — R55 Syncope and collapse: Secondary | ICD-10-CM

## 2012-10-31 LAB — CBC WITH DIFFERENTIAL/PLATELET
Basophils Relative: 1 % (ref 0–1)
Eosinophils Absolute: 0.1 10*3/uL (ref 0.0–0.7)
Hemoglobin: 15.5 g/dL (ref 13.0–17.0)
MCH: 29.2 pg (ref 26.0–34.0)
MCHC: 33.6 g/dL (ref 30.0–36.0)
Monocytes Relative: 9 % (ref 3–12)
Neutrophils Relative %: 67 % (ref 43–77)
Platelets: 216 10*3/uL (ref 150–400)
RDW: 13.3 % (ref 11.5–15.5)

## 2012-10-31 LAB — COMPLETE METABOLIC PANEL WITH GFR
Alkaline Phosphatase: 44 U/L (ref 39–117)
BUN: 18 mg/dL (ref 6–23)
GFR, Est Non African American: 62 mL/min
Glucose, Bld: 109 mg/dL — ABNORMAL HIGH (ref 70–99)
Sodium: 142 mEq/L (ref 135–145)
Total Bilirubin: 0.8 mg/dL (ref 0.3–1.2)
Total Protein: 7 g/dL (ref 6.0–8.3)

## 2012-10-31 NOTE — Progress Notes (Signed)
Subjective:    Patient ID: Henry Martin, male    DOB: 1940/09/22, 72 y.o.   MRN: [id number]  HPI Patient is a 72 year old male who has a history of paroxysmal fibrillation. He is currently on xarelto to prevent stroke. His rate is well controlled on metoprolol. Since last office visit, the patient has been actively trying to lose weight and has lost 7-8 pounds. He is also working in the heat compress every day. His wife does not think he drinks enough fluid. Incidentally noted is the recently. This morning he awoke to use the restroom. He was sitting on the toilet voiding when he became lightheaded.  When he stood to go back to the bed he almost lost consciousness. He did not lose consciousness but he did fall. He denied any vertigo. He feels fine now but his blood pressure is better at 90/60. He denies any chest pain shortness of breath or dyspnea on exertion. He denies any palpitations. He denies any seizure activity or incontinence. He did not bite his tongue.  Echo from 5/13 revealed: Impressions:  - Normal LV size with mild LV hypertrophy. Normal LV systolic function, EF 55-60%. Moderate diastolic dysfunction. Normal RV size and systolic function. No significant valvular abnormalities.  Otherwise he's been doing well. Past Medical History  Diagnosis Date  . HTN (hypertension)   . HLD (hyperlipidemia)   . Renal cell cancer, right 2000    s/p partial neprhectomy   . CAD (coronary artery disease)     mild nonobstructive by cath '05 (ASTEROID trial - mLAD 30%, CFX 20-30%, OM 20%, mRCA 30%, normal LVF), EF 65% by echo '12;    . Atrial fibrillation with rapid ventricular response 07/04/11    Xarelto started 4/13;    . Complication of anesthesia     "bowels fall asleep & he can't have BM"  . OSA (obstructive sleep apnea)     "suppose to be wearing my mask"  . GERD (gastroesophageal reflux disease) 07/04/11    "once in awhile; haven't been treated for it"  . Stroke 2007    denies residual   . Renal cell carcinoma   . Skin cancer 06/2011    left ear   Past Surgical History  Procedure Laterality Date  . Thyroidectomy, partial  ~ 2007  . Partial nephrectomy  2000    right; "took off  25%"  . Total knee arthroplasty  2010    left  . Tonsillectomy and adenoidectomy      "as a child"  . Knee arthroscopy  1990's    left  . Cardiac catheterization  ~ 2007   Current Outpatient Prescriptions on File Prior to Visit  Medication Sig Dispense Refill  . atorvastatin (LIPITOR) 20 MG tablet Take 1 tablet (20 mg total) by mouth daily.  90 tablet  4  . Cholecalciferol (VITAMIN D3) 1000 UNITS CAPS Take 2 capsules by mouth daily.       Marland Kitchen doxazosin (CARDURA) 2 MG tablet Take 1 tablet (2 mg total) by mouth daily.  90 tablet  4  . Fish Oil OIL Take 800 mg by mouth daily.      . fluticasone (FLONASE) 50 MCG/ACT nasal spray Place 2 sprays into the nose every evening.  48 g  4  . hydrochlorothiazide (MICROZIDE) 12.5 MG capsule Take 1 capsule (12.5 mg total) by mouth as needed. Only when pt does not have benicar  90 capsule  4  . metoprolol succinate (TOPROL-XL) 25 MG 24 hr tablet  Take 1 tablet (25 mg total) by mouth daily.  90 tablet  4  . olmesartan-hydrochlorothiazide (BENICAR HCT) 40-25 MG per tablet Take 1 tablet by mouth daily. 1/2 tab qhs  45 tablet  2  . Rivaroxaban (XARELTO) 20 MG TABS Take 1 tablet (20 mg total) by mouth daily with supper.  90 tablet  4  . tadalafil (CIALIS) 20 MG tablet Take 20 mg by mouth daily as needed for erectile dysfunction.       No current facility-administered medications on file prior to visit.   Allergies  Allergen Reactions  . Contrast Media [Iodinated Diagnostic Agents] Other (See Comments)    "Isovue"; "blisters; all down my legs"   History   Social History  . Marital Status: Married    Spouse Name: N/A    Number of Children: N/A  . Years of Education: N/A   Occupational History  . Not on file.   Social History Main Topics  . Smoking  status: Former Smoker -- 1.00 packs/day for 54 years    Types: Cigarettes    Quit date: 04/04/1977  . Smokeless tobacco: Former Neurosurgeon    Types: Chew    Quit date: 10/04/2005  . Alcohol Use: Yes     Comment: 07/04/11 "might have had glass of wine 04/2011"  . Drug Use: No  . Sexual Activity: Yes     Comment: married happily to [place]   Other Topics Concern  . Not on file   Social History Narrative   Retired. Lives in [place]   Family History  Problem Relation Age of Onset  . Heart failure Mother     died at 60  . Diabetes Mother   . Cancer Neg Hx     no colon or prostate cancer      Review of Systems  All other systems reviewed and are negative.       Objective:   Physical Exam  Vitals reviewed. Constitutional: He appears well-developed and well-nourished.  HENT:  Nose: Nose normal.  Mouth/Throat: Oropharynx is clear and moist. No oropharyngeal exudate.  Neck: Neck supple. No JVD present. No thyromegaly present.  Cardiovascular: Normal rate, regular rhythm, normal heart sounds and intact distal pulses.  Exam reveals no gallop and no friction rub.   No murmur heard. Pulmonary/Chest: Effort normal and breath sounds normal. No respiratory distress. He has no wheezes. He has no rales. He exhibits no tenderness.  Abdominal: Soft. Bowel sounds are normal. He exhibits no distension and no mass. There is no tenderness. There is no rebound and no guarding.  Musculoskeletal: Normal range of motion. He exhibits no edema.  Lymphadenopathy:    He has no cervical adenopathy.  Skin: Skin is warm. No rash noted. No erythema. No pallor.          Assessment & Plan:  1. Syncope and collapse I believe the patient had a vasovagal episode secondary to micturition complicated by hypotension due to his recent weight loss and dehydration.  I will obtain a holter monitor.  Check a CBC rule out anemia and a CMP to rule out left-to-right disturbances. I have asked the patient to  discontinue doxazosin and hydrochlorothiazide. I have asked him to monitor his blood pressure closely over the next week. I do not feel at this time he needs an echocardiogram as I really believe his symptoms are primarily due to low blood pressure. If symptoms return I would recommend an echocardiogram and cardiology evaluation. - CBC with Differential - COMPLETE  METABOLIC PANEL WITH GFR

## 2012-11-05 ENCOUNTER — Ambulatory Visit: Payer: Medicare Other | Admitting: Family Medicine

## 2012-11-05 VITALS — BP 138/82

## 2012-11-05 DIAGNOSIS — R55 Syncope and collapse: Secondary | ICD-10-CM

## 2012-11-05 NOTE — Progress Notes (Signed)
Patient ID: Henry Martin, male   DOB: 1940/07/31, 72 y.o.   MRN: [id number] Pt had Holter Monitor applied with out difficulty.  Explain to return tomorrow same time to have removed.  Given diary sheet to log any cardiac symptoms, if any, while monitor on.  Instructed not to get equipment wet.  BP taken see vital signs.  Pt has question about his readings and his  machine at home.  Told patient to bring machine back with him  tomorrow and nurse can check his BP between machine and nurse while here.

## 2012-11-06 ENCOUNTER — Ambulatory Visit (INDEPENDENT_AMBULATORY_CARE_PROVIDER_SITE_OTHER): Payer: Medicare Other | Admitting: Family Medicine

## 2012-11-06 DIAGNOSIS — R55 Syncope and collapse: Secondary | ICD-10-CM

## 2012-11-06 NOTE — Progress Notes (Signed)
Patient ID: Henry Martin, male   DOB: Mar 05, 1941, 72 y.o.   MRN: [id number] Holter monitor removed without problem.  Told patient takes 1-2 weeks to get results.  Patient brought his own BP cuff from home.  Took his BP with his machine and then I took BP manually.  His cuff read a little higher then nurse reading.  Patient cuff from home a normal size cuff.  Told patient this can lead to higher readings. My reading was with adult large cuff.  Patient repots no further spells which brought him to see provider initially.  Told patient to continue to monitor his BP's at home and keep a log of readings.  Consider getting larger cuff.  Make follow up appt in 2-3 weeks to see provider about Holter reports and review BP.

## 2012-11-08 ENCOUNTER — Other Ambulatory Visit (HOSPITAL_COMMUNITY): Payer: Self-pay | Admitting: Urology

## 2012-11-08 ENCOUNTER — Ambulatory Visit (HOSPITAL_COMMUNITY)
Admission: RE | Admit: 2012-11-08 | Discharge: 2012-11-08 | Disposition: A | Discharge status: Home / Self Care | Payer: Medicare Other | Source: Ambulatory Visit | Attending: Urology | Admitting: Urology

## 2012-11-08 DIAGNOSIS — Z85528 Personal history of other malignant neoplasm of kidney: Secondary | ICD-10-CM

## 2012-11-08 DIAGNOSIS — C649 Malignant neoplasm of unspecified kidney, except renal pelvis: Secondary | ICD-10-CM | POA: Insufficient documentation

## 2012-11-08 DIAGNOSIS — Z87891 Personal history of nicotine dependence: Secondary | ICD-10-CM | POA: Insufficient documentation

## 2012-11-08 DIAGNOSIS — I10 Essential (primary) hypertension: Secondary | ICD-10-CM | POA: Insufficient documentation

## 2012-11-25 ENCOUNTER — Other Ambulatory Visit: Payer: Self-pay | Admitting: Family Medicine

## 2012-12-16 ENCOUNTER — Encounter: Payer: Self-pay | Admitting: Gastroenterology

## 2012-12-26 ENCOUNTER — Other Ambulatory Visit: Payer: Self-pay | Admitting: *Deleted

## 2012-12-26 ENCOUNTER — Telehealth: Payer: Self-pay | Admitting: *Deleted

## 2012-12-26 ENCOUNTER — Encounter: Payer: Self-pay | Admitting: Gastroenterology

## 2012-12-26 ENCOUNTER — Ambulatory Visit (INDEPENDENT_AMBULATORY_CARE_PROVIDER_SITE_OTHER): Payer: Medicare Other | Admitting: Gastroenterology

## 2012-12-26 VITALS — BP 140/66 | HR 72 | Ht 72.0 in | Wt 240.8 lb

## 2012-12-26 DIAGNOSIS — Z860100 Personal history of colon polyps, unspecified: Secondary | ICD-10-CM | POA: Insufficient documentation

## 2012-12-26 DIAGNOSIS — D689 Coagulation defect, unspecified: Secondary | ICD-10-CM

## 2012-12-26 MED ORDER — NA SULFATE-K SULFATE-MG SULF 17.5-3.13-1.6 GM/177ML PO SOLN: 1.0000 | Freq: Once | ORAL | Status: DC

## 2012-12-26 NOTE — Patient Instructions (Signed)
You have been scheduled for a colonoscopy with propofol. Please follow written instructions given to you at your visit today.  Please pick up your prep kit at the pharmacy within the next 1-3 days. [facility]. If you use inhalers (even only as needed), please bring them with you on the day of your procedure.

## 2012-12-26 NOTE — Telephone Encounter (Signed)
Received a note from Dr. Tanya Nones advising the patient can hold the Xarelto 48 hours prior to the procedure.  Will notify the patient to advise.

## 2012-12-26 NOTE — Progress Notes (Signed)
12/26/2012 [place] WENZLICK [id number] [birth year]   HISTORY OF PRESENT ILLNESS:  Patient is a pleasant 72 year old male who is known to Dr. Arlyce Dice.  He had a colonoscopy in 01/2003 at which time he had a couple of colon polyps removed, but they were hyperplastic colon polyps, therefore, it was recommended that he have a repeat in 10 years from that time.  He also had diverticulosis and internal hemorrhoids.  He comes in today to schedule his colonoscopy.  He does not have any complaints at this time.  He has seen some bright red blood on the toilet paper in the past, but nothing recently.  No family history of colon cancer.  He is on Xarelto for atrial fibrillation that occurred in 06/2011.    Past Medical History  Diagnosis Date  . HTN (hypertension)   . HLD (hyperlipidemia)   . Renal cell cancer, right 2000    s/p partial neprhectomy   . CAD (coronary artery disease)     mild nonobstructive by cath '05 (ASTEROID trial - mLAD 30%, CFX 20-30%, OM 20%, mRCA 30%, normal LVF), EF 65% by echo '12;    . Atrial fibrillation with rapid ventricular response 07/04/11    Xarelto started 4/13;    . Complication of anesthesia     "bowels fall asleep & he can't have BM"  . OSA (obstructive sleep apnea)     "suppose to be wearing my mask"  . GERD (gastroesophageal reflux disease) 07/04/11    "once in awhile; haven't been treated for it"  . Stroke 2007    denies residual  . Renal cell carcinoma   . Skin cancer 06/2011    left ear   Past Surgical History  Procedure Laterality Date  . Thyroidectomy, partial  ~ 2007  . Partial nephrectomy Right 2000    "took off  25%"  . Total knee arthroplasty Left 2010  . Tonsillectomy and adenoidectomy      "as a child"  . Knee arthroscopy Left 1990's  . Cardiac catheterization  ~ 2007    reports that he quit smoking about 35 years ago. His smoking use included Cigarettes. He has a 54 pack-year smoking history. He quit smokeless tobacco use about 7 years ago. His  smokeless tobacco use included Chew. He reports that he drinks alcohol. He reports that he does not use illicit drugs. family history includes Diabetes in his mother; Heart failure in his mother; Kidney cancer in his father. There is no history of Colon cancer or Prostate cancer. Allergies  Allergen Reactions  . Contrast Media [Iodinated Diagnostic Agents] Other (See Comments)    "Isovue"; "blisters; all down my legs"      Outpatient Encounter Prescriptions as of 12/26/2012  Medication Sig Dispense Refill  . atorvastatin (LIPITOR) 20 MG tablet Take 1 tablet (20 mg total) by mouth daily.  90 tablet  4  . BENICAR HCT 40-25 MG per tablet TAKE 1 TABLET BY MOUTH ONCE A DAY  90 tablet  1  . Cholecalciferol (VITAMIN D3) 1000 UNITS CAPS Take 2 capsules by mouth daily.       . Fish Oil OIL Take 800 mg by mouth daily.      . fluticasone (FLONASE) 50 MCG/ACT nasal spray Place 2 sprays into the nose every evening.  48 g  4  . metoprolol succinate (TOPROL-XL) 25 MG 24 hr tablet Take 1 tablet (25 mg total) by mouth daily.  90 tablet  4  . Rivaroxaban (XARELTO) 20  MG TABS Take 1 tablet (20 mg total) by mouth daily with supper.  90 tablet  4  . tadalafil (CIALIS) 20 MG tablet Take 20 mg by mouth daily as needed for erectile dysfunction.      . [DISCONTINUED] doxazosin (CARDURA) 2 MG tablet Take 1 tablet (2 mg total) by mouth daily.  90 tablet  4  . [DISCONTINUED] hydrochlorothiazide (MICROZIDE) 12.5 MG capsule Take 1 capsule (12.5 mg total) by mouth as needed. Only when pt does not have benicar  90 capsule  4   No facility-administered encounter medications on file as of 12/26/2012.     REVIEW OF SYSTEMS  : All other systems reviewed and negative except where noted in the History of Present Illness.   PHYSICAL EXAM: BP 140/66  Pulse 72  Ht 6' (1.829 m)  Wt 240 lb 12.8 oz (109.226 kg)  BMI 32.65 kg/m2 General: Well developed white male in no acute distress Head: Normocephalic and atraumatic Eyes:   Sclerae anicteric, conjunctiva pink. Ears: Normal auditory acuity Lungs: Clear throughout to auscultation Heart: Regular rate and rhythm Abdomen: Soft, non-distended.  BS present.  Non-tender. Rectal: Deferred.  Will be performed at the time of colonoscopy. Musculoskeletal: Symmetrical with no gross deformities  Skin: No lesions on visible extremities Extremities: No edema  Neurological: Alert oriented x 4, grossly nonfocal Psychological:  Alert and cooperative. Normal mood and affect  ASSESSMENT AND PLAN: -Personal history of colon polyps:  Screening/surveillance colonoscopy will be scheduled.  The risks, benefits, and alternatives were discussed with the patient and he consents to proceed.  The risks benefits and alternatives to a temporary hold of anti-coagulants/anti-platelets for the procedure were discussed with the patient he consents to proceed. Obtain clearance from Dr. Jens Som or Dr. Tanya Nones.

## 2012-12-26 NOTE — Telephone Encounter (Signed)
12/26/2012   RE: Henry Martin DOB: Dec 15, 1940 MRN: [id number]   Dear Dr. Broadus John T. Pickard,    We have scheduled the above patient for an endoscopic procedure. Our records show that he is on anticoagulation therapy.   Please advise as to how long the patient may come off his therapy of Xarelto prior to the procedure, which is scheduled for 01-07-2013.Marland Kitchen  Please fax back/ or route the completed form to [facility] CMA at [phone].   Sincerely,    Doug Sou PA-C    Lowry Ram CMA

## 2012-12-26 NOTE — Progress Notes (Signed)
Reviewed and agree with management. Robert D. Kaplan, M.D., FACG  

## 2012-12-26 NOTE — Telephone Encounter (Signed)
I did not receive a form, but [name]. Mergenthaler can discontinue xarelto 48 hours prior to procedure.  Thank you.  Sincerely, Gilmore Laroche, MD

## 2012-12-31 ENCOUNTER — Telehealth: Payer: Self-pay | Admitting: *Deleted

## 2012-12-31 NOTE — Telephone Encounter (Signed)
Dr. Tanya Nones responded to me regarding the Xarelto medication and the patient's procedure.  The patient is to hold the Xarelto on 11-2 and 11-3, and 11-4.  He can resume it on 01-08-2013. The patient repeated the instructions back to me with understanding.

## 2013-01-07 ENCOUNTER — Ambulatory Visit (AMBULATORY_SURGERY_CENTER): Payer: Medicare Other | Admitting: Gastroenterology

## 2013-01-07 ENCOUNTER — Encounter: Payer: Self-pay | Admitting: Gastroenterology

## 2013-01-07 VITALS — BP 125/78 | HR 67 | Temp 96.5°F | Resp 15 | Ht 72.0 in | Wt 240.0 lb

## 2013-01-07 DIAGNOSIS — D126 Benign neoplasm of colon, unspecified: Secondary | ICD-10-CM

## 2013-01-07 DIAGNOSIS — K648 Other hemorrhoids: Secondary | ICD-10-CM

## 2013-01-07 DIAGNOSIS — Z86010 Personal history of colonic polyps: Secondary | ICD-10-CM

## 2013-01-07 MED ORDER — SODIUM CHLORIDE 0.9 % IV SOLN: 500.0000 mL | INTRAVENOUS | Status: DC

## 2013-01-07 NOTE — Progress Notes (Signed)
Patient did not experience any of the following events: a burn prior to discharge; a fall within the facility; wrong site/side/patient/procedure/implant event; or a hospital transfer or hospital admission upon discharge from the facility. (G8907) Patient did not have preoperative order for IV antibiotic SSI prophylaxis. (G8918)  

## 2013-01-07 NOTE — Progress Notes (Signed)
   [facility] Anesthesia Post-op Note  Patient: Henry Martin  Procedure(s) Performed: colonoscopy  Patient Location: [facility] - Recovery Area  Anesthesia Type: Deep Sedation/Propofol  Level of Consciousness: awake, oriented and patient cooperative  Airway and Oxygen Therapy: Patient Spontanous Breathing  Post-op Pain: none  Post-op Assessment:  Post-op Vital signs reviewed, Patient's Cardiovascular Status Stable, Respiratory Function Stable, Patent Airway, No signs of Nausea or vomiting and Pain level controlled  Post-op Vital Signs: Reviewed and stable  Complications: No apparent anesthesia complications  Reiko Vinje E 3:27 PM

## 2013-01-07 NOTE — Patient Instructions (Addendum)

## 2013-01-07 NOTE — Progress Notes (Signed)
Called to room to assist during endoscopic procedure.  Patient ID and intended procedure confirmed with present staff. Received instructions for my participation in the procedure from the performing physician.  

## 2013-01-07 NOTE — Op Note (Signed)
[facility] [address] [place], [address]   COLONOSCOPY PROCEDURE REPORT  PATIENT: Henry Martin, Henry Martin  MR#: [id number] BIRTHDATE: 1940/08/05 , 72  yrs. old GENDER: Male ENDOSCOPIST: Louis Meckel, MD REFERRED BY: Lynnea Ferrier PROCEDURE DATE:  01/07/2013 PROCEDURE:   Colonoscopy with cold biopsy polypectomy First Screening Colonoscopy - Avg.  risk and is 50 yrs.  old or older - No.  Prior Negative Screening - Now for repeat screening. 10 or more years since last screening  History of Adenoma - Now for follow-up colonoscopy & has been > or = to 3 yrs.  N/A  Polyps Removed Today? Yes. ASA CLASS:   Class II INDICATIONS:Average risk patient for colon cancer. MEDICATIONS: MAC sedation, administered by CRNA and propofol (Diprivan) 200mg  IV  DESCRIPTION OF PROCEDURE:   After the risks benefits and alternatives of the procedure were thoroughly explained, informed consent was obtained.  A digital rectal exam revealed no abnormalities of the rectum.   The LB [id number]  endoscope was introduced through the anus and advanced to the cecum, which was identified by both the appendix and ileocecal valve. No adverse events experienced.   The quality of the prep was Suprep good  The instrument was then slowly withdrawn as the colon was fully examined.      COLON FINDINGS: A sessile polyp measuring 2 mm in size was found in the ascending colon.  A polypectomy was performed with cold forceps. The exam was otherwise normal Retroflexed views revealed internal hemorrhoids. The time to cecum=3 minutes 31 seconds. Withdrawal time=10 minutes 07 seconds.  The scope was withdrawn and the procedure completed. COMPLICATIONS: There were no complications.  ENDOSCOPIC IMPRESSION: Sessile polyp measuring 2 mm in size was found in the ascending colon; polypectomy was performed with cold forceps Internal hemorrhoids  RECOMMENDATIONS: If the polyp(s) removed today are proven  to be adenomatous (pre-cancerous) polyps, you will need a repeat colonoscopy in 5 years.  Otherwise you should continue to follow colorectal cancer screening guidelines for "routine risk" patients with colonoscopy in 10 years.  You will receive a letter within 1-2 weeks with the results of your biopsy as well as final recommendations.  Please call my office if you have not received a letter after 3 weeks. Resume xarelto today   eSigned:  Louis Meckel, MD 01/07/2013 3:29 PM   cc:   PATIENT NAME:  Bharath, Bernstein MR#: [id number]

## 2013-01-08 ENCOUNTER — Encounter: Payer: Self-pay | Admitting: Gastroenterology

## 2013-01-08 ENCOUNTER — Telehealth: Payer: Self-pay | Admitting: *Deleted

## 2013-01-08 NOTE — Telephone Encounter (Signed)
  Follow up Call-  Call back number 01/07/2013  Post procedure Call Back phone  # [phone]  Permission to leave phone message Yes     Patient questions:  Do you have a fever, pain , or abdominal swelling? no Pain Score  0 *  Have you tolerated food without any problems? yes  Have you been able to return to your normal activities? yes  Do you have any questions about your discharge instructions: Diet   no Medications  no Follow up visit  no  Do you have questions or concerns about your Care? no  Actions: * If pain score is 4 or above: No action needed, pain <4.

## 2013-01-14 ENCOUNTER — Encounter: Payer: Self-pay | Admitting: Gastroenterology

## 2013-01-17 ENCOUNTER — Ambulatory Visit (INDEPENDENT_AMBULATORY_CARE_PROVIDER_SITE_OTHER): Payer: Medicare Other | Admitting: Family Medicine

## 2013-01-17 DIAGNOSIS — Z23 Encounter for immunization: Secondary | ICD-10-CM

## 2013-07-09 ENCOUNTER — Ambulatory Visit (INDEPENDENT_AMBULATORY_CARE_PROVIDER_SITE_OTHER): Payer: Medicare Other | Admitting: Physician Assistant

## 2013-07-09 ENCOUNTER — Encounter: Payer: Self-pay | Admitting: Physician Assistant

## 2013-07-09 VITALS — BP 156/94 | HR 88 | Temp 100.3°F | Resp 18 | Wt 228.0 lb

## 2013-07-09 DIAGNOSIS — J329 Chronic sinusitis, unspecified: Secondary | ICD-10-CM

## 2013-07-09 DIAGNOSIS — J209 Acute bronchitis, unspecified: Secondary | ICD-10-CM

## 2013-07-09 MED ORDER — LEVOFLOXACIN 750 MG PO TABS: 750.0000 mg | ORAL_TABLET | Freq: Every day | ORAL | Status: DC

## 2013-07-09 MED ORDER — PREDNISONE 20 MG PO TABS: ORAL_TABLET | ORAL | Status: DC

## 2013-07-09 NOTE — Progress Notes (Signed)
Patient ID: Henry Martin MRN: [id number], DOB: 08/10/1940, 73 y.o. Date of Encounter: 07/09/2013, 4:50 PM    Chief Complaint:  Chief Complaint  Patient presents with  . c/o bad sinus infection x 1 week    green bloody drainage     HPI: 73 y.o. year old white male reports that he has been sick with these symptoms for 10 days. Says it is getting worse. Says that he is blowing thick dark nasty mucus from his nose. Says that his sinuses hurt and even his teeth ache. Says that he also has a lot of chest congestion and is coughing up a lot of thick dark phlegm. Also says that it almost sounds as if he is wheezing at times and sounds congested in his chest when he takes a deep breath. Had some low-grade fevers that have the last day or 2. Says he quit smoking in 1979. Says that he is supposed to be leaving in 4 days for a [place] trip [place]. "Has to get to feeling better before that trip."     Home Meds: See attached medication section for any medications that were entered at today's visit. The computer does not put those onto this list.The following list is a list of meds entered prior to today's visit.   Current Outpatient Prescriptions on File Prior to Visit  Medication Sig Dispense Refill  . atorvastatin (LIPITOR) 20 MG tablet Take 1 tablet (20 mg total) by mouth daily.  90 tablet  4  . BENICAR HCT 40-25 MG per tablet TAKE 1 TABLET BY MOUTH ONCE A DAY  90 tablet  1  . Cholecalciferol (VITAMIN D3) 1000 UNITS CAPS Take 2 capsules by mouth daily.       . Fish Oil OIL Take 800 mg by mouth daily.      . fluticasone (FLONASE) 50 MCG/ACT nasal spray Place 2 sprays into the nose every evening.  48 g  4  . metoprolol succinate (TOPROL-XL) 25 MG 24 hr tablet Take 1 tablet (25 mg total) by mouth daily.  90 tablet  4  . Rivaroxaban (XARELTO) 20 MG TABS Take 1 tablet (20 mg total) by mouth daily with supper.  90 tablet  4  . tadalafil (CIALIS) 20 MG tablet Take 20 mg by mouth daily  as needed for erectile dysfunction.       No current facility-administered medications on file prior to visit.    Allergies:  Allergies  Allergen Reactions  . Contrast Media [Iodinated Diagnostic Agents] Other (See Comments)    "Isovue"; "blisters; all down my legs"      Review of Systems: See HPI for pertinent ROS. All other ROS negative.    Physical Exam: Blood pressure 156/94, pulse 88, temperature 100.3 F (37.9 C), temperature source Oral, resp. rate 18, weight 228 lb (103.42 kg)., Body mass index is 30.92 kg/(m^2). General:  WNWD WM. Appears in no acute distress. HEENT: Normocephalic, atraumatic, eyes without discharge, sclera non-icteric, nares are without discharge. Bilateral auditory canals clear, TM's are without perforation, pearly grey and translucent with reflective cone of light bilaterally. Oral cavity moist, posterior pharynx without exudate, erythema, peritonsillar abscess. He is tender with percussion in bilateral maxillary sinuses. No tenderness with percussion of frontal sinuses.  Neck: Supple. No thyromegaly. No lymphadenopathy. Lungs: He has very faint wheezes scattered throughout bilaterally. Heart: Regular rhythm. No murmurs, rubs, or gallops. Msk:  Strength and tone normal for age. Extremities/Skin: Warm and dry.  Neuro:  Alert and oriented X 3. Moves all extremities spontaneously. Gait is normal. CNII-XII grossly in tact. Psych:  Responds to questions appropriately with a normal affect.     ASSESSMENT AND PLAN:  73 y.o. year old male with  1. Sinusitis - levofloxacin (LEVAQUIN) 750 MG tablet; Take 1 tablet (750 mg total) by mouth daily.  Dispense: 10 tablet; Refill: 0 - predniSONE (DELTASONE) 20 MG tablet; Take 3 daily for 2 days, then 2 daily for 2 days, then 1 daily for 2 days.  Dispense: 12 tablet; Refill: 0  2. Acute bronchitis - levofloxacin (LEVAQUIN) 750 MG tablet; Take 1 tablet (750 mg total) by mouth daily.  Dispense: 10 tablet; Refill: 0 -  predniSONE (DELTASONE) 20 MG tablet; Take 3 daily for 2 days, then 2 daily for 2 days, then 1 daily for 2 days.  Dispense: 12 tablet; Refill: 0  He is to take the above medications as prescribed. Also use Mucinex DM as an expectorant. F/U if  symptoms worsen or his symptoms do not resolve with completion of these medications.  [address] [place], [place], [facility] 07/09/2013 4:50 PM

## 2013-08-21 ENCOUNTER — Other Ambulatory Visit: Payer: Medicare Other

## 2013-08-21 DIAGNOSIS — I10 Essential (primary) hypertension: Secondary | ICD-10-CM

## 2013-08-21 DIAGNOSIS — Z0000 Encounter for general adult medical examination without abnormal findings: Secondary | ICD-10-CM

## 2013-08-21 DIAGNOSIS — E785 Hyperlipidemia, unspecified: Secondary | ICD-10-CM

## 2013-08-21 LAB — COMPLETE METABOLIC PANEL WITH GFR
ALT: 17 U/L (ref 0–53)
AST: 18 U/L (ref 0–37)
Albumin: 3.5 g/dL (ref 3.5–5.2)
Alkaline Phosphatase: 43 U/L (ref 39–117)
BUN: 12 mg/dL (ref 6–23)
CHLORIDE: 107 meq/L (ref 96–112)
CO2: 27 meq/L (ref 19–32)
CREATININE: 1 mg/dL (ref 0.50–1.35)
Calcium: 8.9 mg/dL (ref 8.4–10.5)
GFR, EST AFRICAN AMERICAN: 87 mL/min
GFR, Est Non African American: 75 mL/min
Glucose, Bld: 96 mg/dL (ref 70–99)
POTASSIUM: 4.8 meq/L (ref 3.5–5.3)
Sodium: 140 mEq/L (ref 135–145)
Total Bilirubin: 0.7 mg/dL (ref 0.2–1.2)
Total Protein: 6 g/dL (ref 6.0–8.3)

## 2013-08-21 LAB — CBC WITH DIFFERENTIAL/PLATELET
Basophils Absolute: 0.1 10*3/uL (ref 0.0–0.1)
Basophils Relative: 1 % (ref 0–1)
Eosinophils Absolute: 0.1 10*3/uL (ref 0.0–0.7)
Eosinophils Relative: 2 % (ref 0–5)
HEMATOCRIT: 41.8 % (ref 39.0–52.0)
HEMOGLOBIN: 13.8 g/dL (ref 13.0–17.0)
LYMPHS ABS: 2.4 10*3/uL (ref 0.7–4.0)
LYMPHS PCT: 37 % (ref 12–46)
MCH: 28.8 pg (ref 26.0–34.0)
MCHC: 33 g/dL (ref 30.0–36.0)
MCV: 87.1 fL (ref 78.0–100.0)
MONO ABS: 0.6 10*3/uL (ref 0.1–1.0)
MONOS PCT: 9 % (ref 3–12)
Neutro Abs: 3.3 10*3/uL (ref 1.7–7.7)
Neutrophils Relative %: 51 % (ref 43–77)
Platelets: 211 10*3/uL (ref 150–400)
RBC: 4.8 MIL/uL (ref 4.22–5.81)
RDW: 13.4 % (ref 11.5–15.5)
WBC: 6.5 10*3/uL (ref 4.0–10.5)

## 2013-08-21 LAB — LIPID PANEL
CHOLESTEROL: 113 mg/dL (ref 0–200)
HDL: 44 mg/dL (ref >39)
LDL CALC: 58 mg/dL (ref 0–99)
TRIGLYCERIDES: 55 mg/dL (ref <150)
Total CHOL/HDL Ratio: 2.6 Ratio
VLDL: 11 mg/dL (ref 0–40)

## 2013-08-22 LAB — PSA, MEDICARE: PSA: 3.2 ng/mL (ref <=4.00)

## 2013-09-02 ENCOUNTER — Ambulatory Visit (INDEPENDENT_AMBULATORY_CARE_PROVIDER_SITE_OTHER): Payer: Medicare Other | Admitting: Family Medicine

## 2013-09-02 ENCOUNTER — Encounter: Payer: Self-pay | Admitting: Family Medicine

## 2013-09-02 VITALS — BP 136/64 | HR 62 | Temp 97.0°F | Resp 14 | Ht 72.0 in | Wt 226.0 lb

## 2013-09-02 DIAGNOSIS — Z23 Encounter for immunization: Secondary | ICD-10-CM

## 2013-09-02 DIAGNOSIS — J329 Chronic sinusitis, unspecified: Secondary | ICD-10-CM

## 2013-09-02 DIAGNOSIS — Z0000 Encounter for general adult medical examination without abnormal findings: Secondary | ICD-10-CM

## 2013-09-02 MED ORDER — CEFDINIR 300 MG PO CAPS: 300.0000 mg | ORAL_CAPSULE | Freq: Two times a day (BID) | ORAL | Status: DC

## 2013-09-02 NOTE — Progress Notes (Signed)
Subjective:    Patient ID: Henry Martin, male    DOB: 14-Aug-1940, 73 y.o.   MRN: [id number]  HPI Patient is here for complete physical exam. He has had left frontal sinus pressure and pain and purulent nasal discharge now for 2 months. He continues to have frontal sinus pain. He continues to have purulent discharge. He continues to have subjective fevers and headaches. I have reviewed his last office note. He was given Levaquin which did not completely clear the infection.  His colonoscopy is up-to-date. He has prostate check by Dr. Karsten Ro.   Back is up-to-date. Patient is due for Prevnar 13. PSA is slightly elevated at 3.2. I do not have an ALT PSA to compare to. Most recent labwork as listed below. The patient has successfully lost 30 pounds over the last year due to exercise diet and lifestyle changes. Lab on 08/21/2013  Component Date Value Ref Range Status  . WBC 08/21/2013 6.5  4.0 - 10.5 K/uL Final  . RBC 08/21/2013 4.80  4.22 - 5.81 MIL/uL Final  . Hemoglobin 08/21/2013 13.8  13.0 - 17.0 g/dL Final  . HCT 08/21/2013 41.8  39.0 - 52.0 % Final  . MCV 08/21/2013 87.1  78.0 - 100.0 fL Final  . MCH 08/21/2013 28.8  26.0 - 34.0 pg Final  . MCHC 08/21/2013 33.0  30.0 - 36.0 g/dL Final  . RDW 08/21/2013 13.4  11.5 - 15.5 % Final  . Platelets 08/21/2013 211  150 - 400 K/uL Final  . Neutrophils Relative % 08/21/2013 51  43 - 77 % Final  . Neutro Abs 08/21/2013 3.3  1.7 - 7.7 K/uL Final  . Lymphocytes Relative 08/21/2013 37  12 - 46 % Final  . Lymphs Abs 08/21/2013 2.4  0.7 - 4.0 K/uL Final  . Monocytes Relative 08/21/2013 9  3 - 12 % Final  . Monocytes Absolute 08/21/2013 0.6  0.1 - 1.0 K/uL Final  . Eosinophils Relative 08/21/2013 2  0 - 5 % Final  . Eosinophils Absolute 08/21/2013 0.1  0.0 - 0.7 K/uL Final  . Basophils Relative 08/21/2013 1  0 - 1 % Final  . Basophils Absolute 08/21/2013 0.1  0.0 - 0.1 K/uL Final  . Smear Review 08/21/2013 Criteria for review not met   Final  .  Sodium 08/21/2013 140  135 - 145 mEq/L Final  . Potassium 08/21/2013 4.8  3.5 - 5.3 mEq/L Final  . Chloride 08/21/2013 107  96 - 112 mEq/L Final  . CO2 08/21/2013 27  19 - 32 mEq/L Final  . Glucose, Bld 08/21/2013 96  70 - 99 mg/dL Final  . BUN 08/21/2013 12  6 - 23 mg/dL Final  . Creat 08/21/2013 1.00  0.50 - 1.35 mg/dL Final  . Total Bilirubin 08/21/2013 0.7  0.2 - 1.2 mg/dL Final  . Alkaline Phosphatase 08/21/2013 43  39 - 117 U/L Final  . AST 08/21/2013 18  0 - 37 U/L Final  . ALT 08/21/2013 17  0 - 53 U/L Final  . Total Protein 08/21/2013 6.0  6.0 - 8.3 g/dL Final  . Albumin 08/21/2013 3.5  3.5 - 5.2 g/dL Final  . Calcium 08/21/2013 8.9  8.4 - 10.5 mg/dL Final  . GFR, Est African American 08/21/2013 87   Final  . GFR, Est Non African American 08/21/2013 75   Final   Comment:  The estimated GFR is a calculation valid for adults (>=1 years old)                          that uses the CKD-EPI algorithm to adjust for age and sex. It is                            not to be used for children, pregnant women, hospitalized patients,                             patients on dialysis, or with rapidly changing kidney function.                          According to the NKDEP, eGFR >89 is normal, 60-89 shows mild                          impairment, 30-59 shows moderate impairment, 15-29 shows severe                          impairment and <15 is ESRD.                             Marland Kitchen Cholesterol 08/21/2013 113  0 - 200 mg/dL Final   Comment: ATP III Classification:                                < 200        mg/dL        Desirable                               200 - 239     mg/dL        Borderline High                               >= 240        mg/dL        High                             . Triglycerides 08/21/2013 55  <150 mg/dL Final  . HDL 08/21/2013 44  >39 mg/dL Final  . Total CHOL/HDL Ratio 08/21/2013 2.6   Final  . VLDL 08/21/2013 11  0 - 40 mg/dL Final  .  LDL Cholesterol 08/21/2013 58  0 - 99 mg/dL Final   Comment:                            Total Cholesterol/HDL Ratio:CHD Risk                                                 Coronary Heart Disease Risk Table  Men       Women                                   1/2 Average Risk              3.4        3.3                                       Average Risk              5.0        4.4                                    2X Average Risk              9.6        7.1                                    3X Average Risk             23.4       11.0                          Use the calculated Patient Ratio above and the CHD Risk table                           to determine the patient's CHD Risk.                          ATP III Classification (LDL):                                < 100        mg/dL         Optimal                               100 - 129     mg/dL         Near or Above Optimal                               130 - 159     mg/dL         Borderline High                               160 - 189     mg/dL         High                                > 190        mg/dL         Very High                             .  PSA 08/21/2013 3.20  <=4.00 ng/mL Final   Comment: Test Methodology: ECLIA PSA (Electrochemiluminescence Immunoassay)                                                     For PSA values from 2.5-4.0, particularly in younger men <60 years                          old, the AUA and NCCN suggest testing for % Free PSA (3515) and                          evaluation of the rate of increase in PSA (PSA velocity).   Past Medical History  Diagnosis Date  . HTN (hypertension)   . HLD (hyperlipidemia)   . Renal cell cancer, right 2000    s/p partial neprhectomy   . CAD (coronary artery disease)     mild nonobstructive by cath '05 (ASTEROID trial - mLAD 30%, CFX 20-30%, OM 20%, mRCA 30%, normal LVF), EF 65% by echo '12;    .  Atrial fibrillation with rapid ventricular response 07/04/11    Xarelto started 4/13;    . Complication of anesthesia     "bowels fall asleep & he can't have BM"  . OSA (obstructive sleep apnea)     "suppose to be wearing my mask"  . GERD (gastroesophageal reflux disease) 07/04/11    "once in awhile; haven't been treated for it"  . Stroke 2007    denies residual  . Renal cell carcinoma   . Skin cancer 06/2011    left ear   Past Surgical History  Procedure Laterality Date  . Thyroidectomy, partial  ~ 2007  . Partial nephrectomy Right 2000    "took off  25%"  . Total knee arthroplasty Left 2010  . Tonsillectomy and adenoidectomy      "as a child"  . Knee arthroscopy Left 1990's  . Cardiac catheterization  ~ 2007   Current Outpatient Prescriptions on File Prior to Visit  Medication Sig Dispense Refill  . atorvastatin (LIPITOR) 20 MG tablet Take 1 tablet (20 mg total) by mouth daily.  90 tablet  4  . Cholecalciferol (VITAMIN D3) 1000 UNITS CAPS Take 2 capsules by mouth daily.       . Fish Oil OIL Take 800 mg by mouth daily.      . fluticasone (FLONASE) 50 MCG/ACT nasal spray Place 2 sprays into the nose every evening.  48 g  4  . metoprolol succinate (TOPROL-XL) 25 MG 24 hr tablet Take 1 tablet (25 mg total) by mouth daily.  90 tablet  4  . Rivaroxaban (XARELTO) 20 MG TABS Take 1 tablet (20 mg total) by mouth daily with supper.  90 tablet  4  . tadalafil (CIALIS) 20 MG tablet Take 20 mg by mouth daily as needed for erectile dysfunction.       No current facility-administered medications on file prior to visit.   Allergies  Allergen Reactions  . Contrast Media [Iodinated Diagnostic Agents] Other (See Comments)    "Isovue"; "blisters; all down my legs"   History   Social History  . Marital Status: Married    Spouse Name: N/A    Number of Children: N/A  . Years of  Education: N/A   Occupational History  . Not on file.   Social History Main Topics  . Smoking status: Former  Smoker -- 1.00 packs/day for 54 years    Types: Cigarettes    Quit date: 04/04/1977  . Smokeless tobacco: Former Neurosurgeon    Types: Chew    Quit date: 10/04/2005  . Alcohol Use: Yes     Comment: rare  . Drug Use: No  . Sexual Activity: Yes     Comment: married happily to [place]   Other Topics Concern  . Not on file   Social History Narrative   Retired. Lives in [place]   Family History  Problem Relation Age of Onset  . Heart failure Mother     died at 35  . Diabetes Mother   . Colon cancer Neg Hx   . Prostate cancer Neg Hx   . Kidney cancer Father       Review of Systems  All other systems reviewed and are negative.      Objective:   Physical Exam  Vitals reviewed. Constitutional: He is oriented to person, place, and time. He appears well-developed and well-nourished. No distress.  HENT:  Head: Normocephalic and atraumatic.  Right Ear: External ear normal.  Left Ear: External ear normal.  Nose: Nose normal.  Mouth/Throat: Oropharynx is clear and moist. No oropharyngeal exudate.  Eyes: Conjunctivae and EOM are normal. Pupils are equal, round, and reactive to light. Right eye exhibits no discharge. Left eye exhibits no discharge. No scleral icterus.  Neck: Normal range of motion. Neck supple. No JVD present. No tracheal deviation present. No thyromegaly present.  Cardiovascular: Normal rate, regular rhythm, normal heart sounds and intact distal pulses.  Exam reveals no gallop and no friction rub.   No murmur heard. Pulmonary/Chest: Effort normal and breath sounds normal. No stridor. No respiratory distress. He has no wheezes. He has no rales. He exhibits no tenderness.  Abdominal: Soft. Bowel sounds are normal. He exhibits no distension and no mass. There is no tenderness. There is no rebound and no guarding.  Musculoskeletal: Normal range of motion. He exhibits no edema and no tenderness.  Lymphadenopathy:    He has no cervical adenopathy.  Neurological: He is  alert and oriented to person, place, and time. He has normal reflexes. He displays normal reflexes. No cranial nerve deficit. He exhibits normal muscle tone. Coordination normal.  Skin: Skin is warm. No rash noted. He is not diaphoretic. No erythema. No pallor.  Psychiatric: He has a normal mood and affect. His behavior is normal. Judgment and thought content normal.          Assessment & Plan:  1. Unspecified sinusitis (chronic) - cefdinir (OMNICEF) 300 MG capsule; Take 1 capsule (300 mg total) by mouth 2 (two) times daily.  Dispense: 20 capsule; Refill: 0  2. Routine general medical examination at a health care facility Patient's lab work is excellent. Continue Lipitor 20 mg by mouth daily. Blood pressure is well controlled. My only concern on his lab work is his mildly elevated PSA of 3.2. Patient declines digital rectal exam as he is seeing his urologist in one month. He'll discuss elevated PSA with a urologist. In the past his prostate has been reported to be +2 in size and slightly enlarged. However I do not have an old PSA to compare to. Patient's immunizations are up-to-date except Prevnar 13. He will receive Prevnar 13 today in the office. The remainder of his preventative care including  his colonoscopy are up-to-date.

## 2013-09-02 NOTE — Addendum Note (Signed)
Addended by: Shary Decamp B on: 09/02/2013 08:42 AM   Modules accepted: Orders

## 2013-09-16 ENCOUNTER — Other Ambulatory Visit: Payer: Self-pay | Admitting: Family Medicine

## 2013-09-16 NOTE — Telephone Encounter (Signed)
Refill appropriate and filled per protocol. 

## 2013-10-15 ENCOUNTER — Other Ambulatory Visit: Payer: Self-pay | Admitting: Family Medicine

## 2013-10-15 NOTE — Telephone Encounter (Signed)
Refill appropriate and filled per protocol. 

## 2013-11-12 ENCOUNTER — Other Ambulatory Visit (HOSPITAL_COMMUNITY): Payer: Self-pay | Admitting: Urology

## 2013-11-12 ENCOUNTER — Ambulatory Visit (HOSPITAL_COMMUNITY)
Admission: RE | Admit: 2013-11-12 | Discharge: 2013-11-12 | Disposition: A | Discharge status: Home / Self Care | Payer: Medicare Other | Source: Ambulatory Visit | Attending: Urology | Admitting: Urology

## 2013-11-12 DIAGNOSIS — C649 Malignant neoplasm of unspecified kidney, except renal pelvis: Secondary | ICD-10-CM | POA: Diagnosis present

## 2013-11-12 DIAGNOSIS — Z9889 Other specified postprocedural states: Secondary | ICD-10-CM | POA: Diagnosis not present

## 2013-11-27 ENCOUNTER — Other Ambulatory Visit: Payer: Self-pay | Admitting: Family Medicine

## 2013-11-27 NOTE — Telephone Encounter (Signed)
Medication refilled per protocol. 

## 2013-12-08 ENCOUNTER — Encounter: Payer: Self-pay | Admitting: Family Medicine

## 2013-12-11 ENCOUNTER — Other Ambulatory Visit: Payer: Self-pay | Admitting: Family Medicine

## 2013-12-18 ENCOUNTER — Ambulatory Visit (INDEPENDENT_AMBULATORY_CARE_PROVIDER_SITE_OTHER): Payer: Medicare Other | Admitting: *Deleted

## 2013-12-18 DIAGNOSIS — Z23 Encounter for immunization: Secondary | ICD-10-CM

## 2013-12-18 NOTE — Progress Notes (Signed)
Patient ID: Henry Martin, male   DOB: May 05, 1940, 73 y.o.   MRN: [id number] Patient seen in office for Influenza Vaccination.   Tolerated IM administration well.

## 2014-01-08 ENCOUNTER — Other Ambulatory Visit: Payer: Self-pay | Admitting: Family Medicine

## 2014-02-16 ENCOUNTER — Encounter: Payer: Self-pay | Admitting: Family Medicine

## 2014-02-16 ENCOUNTER — Ambulatory Visit (INDEPENDENT_AMBULATORY_CARE_PROVIDER_SITE_OTHER): Payer: Medicare Other | Admitting: Family Medicine

## 2014-02-16 VITALS — BP 140/70 | HR 78 | Temp 98.1°F | Resp 16 | Ht 72.0 in | Wt 234.0 lb

## 2014-02-16 DIAGNOSIS — M5431 Sciatica, right side: Secondary | ICD-10-CM

## 2014-02-16 NOTE — Progress Notes (Signed)
Subjective:    Patient ID: Henry Martin, male    DOB: 1940/04/23, 73 y.o.   MRN: [id number]  HPI Patient is here today complaining of right-sided low back pain for 12 months. Pain is recently worsening. It radiates into his right posterior hip, down his right posterior thigh, and into his right foot. It is particularly worse when he is sitting for a prolonged period of time. He deals with the pain on a daily basis. He is unable to take NSAIDs due to his history of coronary artery disease and the fact that he has one functional kidney. He is afraid of ATN due to NSAID overuse.   Past Medical History  Diagnosis Date  . HTN (hypertension)   . HLD (hyperlipidemia)   . Renal cell cancer, right 2000    s/p partial neprhectomy   . CAD (coronary artery disease)     mild nonobstructive by cath '05 (ASTEROID trial - mLAD 30%, CFX 20-30%, OM 20%, mRCA 30%, normal LVF), EF 65% by echo '12;    . Atrial fibrillation with rapid ventricular response 07/04/11    Xarelto started 4/13;    . Complication of anesthesia     "bowels fall asleep & he can't have BM"  . OSA (obstructive sleep apnea)     "suppose to be wearing my mask"  . GERD (gastroesophageal reflux disease) 07/04/11    "once in awhile; haven't been treated for it"  . Stroke 2007    denies residual  . Renal cell carcinoma   . Skin cancer 06/2011    left ear   Past Surgical History  Procedure Laterality Date  . Thyroidectomy, partial  ~ 2007  . Partial nephrectomy Right 2000    "took off  25%"  . Total knee arthroplasty Left 2010  . Tonsillectomy and adenoidectomy      "as a child"  . Knee arthroscopy Left 1990's  . Cardiac catheterization  ~ 2007   Current Outpatient Prescriptions on File Prior to Visit  Medication Sig Dispense Refill  . atorvastatin (LIPITOR) 20 MG tablet TAKE 1 TABLET BY MOUTH ONCE A DAY 90 tablet 0  . BENICAR HCT 40-25 MG per tablet TAKE 1 TABLET BY MOUTH ONCE A DAY 90 tablet 0  . Cholecalciferol (VITAMIN D3)  1000 UNITS CAPS Take 2 capsules by mouth daily.     . Fish Oil OIL Take 800 mg by mouth daily.    . fluticasone (FLONASE) 50 MCG/ACT nasal spray PLACE 2 SPRAYS INTO THE NOSE EVERY EVENING. 48 g 3  . metoprolol succinate (TOPROL-XL) 25 MG 24 hr tablet TAKE 1 TABLET BY MOUTH ONCE A DAY 90 tablet 3  . olmesartan-hydrochlorothiazide (BENICAR HCT) 40-25 MG per tablet TAKE 1/2 TABLET BY MOUTH ONCE A DAY    . tadalafil (CIALIS) 20 MG tablet Take 20 mg by mouth daily as needed for erectile dysfunction.    [name] 20 MG TABS tablet TAKE 1 TABLET BY MOUTH ONCE A DAY 90 tablet 1   No current facility-administered medications on file prior to visit.   Allergies  Allergen Reactions  . Contrast Media [Iodinated Diagnostic Agents] Other (See Comments)    "Isovue"; "blisters; all down my legs"   History   Social History  . Marital Status: Married    Spouse Name: N/A    Number of Children: N/A  . Years of Education: N/A   Occupational History  . Not on file.   Social History Main Topics  . Smoking  status: Former Smoker -- 1.00 packs/day for 54 years    Types: Cigarettes    Quit date: 04/04/1977  . Smokeless tobacco: Former Systems developer    Types: Chew    Quit date: 10/04/2005  . Alcohol Use: Yes     Comment: rare  . Drug Use: No  . Sexual Activity: Yes     Comment: married happily to [place]   Other Topics Concern  . Not on file   Social History Narrative   Retired. Lives in [place]  All other systems reviewed and are negative.      Objective:   Physical Exam  Constitutional: He is oriented to person, place, and time.  Cardiovascular: Normal rate, regular rhythm and normal heart sounds.   Pulmonary/Chest: Effort normal and breath sounds normal.  Musculoskeletal: Normal range of motion. He exhibits tenderness. He exhibits no edema.  Neurological: He is oriented to person, place, and time. He has normal reflexes. He displays normal reflexes. No cranial nerve  deficit. He exhibits normal muscle tone. Coordination normal.  Vitals reviewed.         Assessment & Plan:  Right sided sciatica - Plan: MR Lumbar Spine Wo Contrast  Patient has had symptoms of sciatica for 12 months. I will proceed with an MRI of the lumbar spine. Patient would be interested in epidural steroid injection. He is currently on Xarelto for atrial fibrillation. We will need to discontinue that medication approximately 1 week prior to his steroid injection. If the patient decides to move forward with this plan, I will need to contact his cardiologist for approval prior to scheduling the steroid injection.

## 2014-02-18 ENCOUNTER — Ambulatory Visit
Admission: RE | Admit: 2014-02-18 | Discharge: 2014-02-18 | Disposition: A | Discharge status: Home / Self Care | Payer: Medicare Other | Source: Ambulatory Visit | Attending: Family Medicine | Admitting: Family Medicine

## 2014-02-18 DIAGNOSIS — M5431 Sciatica, right side: Secondary | ICD-10-CM

## 2014-02-24 ENCOUNTER — Other Ambulatory Visit: Payer: Self-pay | Admitting: Family Medicine

## 2014-02-24 DIAGNOSIS — M1990 Unspecified osteoarthritis, unspecified site: Secondary | ICD-10-CM

## 2014-03-02 ENCOUNTER — Telehealth: Payer: Self-pay | Admitting: Family Medicine

## 2014-03-02 NOTE — Telephone Encounter (Signed)
Pt came by office and you were with pt told him I would send you a message to call him back

## 2014-03-02 NOTE — Telephone Encounter (Signed)
Patient says he is returning your call? [phone]

## 2014-03-02 NOTE — Telephone Encounter (Signed)
LMTRC

## 2014-03-03 NOTE — Telephone Encounter (Signed)
Patient aware of results on 03/02/14

## 2014-03-09 ENCOUNTER — Other Ambulatory Visit: Payer: Self-pay | Admitting: Family Medicine

## 2014-03-10 ENCOUNTER — Encounter: Payer: Self-pay | Admitting: Family Medicine

## 2014-03-10 ENCOUNTER — Telehealth: Payer: Self-pay | Admitting: Family Medicine

## 2014-03-10 NOTE — Telephone Encounter (Signed)
Error dupilcate request

## 2014-03-10 NOTE — Telephone Encounter (Signed)
Medication refill for one time only.  Patient needs to be seen.  Letter sent for patient to call and schedule 

## 2014-03-12 ENCOUNTER — Other Ambulatory Visit: Payer: Self-pay | Admitting: Family Medicine

## 2014-03-12 NOTE — Telephone Encounter (Signed)
Refill appropriate and filled per protocol. 

## 2014-03-16 DIAGNOSIS — M5416 Radiculopathy, lumbar region: Secondary | ICD-10-CM | POA: Diagnosis not present

## 2014-03-16 DIAGNOSIS — M5136 Other intervertebral disc degeneration, lumbar region: Secondary | ICD-10-CM | POA: Diagnosis not present

## 2014-03-18 DIAGNOSIS — M2560 Stiffness of unspecified joint, not elsewhere classified: Secondary | ICD-10-CM | POA: Diagnosis not present

## 2014-03-18 DIAGNOSIS — M6249 Contracture of muscle, multiple sites: Secondary | ICD-10-CM | POA: Diagnosis not present

## 2014-03-18 DIAGNOSIS — M5441 Lumbago with sciatica, right side: Secondary | ICD-10-CM | POA: Diagnosis not present

## 2014-03-18 DIAGNOSIS — M6281 Muscle weakness (generalized): Secondary | ICD-10-CM | POA: Diagnosis not present

## 2014-03-19 DIAGNOSIS — M5441 Lumbago with sciatica, right side: Secondary | ICD-10-CM | POA: Diagnosis not present

## 2014-03-19 DIAGNOSIS — M6249 Contracture of muscle, multiple sites: Secondary | ICD-10-CM | POA: Diagnosis not present

## 2014-03-19 DIAGNOSIS — M6281 Muscle weakness (generalized): Secondary | ICD-10-CM | POA: Diagnosis not present

## 2014-03-19 DIAGNOSIS — M2560 Stiffness of unspecified joint, not elsewhere classified: Secondary | ICD-10-CM | POA: Diagnosis not present

## 2014-03-20 ENCOUNTER — Encounter: Payer: Self-pay | Admitting: Family Medicine

## 2014-03-20 ENCOUNTER — Ambulatory Visit (INDEPENDENT_AMBULATORY_CARE_PROVIDER_SITE_OTHER): Payer: Medicare Other | Admitting: Family Medicine

## 2014-03-20 VITALS — BP 110/62 | HR 84 | Temp 98.0°F | Resp 18 | Wt 230.0 lb

## 2014-03-20 DIAGNOSIS — M5431 Sciatica, right side: Secondary | ICD-10-CM

## 2014-03-20 DIAGNOSIS — I10 Essential (primary) hypertension: Secondary | ICD-10-CM | POA: Diagnosis not present

## 2014-03-20 DIAGNOSIS — E785 Hyperlipidemia, unspecified: Secondary | ICD-10-CM

## 2014-03-20 MED ORDER — VALSARTAN 160 MG PO TABS: 160.0000 mg | ORAL_TABLET | Freq: Every day | ORAL | Status: DC

## 2014-03-20 MED ORDER — GABAPENTIN 300 MG PO CAPS: 300.0000 mg | ORAL_CAPSULE | Freq: Three times a day (TID) | ORAL | Status: DC

## 2014-03-20 NOTE — Progress Notes (Signed)
Subjective:    Patient ID: Henry Martin, male    DOB: Dec 18, 1940, 74 y.o.   MRN: [id number]  HPI Patient continues to have right-sided sciatica particularly when he sits for prolonged periods of time. Orthopedics is recommended a trial of physical therapy. Patient would like a medication he can take on an as-needed basis to help dull the nerve pain. He is also questioning whether he still needs the per DACs. He has a history of atrial fibrillation which was transient and associated with a severe gastrointestinal illness. I have not found the patient to be in atrial fibrillation in any of our encounter since. He was previously on Aggrenox for secondary prevention of stroke prior to that. His cardiologist is Dr. Stanford Breed. He denies any chest pain shortness of breath or dyspnea on exertion. Past Medical History  Diagnosis Date  . HTN (hypertension)   . HLD (hyperlipidemia)   . Renal cell cancer, right 2000    s/p partial neprhectomy   . CAD (coronary artery disease)     mild nonobstructive by cath '05 (ASTEROID trial - mLAD 30%, CFX 20-30%, OM 20%, mRCA 30%, normal LVF), EF 65% by echo '12;    . Atrial fibrillation with rapid ventricular response 07/04/11    Xarelto started 4/13;    . Complication of anesthesia     "bowels fall asleep & he can't have BM"  . OSA (obstructive sleep apnea)     "suppose to be wearing my mask"  . GERD (gastroesophageal reflux disease) 07/04/11    "once in awhile; haven't been treated for it"  . Stroke 2007    denies residual  . Renal cell carcinoma   . Skin cancer 06/2011    left ear   Past Surgical History  Procedure Laterality Date  . Thyroidectomy, partial  ~ 2007  . Partial nephrectomy Right 2000    "took off  25%"  . Total knee arthroplasty Left 2010  . Tonsillectomy and adenoidectomy      "as a child"  . Knee arthroscopy Left 1990's  . Cardiac catheterization  ~ 2007   Current Outpatient Prescriptions on File Prior to Visit  Medication Sig  Dispense Refill  . atorvastatin (LIPITOR) 20 MG tablet TAKE 1 TABLET BY MOUTH ONCE A DAY 90 tablet 0  . BENICAR HCT 40-25 MG per tablet TAKE 1 TABLET BY MOUTH ONCE A DAY 90 tablet 0  . Cholecalciferol (VITAMIN D3) 1000 UNITS CAPS Take 2 capsules by mouth daily.     . Fish Oil OIL Take 800 mg by mouth daily.    . fluticasone (FLONASE) 50 MCG/ACT nasal spray PLACE 2 SPRAYS INTO THE NOSE EVERY EVENING. 16 g 3  . metoprolol succinate (TOPROL-XL) 25 MG 24 hr tablet TAKE 1 TABLET BY MOUTH ONCE A DAY 90 tablet 3  . tadalafil (CIALIS) 20 MG tablet Take 20 mg by mouth daily as needed for erectile dysfunction.    [name] 20 MG TABS tablet TAKE 1 TABLET BY MOUTH ONCE A DAY 90 tablet 1   No current facility-administered medications on file prior to visit.   Allergies  Allergen Reactions  . Contrast Media [Iodinated Diagnostic Agents] Other (See Comments)    "Isovue"; "blisters; all down my legs"   History   Social History  . Marital Status: Married    Spouse Name: N/A    Number of Children: N/A  . Years of Education: N/A   Occupational History  . Not on file.   Social  History Main Topics  . Smoking status: Former Smoker -- 1.00 packs/day for 54 years    Types: Cigarettes    Quit date: 04/04/1977  . Smokeless tobacco: Former Systems developer    Types: Chew    Quit date: 10/04/2005  . Alcohol Use: Yes     Comment: rare  . Drug Use: No  . Sexual Activity: Yes     Comment: married happily to [place]   Other Topics Concern  . Not on file   Social History Narrative   Retired. Lives in [place]   Family History  Problem Relation Age of Onset  . Heart failure Mother     died at 87  . Diabetes Mother   . Colon cancer Neg Hx   . Prostate cancer Neg Hx   . Kidney cancer Father       Review of Systems  All other systems reviewed and are negative.      Objective:   Physical Exam  Cardiovascular: Normal rate, regular rhythm, normal heart sounds and intact distal pulses.  Exam reveals  no gallop and no friction rub.   No murmur heard. Pulmonary/Chest: Effort normal and breath sounds normal. No respiratory distress. He has no wheezes. He has no rales. He exhibits no tenderness.  Abdominal: Soft. Bowel sounds are normal. He exhibits no distension. There is no tenderness. There is no rebound and no guarding.  Musculoskeletal: He exhibits no edema.  Vitals reviewed.         Assessment & Plan:  Benign essential HTN  HLD (hyperlipidemia)  Sciatica associated with disorder of lumbar spine, right - Plan: gabapentin (NEURONTIN) 300 MG capsule  Patient's blood pressures well controlled. In fact the patient appears a little dehydrated. Therefore I want him to discontinue Benicar HCTZ and replace it with valsartan 160 mg poqday.  I believe the patient could also likely discontinue Xarelto. I have recommended he discuss this with his cardiologist. I do believe this was a transient isolated phenomenon caused by his severe gastrointestinal illness. If he does discontinue Xarelto he would need to resume Aggrenox. I have recommended the patient discuss this with his cardiologist at his next visit. I like the patient to return fasting for a CMP and a fasting lipid panel at his earliest convenience. I also gave the patient gabapentin 300 mg to be used every 8 hours as needed for sciatica.

## 2014-03-23 DIAGNOSIS — M6281 Muscle weakness (generalized): Secondary | ICD-10-CM | POA: Diagnosis not present

## 2014-03-23 DIAGNOSIS — M6249 Contracture of muscle, multiple sites: Secondary | ICD-10-CM | POA: Diagnosis not present

## 2014-03-23 DIAGNOSIS — M2560 Stiffness of unspecified joint, not elsewhere classified: Secondary | ICD-10-CM | POA: Diagnosis not present

## 2014-03-23 DIAGNOSIS — M5441 Lumbago with sciatica, right side: Secondary | ICD-10-CM | POA: Diagnosis not present

## 2014-03-25 DIAGNOSIS — M5441 Lumbago with sciatica, right side: Secondary | ICD-10-CM | POA: Diagnosis not present

## 2014-03-25 DIAGNOSIS — M2560 Stiffness of unspecified joint, not elsewhere classified: Secondary | ICD-10-CM | POA: Diagnosis not present

## 2014-03-25 DIAGNOSIS — M6281 Muscle weakness (generalized): Secondary | ICD-10-CM | POA: Diagnosis not present

## 2014-03-25 DIAGNOSIS — M6249 Contracture of muscle, multiple sites: Secondary | ICD-10-CM | POA: Diagnosis not present

## 2014-03-30 DIAGNOSIS — M6249 Contracture of muscle, multiple sites: Secondary | ICD-10-CM | POA: Diagnosis not present

## 2014-03-30 DIAGNOSIS — M6281 Muscle weakness (generalized): Secondary | ICD-10-CM | POA: Diagnosis not present

## 2014-03-30 DIAGNOSIS — M5441 Lumbago with sciatica, right side: Secondary | ICD-10-CM | POA: Diagnosis not present

## 2014-03-30 DIAGNOSIS — M2560 Stiffness of unspecified joint, not elsewhere classified: Secondary | ICD-10-CM | POA: Diagnosis not present

## 2014-04-01 ENCOUNTER — Other Ambulatory Visit: Payer: Medicare Other

## 2014-04-01 DIAGNOSIS — M5441 Lumbago with sciatica, right side: Secondary | ICD-10-CM | POA: Diagnosis not present

## 2014-04-01 DIAGNOSIS — M6281 Muscle weakness (generalized): Secondary | ICD-10-CM | POA: Diagnosis not present

## 2014-04-01 DIAGNOSIS — M2560 Stiffness of unspecified joint, not elsewhere classified: Secondary | ICD-10-CM | POA: Diagnosis not present

## 2014-04-01 DIAGNOSIS — E785 Hyperlipidemia, unspecified: Secondary | ICD-10-CM

## 2014-04-01 DIAGNOSIS — Z79899 Other long term (current) drug therapy: Secondary | ICD-10-CM | POA: Diagnosis not present

## 2014-04-01 DIAGNOSIS — M6249 Contracture of muscle, multiple sites: Secondary | ICD-10-CM | POA: Diagnosis not present

## 2014-04-01 LAB — COMPLETE METABOLIC PANEL WITH GFR
ALK PHOS: 41 U/L (ref 39–117)
ALT: 20 U/L (ref 0–53)
AST: 18 U/L (ref 0–37)
Albumin: 3.7 g/dL (ref 3.5–5.2)
BUN: 14 mg/dL (ref 6–23)
CHLORIDE: 106 meq/L (ref 96–112)
CO2: 28 mEq/L (ref 19–32)
CREATININE: 1.08 mg/dL (ref 0.50–1.35)
Calcium: 9.4 mg/dL (ref 8.4–10.5)
GFR, EST AFRICAN AMERICAN: 79 mL/min
GFR, EST NON AFRICAN AMERICAN: 68 mL/min
GLUCOSE: 101 mg/dL — AB (ref 70–99)
POTASSIUM: 5.1 meq/L (ref 3.5–5.3)
Sodium: 141 mEq/L (ref 135–145)
Total Bilirubin: 0.6 mg/dL (ref 0.2–1.2)
Total Protein: 6.5 g/dL (ref 6.0–8.3)

## 2014-04-01 LAB — LIPID PANEL
CHOL/HDL RATIO: 2.5 ratio
Cholesterol: 113 mg/dL (ref 0–200)
HDL: 45 mg/dL (ref >39)
LDL Cholesterol: 60 mg/dL (ref 0–99)
TRIGLYCERIDES: 42 mg/dL (ref <150)
VLDL: 8 mg/dL (ref 0–40)

## 2014-04-02 ENCOUNTER — Encounter: Payer: Self-pay | Admitting: *Deleted

## 2014-04-02 DIAGNOSIS — M5441 Lumbago with sciatica, right side: Secondary | ICD-10-CM | POA: Diagnosis not present

## 2014-04-02 DIAGNOSIS — M2560 Stiffness of unspecified joint, not elsewhere classified: Secondary | ICD-10-CM | POA: Diagnosis not present

## 2014-04-02 DIAGNOSIS — M6249 Contracture of muscle, multiple sites: Secondary | ICD-10-CM | POA: Diagnosis not present

## 2014-04-02 DIAGNOSIS — M6281 Muscle weakness (generalized): Secondary | ICD-10-CM | POA: Diagnosis not present

## 2014-04-06 DIAGNOSIS — M6249 Contracture of muscle, multiple sites: Secondary | ICD-10-CM | POA: Diagnosis not present

## 2014-04-06 DIAGNOSIS — M5441 Lumbago with sciatica, right side: Secondary | ICD-10-CM | POA: Diagnosis not present

## 2014-04-06 DIAGNOSIS — M6281 Muscle weakness (generalized): Secondary | ICD-10-CM | POA: Diagnosis not present

## 2014-04-06 DIAGNOSIS — M2560 Stiffness of unspecified joint, not elsewhere classified: Secondary | ICD-10-CM | POA: Diagnosis not present

## 2014-04-08 DIAGNOSIS — M6249 Contracture of muscle, multiple sites: Secondary | ICD-10-CM | POA: Diagnosis not present

## 2014-04-08 DIAGNOSIS — M6281 Muscle weakness (generalized): Secondary | ICD-10-CM | POA: Diagnosis not present

## 2014-04-08 DIAGNOSIS — M5441 Lumbago with sciatica, right side: Secondary | ICD-10-CM | POA: Diagnosis not present

## 2014-04-08 DIAGNOSIS — M2560 Stiffness of unspecified joint, not elsewhere classified: Secondary | ICD-10-CM | POA: Diagnosis not present

## 2014-04-10 DIAGNOSIS — M2560 Stiffness of unspecified joint, not elsewhere classified: Secondary | ICD-10-CM | POA: Diagnosis not present

## 2014-04-10 DIAGNOSIS — M5441 Lumbago with sciatica, right side: Secondary | ICD-10-CM | POA: Diagnosis not present

## 2014-04-10 DIAGNOSIS — M6281 Muscle weakness (generalized): Secondary | ICD-10-CM | POA: Diagnosis not present

## 2014-04-10 DIAGNOSIS — M6249 Contracture of muscle, multiple sites: Secondary | ICD-10-CM | POA: Diagnosis not present

## 2014-04-18 ENCOUNTER — Other Ambulatory Visit: Payer: Self-pay | Admitting: Family Medicine

## 2014-05-21 ENCOUNTER — Other Ambulatory Visit: Payer: Self-pay | Admitting: Family Medicine

## 2014-06-22 ENCOUNTER — Other Ambulatory Visit: Payer: Self-pay | Admitting: Family Medicine

## 2014-06-22 NOTE — Telephone Encounter (Signed)
Medication refilled per protocol. 

## 2014-09-01 ENCOUNTER — Other Ambulatory Visit: Payer: Medicare Other

## 2014-09-01 ENCOUNTER — Other Ambulatory Visit: Payer: Self-pay | Admitting: Family Medicine

## 2014-09-01 DIAGNOSIS — Z79899 Other long term (current) drug therapy: Secondary | ICD-10-CM | POA: Diagnosis not present

## 2014-09-01 DIAGNOSIS — E785 Hyperlipidemia, unspecified: Secondary | ICD-10-CM | POA: Diagnosis not present

## 2014-09-01 DIAGNOSIS — Z0000 Encounter for general adult medical examination without abnormal findings: Secondary | ICD-10-CM

## 2014-09-01 DIAGNOSIS — I10 Essential (primary) hypertension: Secondary | ICD-10-CM | POA: Diagnosis not present

## 2014-09-01 DIAGNOSIS — Z125 Encounter for screening for malignant neoplasm of prostate: Secondary | ICD-10-CM

## 2014-09-01 LAB — LIPID PANEL
CHOL/HDL RATIO: 2.5 ratio
Cholesterol: 113 mg/dL (ref 0–200)
HDL: 45 mg/dL (ref >=40)
LDL CALC: 57 mg/dL (ref 0–99)
Triglycerides: 55 mg/dL (ref <150)
VLDL: 11 mg/dL (ref 0–40)

## 2014-09-01 LAB — COMPLETE METABOLIC PANEL WITH GFR
ALT: 18 U/L (ref 0–53)
AST: 17 U/L (ref 0–37)
Albumin: 3.6 g/dL (ref 3.5–5.2)
Alkaline Phosphatase: 40 U/L (ref 39–117)
BILIRUBIN TOTAL: 0.6 mg/dL (ref 0.2–1.2)
BUN: 16 mg/dL (ref 6–23)
CO2: 27 meq/L (ref 19–32)
Calcium: 9 mg/dL (ref 8.4–10.5)
Chloride: 106 mEq/L (ref 96–112)
Creat: 1.09 mg/dL (ref 0.50–1.35)
GFR, Est African American: 77 mL/min
GFR, Est Non African American: 67 mL/min
GLUCOSE: 103 mg/dL — AB (ref 70–99)
Potassium: 4 mEq/L (ref 3.5–5.3)
Sodium: 142 mEq/L (ref 135–145)
TOTAL PROTEIN: 6.1 g/dL (ref 6.0–8.3)

## 2014-09-01 LAB — CBC WITH DIFFERENTIAL/PLATELET
Basophils Absolute: 0.1 10*3/uL (ref 0.0–0.1)
Basophils Relative: 1 % (ref 0–1)
EOS ABS: 0.2 10*3/uL (ref 0.0–0.7)
EOS PCT: 3 % (ref 0–5)
HCT: 42 % (ref 39.0–52.0)
Hemoglobin: 13.9 g/dL (ref 13.0–17.0)
LYMPHS ABS: 2.5 10*3/uL (ref 0.7–4.0)
Lymphocytes Relative: 35 % (ref 12–46)
MCH: 29.1 pg (ref 26.0–34.0)
MCHC: 33.1 g/dL (ref 30.0–36.0)
MCV: 87.9 fL (ref 78.0–100.0)
MPV: 11.3 fL (ref 8.6–12.4)
Monocytes Absolute: 0.9 10*3/uL (ref 0.1–1.0)
Monocytes Relative: 12 % (ref 3–12)
NEUTROS PCT: 49 % (ref 43–77)
Neutro Abs: 3.5 10*3/uL (ref 1.7–7.7)
Platelets: 202 10*3/uL (ref 150–400)
RBC: 4.78 MIL/uL (ref 4.22–5.81)
RDW: 13.7 % (ref 11.5–15.5)
WBC: 7.1 10*3/uL (ref 4.0–10.5)

## 2014-09-01 LAB — TSH: TSH: 3.507 u[IU]/mL (ref 0.350–4.500)

## 2014-09-02 LAB — PSA, MEDICARE: PSA: 3.33 ng/mL (ref <=4.00)

## 2014-09-14 ENCOUNTER — Encounter: Payer: Self-pay | Admitting: Family Medicine

## 2014-09-14 ENCOUNTER — Ambulatory Visit (INDEPENDENT_AMBULATORY_CARE_PROVIDER_SITE_OTHER): Payer: Medicare Other | Admitting: Family Medicine

## 2014-09-14 VITALS — BP 110/60 | HR 78 | Temp 97.8°F | Resp 16 | Ht 72.0 in | Wt 232.0 lb

## 2014-09-14 DIAGNOSIS — Z0000 Encounter for general adult medical examination without abnormal findings: Secondary | ICD-10-CM

## 2014-09-14 NOTE — Progress Notes (Signed)
Subjective:    Patient ID: Henry Martin, male    DOB: [birth year], 74 y.o.   MRN: [id number]  HPI Patient is here today for complete physical exam. Pneumovax 23, Prevnar 13, shingles vaccine, and his tetanus shot are all up-to-date. His prostate is checked every year by his urologist. This is pending for August. His colonoscopy was in 2014. They did find a tubular adenoma. His next colonoscopy is due in 2019. Otherwise his preventative care is up-to-date. His most recent lab work as listed below Appointment on 09/01/2014  Component Date Value Ref Range Status  . PSA 09/01/2014 3.33  <=4.00 ng/mL Final   Comment: Test Methodology: ECLIA PSA (Electrochemiluminescence Immunoassay)   For PSA values from 2.5-4.0, particularly in younger men <26 years old, the AUA and NCCN suggest testing for % Free PSA (3515) and evaluation of the rate of increase in PSA (PSA velocity).   . Sodium 09/01/2014 142  135 - 145 mEq/L Final  . Potassium 09/01/2014 4.0  3.5 - 5.3 mEq/L Final  . Chloride 09/01/2014 106  96 - 112 mEq/L Final  . CO2 09/01/2014 27  19 - 32 mEq/L Final  . Glucose, Bld 09/01/2014 103* 70 - 99 mg/dL Final  . BUN 09/01/2014 16  6 - 23 mg/dL Final  . Creat 09/01/2014 1.09  0.50 - 1.35 mg/dL Final  . Total Bilirubin 09/01/2014 0.6  0.2 - 1.2 mg/dL Final  . Alkaline Phosphatase 09/01/2014 40  39 - 117 U/L Final  . AST 09/01/2014 17  0 - 37 U/L Final  . ALT 09/01/2014 18  0 - 53 U/L Final  . Total Protein 09/01/2014 6.1  6.0 - 8.3 g/dL Final  . Albumin 09/01/2014 3.6  3.5 - 5.2 g/dL Final  . Calcium 09/01/2014 9.0  8.4 - 10.5 mg/dL Final  . GFR, Est African American 09/01/2014 77   Final  . GFR, Est Non African American 09/01/2014 67   Final   Comment:   The estimated GFR is a calculation valid for adults (>=48 years old) that uses the CKD-EPI algorithm to adjust for age and sex. It is   not to be used for children, pregnant women, hospitalized patients,    patients on dialysis, or  with rapidly changing kidney function. According to the NKDEP, eGFR >89 is normal, 60-89 shows mild impairment, 30-59 shows moderate impairment, 15-29 shows severe impairment and <15 is ESRD.     . TSH 09/01/2014 3.507  0.350 - 4.500 uIU/mL Final  . Cholesterol 09/01/2014 113  0 - 200 mg/dL Final   Comment: ATP III Classification:       < 200        mg/dL        Desirable      200 - 239     mg/dL        Borderline High      >= 240        mg/dL        High     . Triglycerides 09/01/2014 55  <150 mg/dL Final  . HDL 09/01/2014 45  >=40 mg/dL Final  . Total CHOL/HDL Ratio 09/01/2014 2.5   Final  . VLDL 09/01/2014 11  0 - 40 mg/dL Final  . LDL Cholesterol 09/01/2014 57  0 - 99 mg/dL Final   Comment:   Total Cholesterol/HDL Ratio:CHD Risk  Coronary Heart Disease Risk Table                                        Men       Women          1/2 Average Risk              3.4        3.3              Average Risk              5.0        4.4           2X Average Risk              9.6        7.1           3X Average Risk             23.4       11.0 Use the calculated Patient Ratio above and the CHD Risk table  to determine the patient's CHD Risk. ATP III Classification (LDL):       < 100        mg/dL         Optimal      100 - 129     mg/dL         Near or Above Optimal      130 - 159     mg/dL         Borderline High      160 - 189     mg/dL         High       > 190        mg/dL         Very High     . WBC 09/01/2014 7.1  4.0 - 10.5 K/uL Final  . RBC 09/01/2014 4.78  4.22 - 5.81 MIL/uL Final  . Hemoglobin 09/01/2014 13.9  13.0 - 17.0 g/dL Final  . HCT 09/01/2014 42.0  39.0 - 52.0 % Final  . MCV 09/01/2014 87.9  78.0 - 100.0 fL Final  . MCH 09/01/2014 29.1  26.0 - 34.0 pg Final  . MCHC 09/01/2014 33.1  30.0 - 36.0 g/dL Final  . RDW 09/01/2014 13.7  11.5 - 15.5 % Final  . Platelets 09/01/2014 202  150 - 400 K/uL Final  . MPV 09/01/2014 11.3  8.6 - 12.4 fL Final  .  Neutrophils Relative % 09/01/2014 49  43 - 77 % Final  . Neutro Abs 09/01/2014 3.5  1.7 - 7.7 K/uL Final  . Lymphocytes Relative 09/01/2014 35  12 - 46 % Final  . Lymphs Abs 09/01/2014 2.5  0.7 - 4.0 K/uL Final  . Monocytes Relative 09/01/2014 12  3 - 12 % Final  . Monocytes Absolute 09/01/2014 0.9  0.1 - 1.0 K/uL Final  . Eosinophils Relative 09/01/2014 3  0 - 5 % Final  . Eosinophils Absolute 09/01/2014 0.2  0.0 - 0.7 K/uL Final  . Basophils Relative 09/01/2014 1  0 - 1 % Final  . Basophils Absolute 09/01/2014 0.1  0.0 - 0.1 K/uL Final  . Smear Review 09/01/2014 Criteria for review not met   Final    Past Medical History  Diagnosis Date  . HTN (hypertension)   . HLD (hyperlipidemia)   .  Renal cell cancer, right 2000    s/p partial neprhectomy   . CAD (coronary artery disease)     mild nonobstructive by cath '05 (ASTEROID trial - mLAD 30%, CFX 20-30%, OM 20%, mRCA 30%, normal LVF), EF 65% by echo '12;    . Atrial fibrillation with rapid ventricular response 07/04/11    Xarelto started 4/13;    . Complication of anesthesia     "bowels fall asleep & he can't have BM"  . OSA (obstructive sleep apnea)     "suppose to be wearing my mask"  . GERD (gastroesophageal reflux disease) 07/04/11    "once in awhile; haven't been treated for it"  . Stroke 2007    denies residual  . Renal cell carcinoma   . Skin cancer 06/2011    left ear   Past Surgical History  Procedure Laterality Date  . Thyroidectomy, partial  ~ 2007  . Partial nephrectomy Right 2000    "took off  25%"  . Total knee arthroplasty Left 2010  . Tonsillectomy and adenoidectomy      "as a child"  . Knee arthroscopy Left 1990's  . Cardiac catheterization  ~ 2007   Current Outpatient Prescriptions on File Prior to Visit  Medication Sig Dispense Refill  . atorvastatin (LIPITOR) 20 MG tablet TAKE 1 TABLET BY MOUTH ONCE A DAY 90 tablet 1  . BENICAR HCT 40-25 MG per tablet TAKE 1 TABLET BY MOUTH ONCE A DAY 90 tablet 0  .  Cholecalciferol (VITAMIN D3) 1000 UNITS CAPS Take 2 capsules by mouth daily.     . Fish Oil OIL Take 800 mg by mouth daily.    . fluticasone (FLONASE) 50 MCG/ACT nasal spray PLACE 2 SPRAYS INTO THE NOSE EVERY EVENING. 16 g 3  . gabapentin (NEURONTIN) 300 MG capsule Take 1 capsule (300 mg total) by mouth 3 (three) times daily. 90 capsule 3  . metoprolol succinate (TOPROL-XL) 25 MG 24 hr tablet TAKE 1 TABLET BY MOUTH ONCE A DAY 90 tablet 3  . tadalafil (CIALIS) 20 MG tablet Take 20 mg by mouth daily as needed for erectile dysfunction.    . valsartan (DIOVAN) 160 MG tablet Take 1 tablet (160 mg total) by mouth daily. 30 tablet 11  . XARELTO 20 MG TABS tablet TAKE ONE TABLET BY MOUTH EVERY DAY 90 tablet 3   No current facility-administered medications on file prior to visit.   Allergies  Allergen Reactions  . Contrast Media [Iodinated Diagnostic Agents] Other (See Comments)    "Isovue"; "blisters; all down my legs"   History   Social History  . Marital Status: Married    Spouse Name: N/A  . Number of Children: N/A  . Years of Education: N/A   Occupational History  . Not on file.   Social History Main Topics  . Smoking status: Former Smoker -- 1.00 packs/day for 54 years    Types: Cigarettes    Quit date: 04/04/1977  . Smokeless tobacco: Former Systems developer    Types: Chew    Quit date: 10/04/2005  . Alcohol Use: Yes     Comment: rare  . Drug Use: No  . Sexual Activity: Yes     Comment: married happily to [place]   Other Topics Concern  . Not on file   Social History Narrative   Retired. Lives in [place]   Family History  Problem Relation Age of Onset  . Heart failure Mother     died at 73  .  Diabetes Mother   . Colon cancer Neg Hx   . Prostate cancer Neg Hx   . Kidney cancer Father       Review of Systems  All other systems reviewed and are negative.      Objective:   Physical Exam  Constitutional: He is oriented to person, place, and time. He appears  well-developed and well-nourished. No distress.  HENT:  Head: Normocephalic and atraumatic.  Right Ear: External ear normal.  Left Ear: External ear normal.  Nose: Nose normal.  Mouth/Throat: Oropharynx is clear and moist. No oropharyngeal exudate.  Eyes: Conjunctivae and EOM are normal. Pupils are equal, round, and reactive to light. Right eye exhibits no discharge. Left eye exhibits no discharge. No scleral icterus.  Neck: Normal range of motion. Neck supple. No JVD present. No tracheal deviation present. No thyromegaly present.  Cardiovascular: Normal rate, regular rhythm, normal heart sounds and intact distal pulses.  Exam reveals no gallop and no friction rub.   No murmur heard. Pulmonary/Chest: Effort normal and breath sounds normal. No stridor. No respiratory distress. He has no wheezes. He has no rales. He exhibits no tenderness.  Abdominal: Soft. Bowel sounds are normal. He exhibits no distension and no mass. There is no tenderness. There is no rebound and no guarding.  Musculoskeletal: Normal range of motion. He exhibits no edema or tenderness.  Lymphadenopathy:    He has no cervical adenopathy.  Neurological: He is alert and oriented to person, place, and time. He has normal reflexes. He displays normal reflexes. No cranial nerve deficit. He exhibits normal muscle tone. Coordination normal.  Skin: Skin is warm. No rash noted. He is not diaphoretic. No erythema. No pallor.  Psychiatric: He has a normal mood and affect. His behavior is normal. Judgment and thought content normal.  Vitals reviewed.         Assessment & Plan:  Routine general medical examination at a health care facility  Physical exam is completely normal. Immunizations are up-to-date. Cancer screening is up-to-date. Prostate will be checked by his urologist. Lab work is excellent except for mildly elevated blood sugar. I recommended diet exercise and weight loss. Otherwise no change in therapy at this time

## 2014-09-24 ENCOUNTER — Other Ambulatory Visit: Payer: Self-pay | Admitting: Family Medicine

## 2014-09-24 NOTE — Telephone Encounter (Signed)
Medication refilled per protocol. 

## 2014-11-12 DIAGNOSIS — H2513 Age-related nuclear cataract, bilateral: Secondary | ICD-10-CM | POA: Diagnosis not present

## 2014-11-17 ENCOUNTER — Other Ambulatory Visit (HOSPITAL_COMMUNITY): Payer: Self-pay | Admitting: Urology

## 2014-11-17 ENCOUNTER — Ambulatory Visit (HOSPITAL_COMMUNITY)
Admission: RE | Admit: 2014-11-17 | Discharge: 2014-11-17 | Disposition: A | Discharge status: Home / Self Care | Payer: Medicare Other | Source: Ambulatory Visit | Attending: Urology | Admitting: Urology

## 2014-11-17 DIAGNOSIS — Z125 Encounter for screening for malignant neoplasm of prostate: Secondary | ICD-10-CM

## 2014-11-17 DIAGNOSIS — R198 Other specified symptoms and signs involving the digestive system and abdomen: Secondary | ICD-10-CM | POA: Diagnosis not present

## 2014-11-17 DIAGNOSIS — C61 Malignant neoplasm of prostate: Secondary | ICD-10-CM | POA: Insufficient documentation

## 2014-11-17 DIAGNOSIS — N401 Enlarged prostate with lower urinary tract symptoms: Secondary | ICD-10-CM | POA: Diagnosis not present

## 2014-11-17 DIAGNOSIS — Z85528 Personal history of other malignant neoplasm of kidney: Secondary | ICD-10-CM | POA: Diagnosis not present

## 2014-11-19 DIAGNOSIS — D1801 Hemangioma of skin and subcutaneous tissue: Secondary | ICD-10-CM | POA: Diagnosis not present

## 2014-11-19 DIAGNOSIS — L570 Actinic keratosis: Secondary | ICD-10-CM | POA: Diagnosis not present

## 2014-11-19 DIAGNOSIS — L821 Other seborrheic keratosis: Secondary | ICD-10-CM | POA: Diagnosis not present

## 2014-11-19 DIAGNOSIS — Z85828 Personal history of other malignant neoplasm of skin: Secondary | ICD-10-CM | POA: Diagnosis not present

## 2014-11-19 DIAGNOSIS — D220 Melanocytic nevi of lip: Secondary | ICD-10-CM | POA: Diagnosis not present

## 2014-11-24 DIAGNOSIS — N5201 Erectile dysfunction due to arterial insufficiency: Secondary | ICD-10-CM | POA: Diagnosis not present

## 2014-11-24 DIAGNOSIS — Z85528 Personal history of other malignant neoplasm of kidney: Secondary | ICD-10-CM | POA: Diagnosis not present

## 2014-12-28 ENCOUNTER — Other Ambulatory Visit: Payer: Self-pay | Admitting: Family Medicine

## 2015-01-12 ENCOUNTER — Ambulatory Visit (INDEPENDENT_AMBULATORY_CARE_PROVIDER_SITE_OTHER): Payer: Medicare Other | Admitting: Family Medicine

## 2015-01-12 DIAGNOSIS — Z23 Encounter for immunization: Secondary | ICD-10-CM | POA: Diagnosis not present

## 2015-01-27 ENCOUNTER — Encounter: Payer: Self-pay | Admitting: Family Medicine

## 2015-02-22 ENCOUNTER — Other Ambulatory Visit: Payer: Self-pay | Admitting: Family Medicine

## 2015-04-19 ENCOUNTER — Other Ambulatory Visit: Payer: Self-pay | Admitting: Family Medicine

## 2015-04-20 NOTE — Telephone Encounter (Signed)
Refill appropriate and filled per protocol. 

## 2015-04-30 DIAGNOSIS — E785 Hyperlipidemia, unspecified: Secondary | ICD-10-CM | POA: Diagnosis not present

## 2015-04-30 DIAGNOSIS — Z8701 Personal history of pneumonia (recurrent): Secondary | ICD-10-CM | POA: Diagnosis not present

## 2015-04-30 DIAGNOSIS — Z87891 Personal history of nicotine dependence: Secondary | ICD-10-CM | POA: Diagnosis not present

## 2015-04-30 DIAGNOSIS — Z7901 Long term (current) use of anticoagulants: Secondary | ICD-10-CM | POA: Diagnosis not present

## 2015-04-30 DIAGNOSIS — R05 Cough: Secondary | ICD-10-CM | POA: Diagnosis not present

## 2015-04-30 DIAGNOSIS — Z8673 Personal history of transient ischemic attack (TIA), and cerebral infarction without residual deficits: Secondary | ICD-10-CM | POA: Diagnosis not present

## 2015-04-30 DIAGNOSIS — R509 Fever, unspecified: Secondary | ICD-10-CM | POA: Diagnosis not present

## 2015-04-30 DIAGNOSIS — H9209 Otalgia, unspecified ear: Secondary | ICD-10-CM | POA: Diagnosis not present

## 2015-04-30 DIAGNOSIS — I10 Essential (primary) hypertension: Secondary | ICD-10-CM | POA: Diagnosis not present

## 2015-04-30 DIAGNOSIS — R0981 Nasal congestion: Secondary | ICD-10-CM | POA: Diagnosis not present

## 2015-04-30 DIAGNOSIS — J3489 Other specified disorders of nose and nasal sinuses: Secondary | ICD-10-CM | POA: Diagnosis not present

## 2015-04-30 DIAGNOSIS — J111 Influenza due to unidentified influenza virus with other respiratory manifestations: Secondary | ICD-10-CM | POA: Diagnosis not present

## 2015-04-30 DIAGNOSIS — M791 Myalgia: Secondary | ICD-10-CM | POA: Diagnosis not present

## 2015-04-30 DIAGNOSIS — I4891 Unspecified atrial fibrillation: Secondary | ICD-10-CM | POA: Diagnosis not present

## 2015-04-30 DIAGNOSIS — R51 Headache: Secondary | ICD-10-CM | POA: Diagnosis not present

## 2015-05-17 ENCOUNTER — Encounter: Payer: Self-pay | Admitting: Family Medicine

## 2015-05-17 ENCOUNTER — Ambulatory Visit (INDEPENDENT_AMBULATORY_CARE_PROVIDER_SITE_OTHER): Payer: Medicare Other | Admitting: Family Medicine

## 2015-05-17 VITALS — BP 130/78 | HR 68 | Temp 98.2°F | Resp 18 | Ht 72.0 in | Wt 245.0 lb

## 2015-05-17 DIAGNOSIS — M1000 Idiopathic gout, unspecified site: Secondary | ICD-10-CM

## 2015-05-17 LAB — URIC ACID: Uric Acid, Serum: 7.5 mg/dL (ref 4.0–7.8)

## 2015-05-17 MED ORDER — COLCHICINE 0.6 MG PO TABS: 0.6000 mg | ORAL_TABLET | Freq: Every day | ORAL | Status: DC

## 2015-05-17 NOTE — Progress Notes (Signed)
Subjective:    Patient ID: Henry Martin, male    DOB: Oct 20, 1940, 75 y.o.   MRN: [id number]  HPI Patient has a FH of gout in his father although he has never had a gout flare.  Recently has been drinking less water and urine has been getting a stronger odor.  Last week, ate a lot of shrimp.  Saturday, awoke with a lot of pain in his right wrist without injury.  Wrist is warm, swollen, and tender. Past Medical History  Diagnosis Date  . HTN (hypertension)   . HLD (hyperlipidemia)   . Renal cell cancer, right (Henry Martin) 2000    s/p partial neprhectomy   . CAD (coronary artery disease)     mild nonobstructive by cath '05 (ASTEROID trial - mLAD 30%, CFX 20-30%, OM 20%, mRCA 30%, normal LVF), EF 65% by echo '12;    . Atrial fibrillation with rapid ventricular response (Henry Martin) 07/04/11    Xarelto started 4/13;    . Complication of anesthesia     "bowels fall asleep & he can't have BM"  . OSA (obstructive sleep apnea)     "suppose to be wearing my mask"  . GERD (gastroesophageal reflux disease) 07/04/11    "once in awhile; haven't been treated for it"  . Stroke Henry Martin) 2007    denies residual  . Renal cell carcinoma   . Skin cancer 06/2011    left ear   Past Surgical History  Procedure Laterality Date  . Thyroidectomy, partial  ~ 2007  . Partial nephrectomy Right 2000    "took off  25%"  . Total knee arthroplasty Left 2010  . Tonsillectomy and adenoidectomy      "as a child"  . Knee arthroscopy Left 1990's  . Cardiac catheterization  ~ 2007   Current Outpatient Prescriptions on File Prior to Visit  Medication Sig Dispense Refill  . atorvastatin (LIPITOR) 20 MG tablet TAKE 1 TABLET BY MOUTH ONCE A DAY 90 tablet 3  . BENICAR HCT 40-25 MG tablet TAKE 1 TABLET BY MOUTH ONCE A DAY 90 tablet 0  . Cholecalciferol (VITAMIN D3) 1000 UNITS CAPS Take 2 capsules by mouth daily.     . Fish Oil OIL Take 800 mg by mouth daily.    . fluticasone (FLONASE) 50 MCG/ACT nasal spray PLACE 2 SPRAYS INTO THE  NOSE EVERY EVENING. 16 g 3  . gabapentin (NEURONTIN) 300 MG capsule Take 1 capsule (300 mg total) by mouth 3 (three) times daily. 90 capsule 3  . metoprolol succinate (TOPROL-XL) 25 MG 24 hr tablet TAKE ONE TABLET BY MOUTH ONCE A DAY 90 tablet 0  . tadalafil (CIALIS) 20 MG tablet Take 20 mg by mouth daily as needed for erectile dysfunction.    . valsartan (DIOVAN) 160 MG tablet Take 1 tablet (160 mg total) by mouth daily. 30 tablet 11  . XARELTO 20 MG TABS tablet TAKE 1 TABLET BY MOUTH ONCE A DAY 90 tablet 0   No current facility-administered medications on file prior to visit.   Allergies  Allergen Reactions  . Contrast Media [Iodinated Diagnostic Agents] Other (See Comments)    "Henry Martin"; "blisters; all down my legs"   Social History   Social History  . Marital Status: Married    Spouse Name: N/A  . Number of Children: N/A  . Years of Education: N/A   Occupational History  . Not on file.   Social History Main Topics  . Smoking status: Former Smoker -- 1.00 packs/day  for 54 years    Types: Cigarettes    Quit date: 04/04/1977  . Smokeless tobacco: Former Systems developer    Types: Chew    Quit date: 10/04/2005  . Alcohol Use: Yes     Comment: rare  . Drug Use: No  . Sexual Activity: Yes     Comment: married happily to Henry Martin   Other Topics Concern  . Not on file   Social History Narrative   Retired. Lives in [place]  All other systems reviewed and are negative.      Objective:   Physical Exam  Cardiovascular: Normal rate, regular rhythm and normal heart sounds.   Pulmonary/Chest: Effort normal and breath sounds normal.  Musculoskeletal: He exhibits edema.       Right wrist: He exhibits decreased range of motion, tenderness, bony tenderness and swelling.  Skin: Skin is warm. There is erythema.  Vitals reviewed.         Assessment & Plan:  Acute idiopathic gout, unspecified site - Plan: colchicine 0.6 MG tablet, Uric acid  I suspect  gout.  Check uric acid level. Meanwhile start colchicine 1.2 mg by mouth 1. Repeat 0.6 mg in 1 hour if pain persists. If gout is confirmed and pain persists, consider a steroid taper pack. Consider an x-ray of the wrist if uric acid level is normal and colchicine does not help with the pain.  I do not suspect septic arthritis based on his history and also on the appearance today

## 2015-05-18 ENCOUNTER — Ambulatory Visit
Admission: RE | Admit: 2015-05-18 | Discharge: 2015-05-18 | Disposition: A | Discharge status: Home / Self Care | Payer: Medicare Other | Source: Ambulatory Visit | Attending: Family Medicine | Admitting: Family Medicine

## 2015-05-18 ENCOUNTER — Other Ambulatory Visit: Payer: Self-pay | Admitting: Family Medicine

## 2015-05-18 DIAGNOSIS — M25531 Pain in right wrist: Secondary | ICD-10-CM | POA: Diagnosis not present

## 2015-05-18 DIAGNOSIS — M25431 Effusion, right wrist: Secondary | ICD-10-CM

## 2015-05-21 ENCOUNTER — Telehealth: Payer: Self-pay | Admitting: Family Medicine

## 2015-05-21 NOTE — Telephone Encounter (Signed)
Patient calling to get xray results from the 14th.  It is documented that you gave him results but he said he only got lab results and that he told you he couldn't take prednisone.  [phone]

## 2015-05-21 NOTE — Telephone Encounter (Signed)
Pt aware of results and still refuses prednisone at this point

## 2015-06-02 ENCOUNTER — Other Ambulatory Visit: Payer: Self-pay | Admitting: Family Medicine

## 2015-06-02 NOTE — Telephone Encounter (Signed)
Refill appropriate and filled per protocol. 

## 2015-06-15 ENCOUNTER — Other Ambulatory Visit: Payer: Self-pay | Admitting: Family Medicine

## 2015-06-15 NOTE — Telephone Encounter (Signed)
Refill appropriate and filled per protocol. 

## 2015-07-15 ENCOUNTER — Other Ambulatory Visit: Payer: Self-pay | Admitting: Family Medicine

## 2015-07-15 NOTE — Telephone Encounter (Signed)
Refill appropriate and filled per protocol. 

## 2015-08-18 ENCOUNTER — Other Ambulatory Visit: Payer: Self-pay | Admitting: Family Medicine

## 2015-08-18 NOTE — Telephone Encounter (Signed)
Refill appropriate and filled per protocol. 

## 2015-09-01 ENCOUNTER — Other Ambulatory Visit: Payer: Self-pay | Admitting: Family Medicine

## 2015-09-29 ENCOUNTER — Other Ambulatory Visit: Payer: Self-pay

## 2015-09-30 ENCOUNTER — Other Ambulatory Visit: Payer: Medicare Other

## 2015-09-30 DIAGNOSIS — I10 Essential (primary) hypertension: Secondary | ICD-10-CM | POA: Diagnosis not present

## 2015-09-30 DIAGNOSIS — Z125 Encounter for screening for malignant neoplasm of prostate: Secondary | ICD-10-CM | POA: Diagnosis not present

## 2015-09-30 DIAGNOSIS — Z0000 Encounter for general adult medical examination without abnormal findings: Secondary | ICD-10-CM | POA: Diagnosis not present

## 2015-09-30 DIAGNOSIS — E785 Hyperlipidemia, unspecified: Secondary | ICD-10-CM | POA: Diagnosis not present

## 2015-09-30 LAB — COMPLETE METABOLIC PANEL WITH GFR
ALBUMIN: 3.6 g/dL (ref 3.6–5.1)
ALK PHOS: 41 U/L (ref 40–115)
ALT: 14 U/L (ref 9–46)
AST: 17 U/L (ref 10–35)
BUN: 14 mg/dL (ref 7–25)
CALCIUM: 8.7 mg/dL (ref 8.6–10.3)
CO2: 24 mmol/L (ref 20–31)
CREATININE: 1.07 mg/dL (ref 0.70–1.18)
Chloride: 108 mmol/L (ref 98–110)
GFR, EST AFRICAN AMERICAN: 79 mL/min (ref >=60)
GFR, Est Non African American: 68 mL/min (ref >=60)
Glucose, Bld: 93 mg/dL (ref 70–99)
POTASSIUM: 4.9 mmol/L (ref 3.5–5.3)
Sodium: 141 mmol/L (ref 135–146)
Total Bilirubin: 0.7 mg/dL (ref 0.2–1.2)
Total Protein: 6.4 g/dL (ref 6.1–8.1)

## 2015-09-30 LAB — LIPID PANEL
CHOLESTEROL: 110 mg/dL — AB (ref 125–200)
HDL: 42 mg/dL (ref >=40)
LDL Cholesterol: 56 mg/dL (ref <130)
Total CHOL/HDL Ratio: 2.6 Ratio (ref <=5.0)
Triglycerides: 61 mg/dL (ref <150)
VLDL: 12 mg/dL (ref <30)

## 2015-09-30 LAB — CBC WITH DIFFERENTIAL/PLATELET
Basophils Absolute: 0 cells/uL (ref 0–200)
Basophils Relative: 0 %
EOS ABS: 150 {cells}/uL (ref 15–500)
Eosinophils Relative: 2 %
HEMATOCRIT: 41.8 % (ref 38.5–50.0)
HEMOGLOBIN: 13.7 g/dL (ref 13.0–17.0)
LYMPHS ABS: 2325 {cells}/uL (ref 850–3900)
LYMPHS PCT: 31 %
MCH: 28.9 pg (ref 27.0–33.0)
MCHC: 32.8 g/dL (ref 32.0–36.0)
MCV: 88.2 fL (ref 80.0–100.0)
MONO ABS: 825 {cells}/uL (ref 200–950)
MPV: 11.1 fL (ref 7.5–12.5)
Monocytes Relative: 11 %
NEUTROS PCT: 56 %
Neutro Abs: 4200 cells/uL (ref 1500–7800)
Platelets: 179 10*3/uL (ref 140–400)
RBC: 4.74 MIL/uL (ref 4.20–5.80)
RDW: 13.7 % (ref 11.0–15.0)
WBC: 7.5 10*3/uL (ref 3.8–10.8)

## 2015-10-01 LAB — PSA, MEDICARE: PSA: 2.92 ng/mL (ref <=4.00)

## 2015-10-05 ENCOUNTER — Ambulatory Visit (INDEPENDENT_AMBULATORY_CARE_PROVIDER_SITE_OTHER): Payer: Medicare Other | Admitting: Family Medicine

## 2015-10-05 ENCOUNTER — Encounter: Payer: Self-pay | Admitting: Family Medicine

## 2015-10-05 VITALS — BP 122/64 | HR 66 | Temp 97.6°F | Resp 16 | Ht 72.0 in | Wt 246.0 lb

## 2015-10-05 DIAGNOSIS — Z23 Encounter for immunization: Secondary | ICD-10-CM | POA: Diagnosis not present

## 2015-10-05 DIAGNOSIS — E785 Hyperlipidemia, unspecified: Secondary | ICD-10-CM | POA: Diagnosis not present

## 2015-10-05 DIAGNOSIS — M1000 Idiopathic gout, unspecified site: Secondary | ICD-10-CM

## 2015-10-05 DIAGNOSIS — Z0000 Encounter for general adult medical examination without abnormal findings: Secondary | ICD-10-CM

## 2015-10-05 DIAGNOSIS — I10 Essential (primary) hypertension: Secondary | ICD-10-CM

## 2015-10-05 NOTE — Progress Notes (Signed)
Subjective:    Patient ID: Henry Martin, male    DOB: 18-Sep-1940, 75 y.o.   MRN: [id number]  HPI Patient is here today for complete physical exam.  Prevnar 13, shingles vaccine, and his tetanus shot are all up-to-date. He is due for a prostate exam. His colonoscopy was in 2014. They did find a tubular adenoma. His next colonoscopy is due in 2019. Otherwise his preventative care is up-to-date. His most recent lab work as listed below Appointment on 09/30/2015  Component Date Value Ref Range Status  . WBC 09/30/2015 7.5  3.8 - 10.8 K/uL Final  . RBC 09/30/2015 4.74  4.20 - 5.80 MIL/uL Final  . Hemoglobin 09/30/2015 13.7  13.0 - 17.0 g/dL Final  . HCT 09/30/2015 41.8  38.5 - 50.0 % Final  . MCV 09/30/2015 88.2  80.0 - 100.0 fL Final  . MCH 09/30/2015 28.9  27.0 - 33.0 pg Final  . MCHC 09/30/2015 32.8  32.0 - 36.0 g/dL Final  . RDW 09/30/2015 13.7  11.0 - 15.0 % Final  . Platelets 09/30/2015 179  140 - 400 K/uL Final  . MPV 09/30/2015 11.1  7.5 - 12.5 fL Final  . Neutro Abs 09/30/2015 4200  1,500 - 7,800 cells/uL Final  . Lymphs Abs 09/30/2015 2325  850 - 3,900 cells/uL Final  . Monocytes Absolute 09/30/2015 825  200 - 950 cells/uL Final  . Eosinophils Absolute 09/30/2015 150  15 - 500 cells/uL Final  . Basophils Absolute 09/30/2015 0  0 - 200 cells/uL Final  . Neutrophils Relative % 09/30/2015 56  % Final  . Lymphocytes Relative 09/30/2015 31  % Final  . Monocytes Relative 09/30/2015 11  % Final  . Eosinophils Relative 09/30/2015 2  % Final  . Basophils Relative 09/30/2015 0  % Final  . Smear Review 09/30/2015 Criteria for review not met   Final  . Sodium 09/30/2015 141  135 - 146 mmol/L Final  . Potassium 09/30/2015 4.9  3.5 - 5.3 mmol/L Final  . Chloride 09/30/2015 108  98 - 110 mmol/L Final  . CO2 09/30/2015 24  20 - 31 mmol/L Final  . Glucose, Bld 09/30/2015 93  70 - 99 mg/dL Final  . BUN 09/30/2015 14  7 - 25 mg/dL Final  . Creat 09/30/2015 1.07  0.70 - 1.18 mg/dL Final   Comment:   For patients > or = 74 years of age: The upper reference limit for Creatinine is approximately 13% higher for people identified as African-American.     . Total Bilirubin 09/30/2015 0.7  0.2 - 1.2 mg/dL Final  . Alkaline Phosphatase 09/30/2015 41  40 - 115 U/L Final  . AST 09/30/2015 17  10 - 35 U/L Final  . ALT 09/30/2015 14  9 - 46 U/L Final  . Total Protein 09/30/2015 6.4  6.1 - 8.1 g/dL Final  . Albumin 09/30/2015 3.6  3.6 - 5.1 g/dL Final  . Calcium 09/30/2015 8.7  8.6 - 10.3 mg/dL Final  . GFR, Est African American 09/30/2015 79  >=60 mL/min Final  . GFR, Est Non African American 09/30/2015 68  >=60 mL/min Final  . PSA 10/01/2015 2.92  <=4.00 ng/mL Final   Comment: Test Methodology: ECLIA PSA (Electrochemiluminescence Immunoassay)   For PSA values from 2.5-4.0, particularly in younger men <18 years old, the AUA and NCCN suggest testing for % Free PSA (3515) and evaluation of the rate of increase in PSA (PSA velocity).   . Cholesterol 09/30/2015 110* 125 - 200  mg/dL Final  . Triglycerides 09/30/2015 61  <150 mg/dL Final  . HDL 09/30/2015 42  >=40 mg/dL Final  . Total CHOL/HDL Ratio 09/30/2015 2.6  <=5.0 Ratio Final  . VLDL 09/30/2015 12  <30 mg/dL Final  . LDL Cholesterol 09/30/2015 56  <130 mg/dL Final   Comment:   Total Cholesterol/HDL Ratio:CHD Risk                        Coronary Heart Disease Risk Table                                        Men       Women          1/2 Average Risk              3.4        3.3              Average Risk              5.0        4.4           2X Average Risk              9.6        7.1           3X Average Risk             23.4       11.0 Use the calculated Patient Ratio above and the CHD Risk table  to determine the patient's CHD Risk.     Past Medical History:  Diagnosis Date  . Atrial fibrillation with rapid ventricular response ([place]) 07/04/11   Xarelto started 4/13;    . CAD (coronary artery disease)    mild  nonobstructive by cath '05 (ASTEROID trial - mLAD 30%, CFX 20-30%, OM 20%, mRCA 30%, normal LVF), EF 65% by echo '12;    . Complication of anesthesia    "bowels fall asleep & he can't have BM"  . GERD (gastroesophageal reflux disease) 07/04/11   "once in awhile; haven't been treated for it"  . HLD (hyperlipidemia)   . HTN (hypertension)   . OSA (obstructive sleep apnea)    "suppose to be wearing my mask"  . Renal cell cancer, right ([place]) 2000   s/p partial neprhectomy   . Renal cell carcinoma   . Skin cancer 06/2011   left ear  . Stroke [facility]) 2007   denies residual   Past Surgical History:  Procedure Laterality Date  . CARDIAC CATHETERIZATION  ~ 2007  . KNEE ARTHROSCOPY Left 1990's  . PARTIAL NEPHRECTOMY Right 2000   "took off  25%"  . THYROIDECTOMY, PARTIAL  ~ 2007  . TONSILLECTOMY AND ADENOIDECTOMY     "as a child"  . TOTAL KNEE ARTHROPLASTY Left 2010   Current Outpatient Prescriptions on File Prior to Visit  Medication Sig Dispense Refill  . Cholecalciferol (VITAMIN D3) 1000 UNITS CAPS Take 2 capsules by mouth daily.     . colchicine 0.6 MG tablet Take 1 tablet (0.6 mg total) by mouth daily. 30 tablet 0  . Fish Oil OIL Take 800 mg by mouth daily.    . fluticasone (FLONASE) 50 MCG/ACT nasal spray PLACE 2 SPRAYS INTO THE NOSE EVERY EVENING. 16 g 3  . gabapentin (NEURONTIN) 300 MG capsule Take 1  capsule (300 mg total) by mouth 3 (three) times daily. 90 capsule 3  . metoprolol succinate (TOPROL-XL) 25 MG 24 hr tablet TAKE 1 TABLET BY MOUTH ONCE A DAY 90 tablet 0  . olmesartan-hydrochlorothiazide (BENICAR HCT) 40-25 MG tablet TAKE 1 TABLET BY MOUTH ONCE A DAY 90 tablet 3  . tadalafil (CIALIS) 20 MG tablet Take 20 mg by mouth daily as needed for erectile dysfunction.    . valsartan (DIOVAN) 160 MG tablet Take 1 tablet (160 mg total) by mouth daily. 30 tablet 11  . XARELTO 20 MG TABS tablet TAKE 1 TABLET BY MOUTH ONCE A DAY 90 tablet 1  . atorvastatin (LIPITOR) 20 MG tablet TAKE 1  TABLET BY MOUTH ONCE A DAY (Patient not taking: Reported on 10/05/2015) 90 tablet 3   No current facility-administered medications on file prior to visit.    Allergies  Allergen Reactions  . Contrast Media [Iodinated Diagnostic Agents] Other (See Comments)    "Isovue"; "blisters; all down my legs"   Social History   Social History  . Marital status: Married    Spouse name: N/A  . Number of children: N/A  . Years of education: N/A   Occupational History  . Not on file.   Social History Main Topics  . Smoking status: Former Smoker    Packs/day: 1.00    Years: 54.00    Types: Cigarettes    Quit date: 04/04/1977  . Smokeless tobacco: Former Systems developer    Types: Chew    Quit date: 10/04/2005  . Alcohol use Yes     Comment: rare  . Drug use: No  . Sexual activity: Yes     Comment: married happily to [place]   Other Topics Concern  . Not on file   Social History Narrative   Retired. Lives in [place]   Family History  Problem Relation Age of Onset  . Heart failure Mother     died at 73  . Diabetes Mother   . Colon cancer Neg Hx   . Prostate cancer Neg Hx   . Kidney cancer Father       Review of Systems  All other systems reviewed and are negative.      Objective:   Physical Exam  Constitutional: He is oriented to person, place, and time. He appears well-developed and well-nourished. No distress.  HENT:  Head: Normocephalic and atraumatic.  Right Ear: External ear normal.  Left Ear: External ear normal.  Nose: Nose normal.  Mouth/Throat: Oropharynx is clear and moist. No oropharyngeal exudate.  Eyes: Conjunctivae and EOM are normal. Pupils are equal, round, and reactive to light. Right eye exhibits no discharge. Left eye exhibits no discharge. No scleral icterus.  Neck: Normal range of motion. Neck supple. No JVD present. No tracheal deviation present. No thyromegaly present.  Cardiovascular: Normal rate, regular rhythm, normal heart sounds and intact distal  pulses.  Exam reveals no gallop and no friction rub.   No murmur heard. Pulmonary/Chest: Effort normal and breath sounds normal. No stridor. No respiratory distress. He has no wheezes. He has no rales. He exhibits no tenderness.  Abdominal: Soft. Bowel sounds are normal. He exhibits no distension and no mass. There is no tenderness. There is no rebound and no guarding.  Musculoskeletal: Normal range of motion. He exhibits no edema or tenderness.  Lymphadenopathy:    He has no cervical adenopathy.  Neurological: He is alert and oriented to person, place, and time. He has normal reflexes. No cranial  nerve deficit. He exhibits normal muscle tone. Coordination normal.  Skin: Skin is warm. No rash noted. He is not diaphoretic. No erythema. No pallor.  Psychiatric: He has a normal mood and affect. His behavior is normal. Judgment and thought content normal.  Vitals reviewed.         Assessment & Plan:  Routine general medical examination at a health care facility  Benign essential HTN  HLD (hyperlipidemia)  Acute idiopathic gout, unspecified site  Physical exam is completely normal. Received Pneumovax 23. Cancer screening is up-to-date. Prostate exam is nml.  Lab work is excellent. I recommended diet exercise and weight loss. Otherwise no change in therapy at this time.  He quit his Lipitor in January due to some muscle aches. Given his history of stroke, I recommended him resuming Lipitor 10 mg a day even though his cholesterol is excellent just received the beneficial effects of a statin.

## 2015-10-05 NOTE — Addendum Note (Signed)
Addended by: Shary Decamp B on: 10/05/2015 10:37 AM   Modules accepted: Orders

## 2015-11-01 ENCOUNTER — Other Ambulatory Visit: Payer: Self-pay | Admitting: Family Medicine

## 2015-11-18 DIAGNOSIS — L821 Other seborrheic keratosis: Secondary | ICD-10-CM | POA: Diagnosis not present

## 2015-11-18 DIAGNOSIS — D225 Melanocytic nevi of trunk: Secondary | ICD-10-CM | POA: Diagnosis not present

## 2015-11-18 DIAGNOSIS — D2362 Other benign neoplasm of skin of left upper limb, including shoulder: Secondary | ICD-10-CM | POA: Diagnosis not present

## 2015-11-18 DIAGNOSIS — L570 Actinic keratosis: Secondary | ICD-10-CM | POA: Diagnosis not present

## 2015-11-18 DIAGNOSIS — D485 Neoplasm of uncertain behavior of skin: Secondary | ICD-10-CM | POA: Diagnosis not present

## 2015-11-18 DIAGNOSIS — Z85828 Personal history of other malignant neoplasm of skin: Secondary | ICD-10-CM | POA: Diagnosis not present

## 2015-11-18 DIAGNOSIS — D1801 Hemangioma of skin and subcutaneous tissue: Secondary | ICD-10-CM | POA: Diagnosis not present

## 2015-12-23 DIAGNOSIS — H903 Sensorineural hearing loss, bilateral: Secondary | ICD-10-CM | POA: Diagnosis not present

## 2015-12-27 ENCOUNTER — Other Ambulatory Visit: Payer: Self-pay | Admitting: Otolaryngology

## 2015-12-27 DIAGNOSIS — H903 Sensorineural hearing loss, bilateral: Secondary | ICD-10-CM

## 2015-12-28 ENCOUNTER — Ambulatory Visit (INDEPENDENT_AMBULATORY_CARE_PROVIDER_SITE_OTHER): Payer: Medicare Other

## 2015-12-28 DIAGNOSIS — Z23 Encounter for immunization: Secondary | ICD-10-CM

## 2015-12-29 ENCOUNTER — Ambulatory Visit
Admission: RE | Admit: 2015-12-29 | Discharge: 2015-12-29 | Disposition: A | Discharge status: Home / Self Care | Payer: Medicare Other | Source: Ambulatory Visit | Attending: Otolaryngology | Admitting: Otolaryngology

## 2015-12-29 DIAGNOSIS — H9191 Unspecified hearing loss, right ear: Secondary | ICD-10-CM | POA: Diagnosis not present

## 2015-12-29 DIAGNOSIS — H903 Sensorineural hearing loss, bilateral: Secondary | ICD-10-CM

## 2015-12-29 MED ORDER — GADOBENATE DIMEGLUMINE 529 MG/ML IV SOLN
20.0000 mL | Freq: Once | INTRAVENOUS | Status: AC | PRN
  Administered 2015-12-29: 20 mL via INTRAVENOUS

## 2015-12-29 MED ORDER — GADOBENATE DIMEGLUMINE 529 MG/ML IV SOLN: 20.0000 mL | Freq: Once | INTRAVENOUS | Status: DC | PRN

## 2016-01-03 ENCOUNTER — Other Ambulatory Visit: Payer: Self-pay | Admitting: Family Medicine

## 2016-01-19 DIAGNOSIS — J342 Deviated nasal septum: Secondary | ICD-10-CM | POA: Diagnosis not present

## 2016-01-19 DIAGNOSIS — J324 Chronic pansinusitis: Secondary | ICD-10-CM | POA: Insufficient documentation

## 2016-01-26 ENCOUNTER — Encounter: Payer: Self-pay | Admitting: *Deleted

## 2016-01-31 ENCOUNTER — Other Ambulatory Visit: Payer: Self-pay | Admitting: Family Medicine

## 2016-01-31 DIAGNOSIS — J329 Chronic sinusitis, unspecified: Secondary | ICD-10-CM | POA: Diagnosis not present

## 2016-02-01 ENCOUNTER — Ambulatory Visit (INDEPENDENT_AMBULATORY_CARE_PROVIDER_SITE_OTHER): Payer: Medicare Other | Admitting: Cardiology

## 2016-02-01 ENCOUNTER — Encounter: Payer: Self-pay | Admitting: Cardiology

## 2016-02-01 VITALS — BP 177/85 | HR 66 | Ht 72.0 in | Wt 248.0 lb

## 2016-02-01 DIAGNOSIS — I4891 Unspecified atrial fibrillation: Secondary | ICD-10-CM

## 2016-02-01 DIAGNOSIS — Z7901 Long term (current) use of anticoagulants: Secondary | ICD-10-CM | POA: Insufficient documentation

## 2016-02-01 DIAGNOSIS — I10 Essential (primary) hypertension: Secondary | ICD-10-CM

## 2016-02-01 DIAGNOSIS — Z8673 Personal history of transient ischemic attack (TIA), and cerebral infarction without residual deficits: Secondary | ICD-10-CM | POA: Insufficient documentation

## 2016-02-01 DIAGNOSIS — G4733 Obstructive sleep apnea (adult) (pediatric): Secondary | ICD-10-CM

## 2016-02-01 DIAGNOSIS — G4730 Sleep apnea, unspecified: Secondary | ICD-10-CM | POA: Insufficient documentation

## 2016-02-01 DIAGNOSIS — I2510 Atherosclerotic heart disease of native coronary artery without angina pectoris: Secondary | ICD-10-CM

## 2016-02-01 DIAGNOSIS — I480 Paroxysmal atrial fibrillation: Secondary | ICD-10-CM | POA: Diagnosis not present

## 2016-02-01 NOTE — Assessment & Plan Note (Signed)
B/P up in the office today- he reports its better at home

## 2016-02-01 NOTE — Assessment & Plan Note (Signed)
Noted in 2013- converted spontaneously to NSR.

## 2016-02-01 NOTE — Assessment & Plan Note (Signed)
Xarelto- CHADs VASc=5 - for age, vascular disease, HTN, and CVA history

## 2016-02-01 NOTE — Patient Instructions (Signed)
Medication Instructions:  Ok to [facility] for your upcoming surgery on 02/04/16 starting today. Resume when the surgeon thinks its safe.  Labwork: None ordered  Testing/Procedures: None ordered  Follow-Up: Your physician recommends that you schedule a follow-up appointment in: 4-6 weeks with Dr.Crenshaw   Any Other Special Instructions Will Be Listed Below (If Applicable).     If you need a refill on your cardiac medications before your next appointment, please call your pharmacy.

## 2016-02-01 NOTE — Progress Notes (Signed)
02/01/2016 [place]   11/08/1940  [id number]  Primary Physician [place] TOM, MD Primary Cardiologist: [place] ([facility] 2013)  HPI:  75 y/o with a history of remote cath showing non obstructive CAD, obesity and sleep apnea-not compliant with C-pap, HTN, HLD, renal cell cancer-s/p nephrectomy in '00, and atrial fibrillation in 2013. There apparently has been no recurrence of his atrial fibrillation. The pt does have a history of a prior stroke in 2007. He had transient LLE weakness and was evaluated by neurology and placed on Aggrenox, this was stopped when he went on Xarelto in 2013. He is here today for pre op clearance. His echo in 2-013 showed normal LVF. He has not had angina and is not limited in his activity. He denies palpitations. He is here today for pre op clearance prior to sinus surgery later this week.    Current Outpatient Prescriptions  Medication Sig Dispense Refill  . atorvastatin (LIPITOR) 20 MG tablet Take 20 mg by mouth every other day.    . Cholecalciferol (VITAMIN D3) 1000 UNITS CAPS Take 2 capsules by mouth daily.     . colchicine 0.6 MG tablet Take 1 tablet (0.6 mg total) by mouth daily. 30 tablet 0  . Fish Oil OIL Take 800 mg by mouth daily.    . fluticasone (FLONASE) 50 MCG/ACT nasal spray PLACE 2 SPRAYS INTO THE NOSE EVERY EVENING. 16 g 3  . metoprolol succinate (TOPROL-XL) 25 MG 24 hr tablet TAKE 1 TABLET BY MOUTH ONCE A DAY 90 tablet 3  . olmesartan-hydrochlorothiazide (BENICAR HCT) 40-25 MG tablet Take 0.5 tablets by mouth daily.    . tadalafil (CIALIS) 20 MG tablet Take 20 mg by mouth daily as needed for erectile dysfunction.    [place] 20 MG TABS tablet TAKE 1 TABLET BY MOUTH ONCE A DAY 90 tablet 1   No current facility-administered medications for this visit.     Allergies  Allergen Reactions  . Contrast Media [Iodinated Diagnostic Agents] Other (See Comments)    "[place]"; "blisters; all down my legs"  . Metrizamide Other (See Comments)     "[place]"; "blisters; all down my legs"    Social History   Social History  . Marital status: Married    Spouse name: N/A  . Number of children: N/A  . Years of education: N/A   Occupational History  . Not on file.   Social History Main Topics  . Smoking status: Former Smoker    Packs/day: 1.00    Years: 54.00    Types: Cigarettes    Quit date: 04/04/1977  . Smokeless tobacco: Former Systems developer    Types: Chew    Quit date: 10/04/2005  . Alcohol use Yes     Comment: rare  . Drug use: No  . Sexual activity: Yes     Comment: married happily to [place] Martin   Other Topics Concern  . Not on file   Social History Narrative   Retired. Lives in [place]     Review of Systems: General: negative for chills, fever, night sweats or weight changes.  Cardiovascular: negative for chest pain, dyspnea on exertion, edema, orthopnea, palpitations, paroxysmal nocturnal dyspnea or shortness of breath Dermatological: negative for rash Respiratory: negative for cough or wheezing Urologic: negative for hematuria Abdominal: negative for nausea, vomiting, diarrhea, bright red blood per rectum, melena, or hematemesis Neurologic: negative for visual changes, syncope, or dizziness All other systems reviewed and are otherwise negative except as noted above.  Blood pressure (!) 177/85, pulse 66, height 6' (1.829 m), weight 248 lb (112.5 kg).  General appearance: alert, cooperative, no distress and moderately obese Neck: no carotid bruit and no JVD Lungs: clear to auscultation bilaterally Heart: regular rate and rhythm Extremities: extremities normal, atraumatic, no cyanosis or edema Pulses: 2+ and symmetric Skin: Skin color, texture, turgor normal. No rashes or lesions Neurologic: Grossly normal  EKG NSR, RBBB  ASSESSMENT AND PLAN:   PAF (paroxysmal atrial fibrillation) ([place]) Noted in 2013- converted spontaneously to NSR.  Chronic anticoagulation Xarelto- CHADs VASc=5 - for age, vascular  disease, HTN, and CVA history  Essential hypertension B/P up in the office today- he reports its better at home  History of stroke Pt evaluated by neurology and treated with Aggrenox in 2007 for possible stroke (transient LLE weakness)  Sleep apnea Not able to tolerate C-pap  CAD (coronary artery disease) Non obstructive CAD at cath in 2005 Pt has no history of chest pain and is not limited in his physical activity.    PLAN  Discussed with Henry Martin today in the office. The pt is OK to stop Xarelto pre op for his upcoming sinus surgery Friday this week and needs no further cardiac work up pre op.  Xarelto should be resumed as soon as Henry Martin feels its safe to do so post op.   Henry Ransom PA-C 02/01/2016 3:22 PM

## 2016-02-01 NOTE — Assessment & Plan Note (Signed)
Non obstructive CAD at cath in 2005 Pt has no history of chest pain and is not limited in his physical activity.

## 2016-02-01 NOTE — Assessment & Plan Note (Signed)
Not able to tolerate C-pap

## 2016-02-01 NOTE — Assessment & Plan Note (Signed)
Pt evaluated by neurology and treated with Aggrenox in 2007 for possible stroke (transient LLE weakness)

## 2016-02-04 ENCOUNTER — Other Ambulatory Visit: Payer: Self-pay

## 2016-02-04 DIAGNOSIS — J338 Other polyp of sinus: Secondary | ICD-10-CM | POA: Diagnosis not present

## 2016-02-04 DIAGNOSIS — J329 Chronic sinusitis, unspecified: Secondary | ICD-10-CM | POA: Diagnosis not present

## 2016-02-04 DIAGNOSIS — J324 Chronic pansinusitis: Secondary | ICD-10-CM | POA: Diagnosis not present

## 2016-02-04 DIAGNOSIS — J3489 Other specified disorders of nose and nasal sinuses: Secondary | ICD-10-CM | POA: Diagnosis not present

## 2016-02-04 DIAGNOSIS — J320 Chronic maxillary sinusitis: Secondary | ICD-10-CM | POA: Diagnosis not present

## 2016-02-04 DIAGNOSIS — J321 Chronic frontal sinusitis: Secondary | ICD-10-CM | POA: Diagnosis not present

## 2016-02-04 DIAGNOSIS — J322 Chronic ethmoidal sinusitis: Secondary | ICD-10-CM | POA: Diagnosis not present

## 2016-02-04 DIAGNOSIS — J342 Deviated nasal septum: Secondary | ICD-10-CM | POA: Diagnosis not present

## 2016-02-10 DIAGNOSIS — J324 Chronic pansinusitis: Secondary | ICD-10-CM | POA: Diagnosis not present

## 2016-02-16 DIAGNOSIS — J324 Chronic pansinusitis: Secondary | ICD-10-CM | POA: Diagnosis not present

## 2016-02-29 DIAGNOSIS — J324 Chronic pansinusitis: Secondary | ICD-10-CM | POA: Diagnosis not present

## 2016-03-14 DIAGNOSIS — H903 Sensorineural hearing loss, bilateral: Secondary | ICD-10-CM | POA: Diagnosis not present

## 2016-04-24 DIAGNOSIS — H903 Sensorineural hearing loss, bilateral: Secondary | ICD-10-CM | POA: Diagnosis not present

## 2016-05-09 NOTE — Progress Notes (Deleted)
HPI: Pleasant male for FU of atrial fibrillation. He has a history of nonobstructive CAD by cardiac catheterization in 2005. Nuclear study January 2010 showed inferior thinning but no ischemia. He was admitted 4/13 with AFib with RVR in the setting of viral gastroenteritis. 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  Since last seen,   Current Outpatient Prescriptions  Medication Sig Dispense Refill  . atorvastatin (LIPITOR) 20 MG tablet Take 20 mg by mouth every other day.    . Cholecalciferol (VITAMIN D3) 1000 UNITS CAPS Take 2 capsules by mouth daily.     . colchicine 0.6 MG tablet Take 1 tablet (0.6 mg total) by mouth daily. 30 tablet 0  . Fish Oil OIL Take 800 mg by mouth daily.    . fluticasone (FLONASE) 50 MCG/ACT nasal spray PLACE 2 SPRAYS INTO THE NOSE EVERY EVENING. 16 g 3  . metoprolol succinate (TOPROL-XL) 25 MG 24 hr tablet TAKE 1 TABLET BY MOUTH ONCE A DAY 90 tablet 3  . olmesartan-hydrochlorothiazide (BENICAR HCT) 40-25 MG tablet Take 0.5 tablets by mouth daily.    . tadalafil (CIALIS) 20 MG tablet Take 20 mg by mouth daily as needed for erectile dysfunction.    [place] 20 MG TABS tablet TAKE 1 TABLET BY MOUTH ONCE A DAY 90 tablet 1   No current facility-administered medications for this visit.      Past Medical History:  Diagnosis Date  . Atrial fibrillation with rapid ventricular response ([place]) 07/04/11   Xarelto started 4/13;    . CAD (coronary artery disease)    mild nonobstructive by cath '05 (ASTEROID trial - mLAD 30%, CFX 20-30%, OM 20%, mRCA 30%, normal LVF), EF 65% by echo '12;    . Complication of anesthesia    "bowels fall asleep & he can't have BM"  . GERD (gastroesophageal reflux disease) 07/04/11   "once in awhile; haven't been treated for it"  . HLD (hyperlipidemia)   . HTN (hypertension)   . OSA (obstructive sleep apnea)    "suppose to be wearing my mask"  . Renal cell cancer, right ([place]) 2000   s/p partial neprhectomy   . Renal cell carcinoma   . Skin cancer 06/2011   left ear  . Stroke [place]) 2007   denies residual    Past Surgical History:  Procedure Laterality Date  . CARDIAC CATHETERIZATION  ~ 2007  . KNEE ARTHROSCOPY Left 1990's  . PARTIAL NEPHRECTOMY Right 2000   "took off  25%"  . THYROIDECTOMY, PARTIAL  ~ 2007  . TONSILLECTOMY AND ADENOIDECTOMY     "as a child"  . TOTAL KNEE ARTHROPLASTY Left 2010    Social History   Social History  . Marital status: Married    Spouse name: N/A  . Number of children: N/A  . Years of education: N/A   Occupational History  . Not on file.   Social History Main Topics  . Smoking status: Former Smoker    Packs/day: 1.00    Years: 54.00    Types: Cigarettes    Quit date: 04/04/1977  . Smokeless tobacco: Former Systems developer    Types: Chew    Quit date: 10/04/2005  . Alcohol use Yes     Comment: rare  . Drug use: No  . Sexual activity: Yes     Comment: married happily to Henry Martin   Other Topics Concern  .  Not on file   Social History Narrative   Retired. Lives in [place]    Family History  Problem Relation Age of Onset  . Heart failure Mother     died at 53  . Diabetes Mother   . Kidney cancer Father   . Colon cancer Neg Hx   . Prostate cancer Neg Hx     ROS: no fevers or chills, productive cough, hemoptysis, dysphasia, odynophagia, melena, hematochezia, dysuria, hematuria, rash, seizure activity, orthopnea, PND, pedal edema, claudication. Remaining systems are negative.  Physical Exam: Well-developed well-nourished in no acute distress.  Skin is warm and dry.  HEENT is normal.  Neck is supple.  Chest is clear to auscultation with normal expansion.  Cardiovascular exam is regular rate and rhythm.  Abdominal exam nontender or distended. No masses palpated. Extremities show no edema. neuro grossly intact  ECG- personally  reviewed  A/P  1  Henry Ruths, MD

## 2016-05-16 ENCOUNTER — Ambulatory Visit: Payer: Medicare Other | Admitting: Cardiology

## 2016-07-02 IMAGING — CR DG CHEST 2V
3 series · 3 of 3 positions shown · non-contrast
Comparison: PA and lateral chest 11/08/2012 and 11/14/2011.

CLINICAL DATA: History of renal cell carcinoma.

EXAM:
CHEST  2 VIEW

[w chest pa]
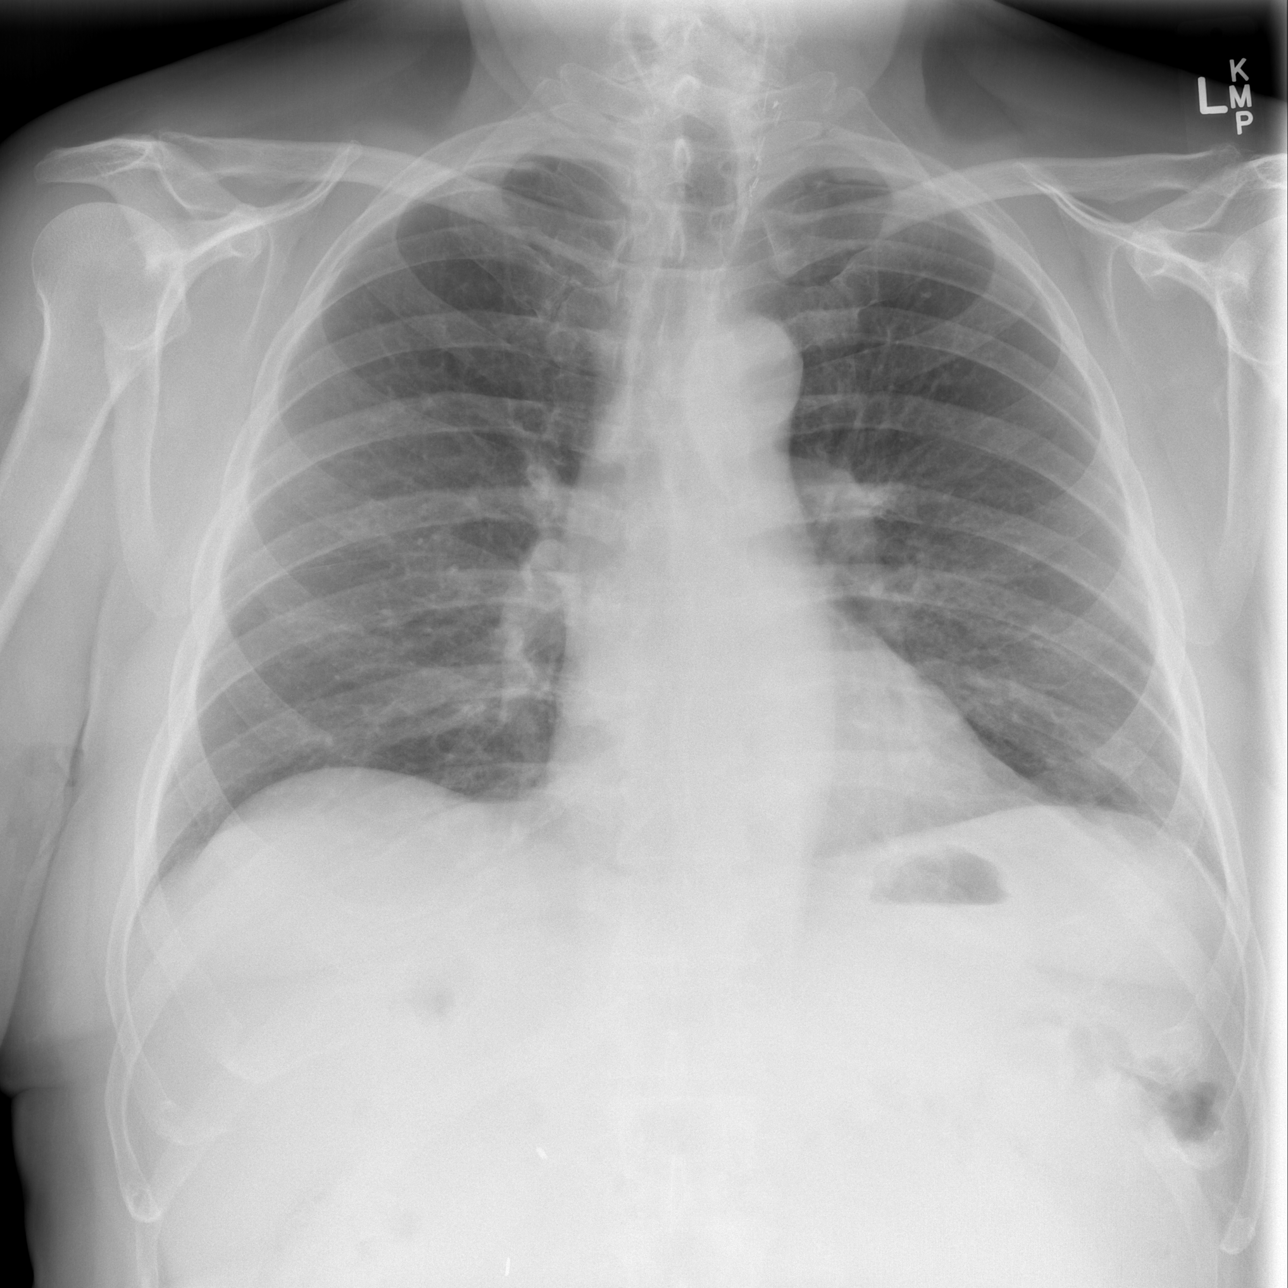

[w chest lat (1 of 2)]
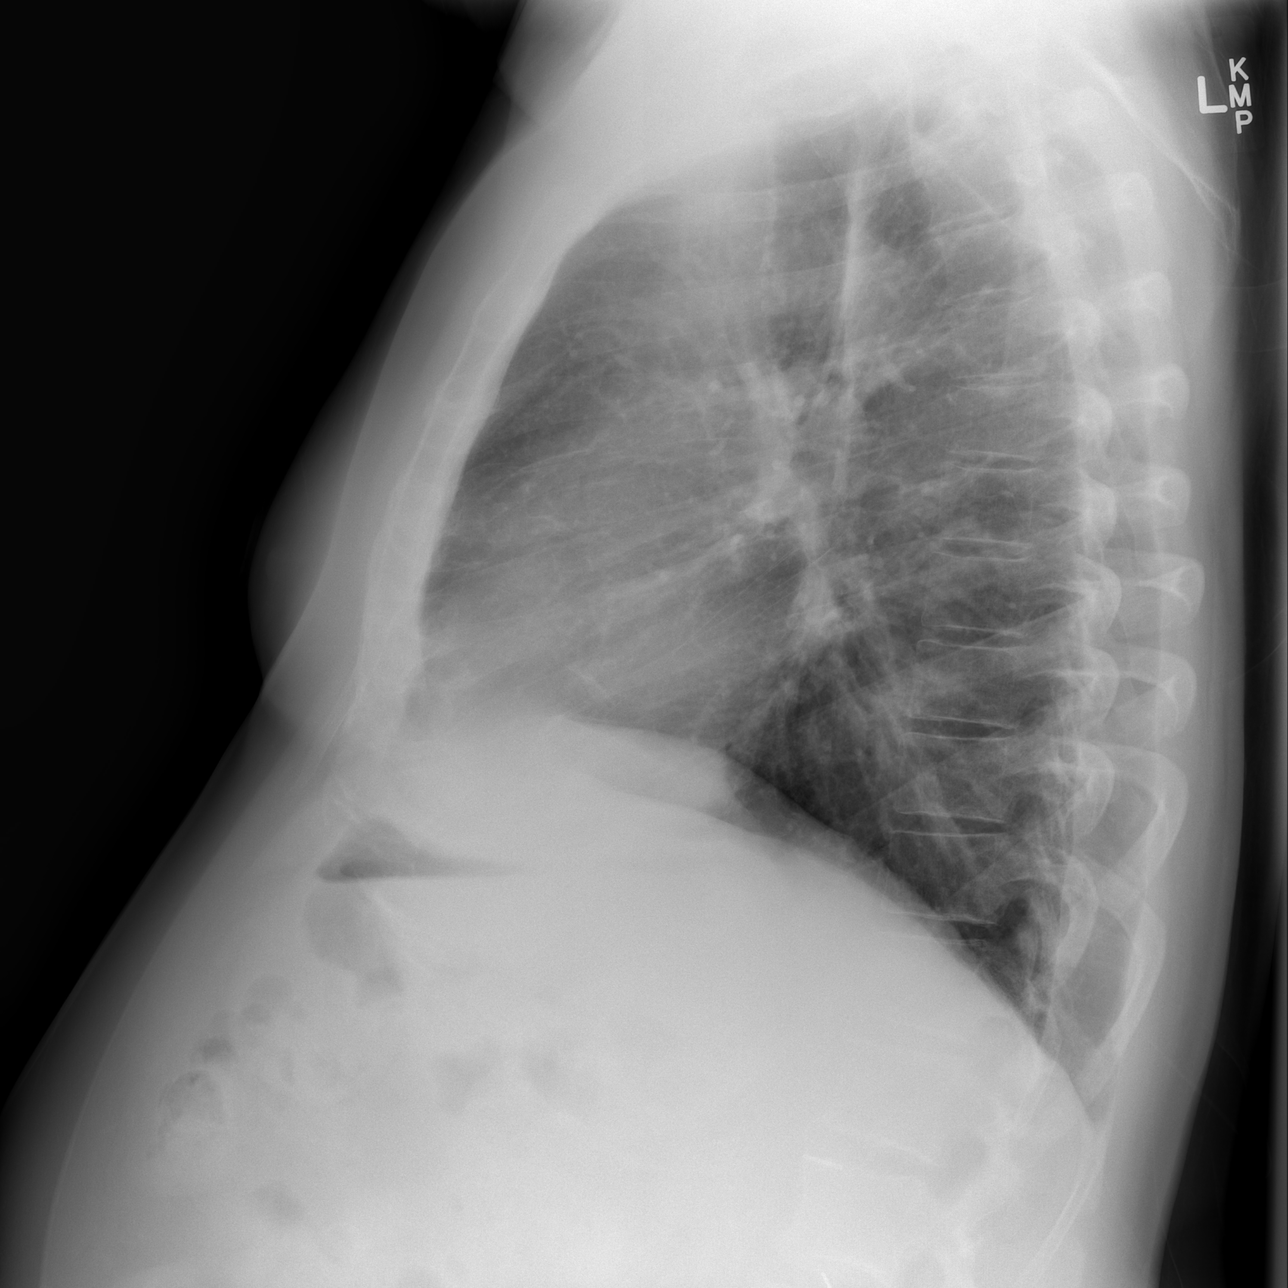

[w chest lat (2 of 2)]
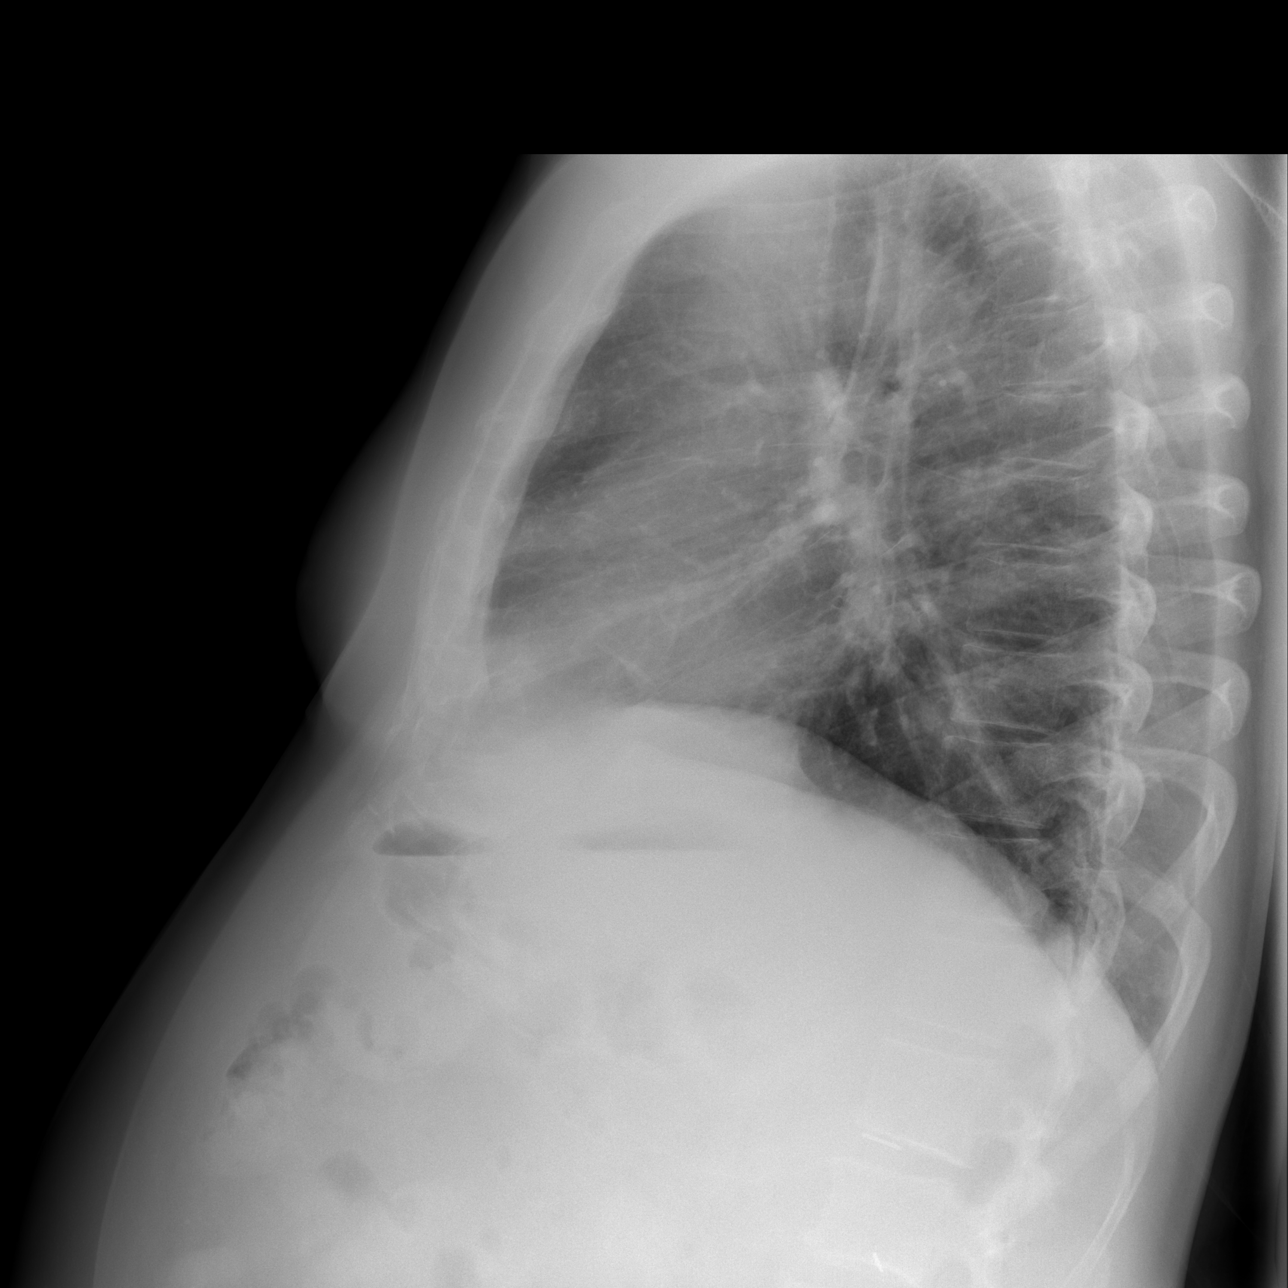

[3 of 3 positions shown; findings below may reference images not displayed]

FINDINGS: The lungs are clear. There is no pneumothorax or pleural effusion.
Heart size is normal. No focal bony abnormality is identified.
Surgical clips at the base of the neck on the left are most
consistent with prior thyroid surgery.
IMPRESSION: Negative for metastatic or acute disease. Stable compared to prior
exam.

## 2016-07-24 ENCOUNTER — Ambulatory Visit: Payer: Self-pay | Admitting: Cardiology

## 2016-08-25 NOTE — Progress Notes (Signed)
HPI: FU atrial fibrillation. History of nonobstructive CAD by cardiac catheterization in 2005 (ASTEROID trial). He was admitted 4/13 with AFib with RVR in the setting of viral gastroenteritis. 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 Since last seen, the patient denies any dyspnea on exertion, orthopnea, PND, pedal edema, palpitations, syncope or chest pain.   Current Outpatient Prescriptions  Medication Sig Dispense Refill  . atorvastatin (LIPITOR) 20 MG tablet Take 10 mg by mouth every other day.     . Cholecalciferol (VITAMIN D3) 1000 UNITS CAPS Take 2 capsules by mouth daily.     . Fish Oil OIL Take 800 mg by mouth daily.    . fluticasone (FLONASE) 50 MCG/ACT nasal spray PLACE 2 SPRAYS INTO THE NOSE EVERY EVENING. 16 g 3  . metoprolol succinate (TOPROL-XL) 25 MG 24 hr tablet Take 12.5 mg by mouth daily.    Marland Kitchen olmesartan-hydrochlorothiazide (BENICAR HCT) 40-25 MG tablet Take 1 tablet by mouth daily.     . tadalafil (CIALIS) 20 MG tablet Take 20 mg by mouth daily as needed for erectile dysfunction.    [name] 20 MG TABS tablet TAKE 1 TABLET BY MOUTH ONCE A DAY 90 tablet 1   No current facility-administered medications for this visit.      Past Medical History:  Diagnosis Date  . Atrial fibrillation with rapid ventricular response ([place]) 07/04/11   Xarelto started 4/13;    . CAD (coronary artery disease)    mild nonobstructive by cath '05 (ASTEROID trial - mLAD 30%, CFX 20-30%, OM 20%, mRCA 30%, normal LVF), EF 65% by echo '12;    . Complication of anesthesia    "bowels fall asleep & he can't have BM"  . GERD (gastroesophageal reflux disease) 07/04/11   "once in awhile; haven't been treated for it"  . HLD (hyperlipidemia)   . HTN (hypertension)   . OSA (obstructive sleep apnea)    "suppose to be wearing my mask"  . Renal cell cancer, right ([place]) 2000   s/p  partial neprhectomy   . Renal cell carcinoma   . Skin cancer 06/2011   left ear  . Stroke [facility]) 2007   denies residual    Past Surgical History:  Procedure Laterality Date  . CARDIAC CATHETERIZATION  ~ 2007  . KNEE ARTHROSCOPY Left 1990's  . PARTIAL NEPHRECTOMY Right 2000   "took off  25%"  . THYROIDECTOMY, PARTIAL  ~ 2007  . TONSILLECTOMY AND ADENOIDECTOMY     "as a child"  . TOTAL KNEE ARTHROPLASTY Left 2010    Social History   Social History  . Marital status: Married    Spouse name: N/A  . Number of children: N/A  . Years of education: N/A   Occupational History  . Not on file.   Social History Main Topics  . Smoking status: Former Smoker    Packs/day: 1.00    Years: 54.00    Types: Cigarettes    Quit date: 04/04/1977  . Smokeless tobacco: Former Systems developer    Types: Chew    Quit date: 10/04/2005  . Alcohol use Yes     Comment: rare  . Drug use: No  . Sexual activity: Yes     Comment: married happily to Bruce   Other Topics Concern  . Not on file   Social History Narrative   Retired. Lives in [place]  Family History  Problem Relation Age of Onset  . Heart failure Mother        died at 91  . Diabetes Mother   . Kidney cancer Father   . Colon cancer Neg Hx   . Prostate cancer Neg Hx     ROS: no fevers or chills, productive cough, hemoptysis, dysphasia, odynophagia, melena, hematochezia, dysuria, hematuria, rash, seizure activity, orthopnea, PND, pedal edema, claudication. Remaining systems are negative.  Physical Exam: Well-developed obese in no acute distress.  Skin is warm and dry.  HEENT is normal.  Neck is supple. No bruits Chest is clear to auscultation with normal expansion.  Cardiovascular exam is regular rate and rhythm.  Abdominal exam nontender or distended. No masses palpated. Extremities show no edema. neuro grossly intact  ECG- Sinus rhythm at a rate of 64. Left ventricular hypertrophy. Left anterior fascicular block. Right  bundle branch block. personally reviewed  A/P  1 Paroxysmal atrial fibrillation-patient remains in sinus rhythm. Continue beta blocker for rate control if atrial fibrillation recurs. Continue xarelto. Hemoglobin and renal function monitored by primary care.  2 hypertension-blood pressure is controlled. Continue present medications.  3 coronary artery disease-continue statin. No aspirin given need for anticoagulation.  4 hyperlipidemia-continue statin. Lipids and liver monitored by primary care.  5 dilated aortic root-repeat echocardiogram to reassess.  6 history of tobacco use-patient has remote history of tobacco use. I will obtain his most recent abdominal CT from urology to make sure that he has had evaluation of his abdominal aorta to exclude aneurysm.   Kirk Ruths, MD

## 2016-08-31 ENCOUNTER — Encounter: Payer: Self-pay | Admitting: Cardiology

## 2016-08-31 ENCOUNTER — Ambulatory Visit (INDEPENDENT_AMBULATORY_CARE_PROVIDER_SITE_OTHER): Payer: Medicare HMO | Admitting: Cardiology

## 2016-08-31 VITALS — BP 124/82 | HR 64 | Ht 72.0 in | Wt 253.6 lb

## 2016-08-31 DIAGNOSIS — I77810 Thoracic aortic ectasia: Secondary | ICD-10-CM

## 2016-08-31 DIAGNOSIS — I10 Essential (primary) hypertension: Secondary | ICD-10-CM

## 2016-08-31 DIAGNOSIS — I480 Paroxysmal atrial fibrillation: Secondary | ICD-10-CM

## 2016-08-31 NOTE — Addendum Note (Signed)
Addended by: Cristopher Estimable on: 08/31/2016 10:29 AM   Modules accepted: Orders

## 2016-08-31 NOTE — Patient Instructions (Addendum)

## 2016-09-08 ENCOUNTER — Telehealth: Payer: Self-pay | Admitting: Cardiology

## 2016-09-08 DIAGNOSIS — R0989 Other specified symptoms and signs involving the circulatory and respiratory systems: Secondary | ICD-10-CM

## 2016-09-08 NOTE — Telephone Encounter (Signed)
New message    Pt states he is calling returning call to [facility].

## 2016-09-09 ENCOUNTER — Other Ambulatory Visit: Payer: Self-pay | Admitting: Family Medicine

## 2016-09-13 ENCOUNTER — Ambulatory Visit (HOSPITAL_COMMUNITY): Payer: Medicare HMO | Attending: Cardiovascular Disease

## 2016-09-13 ENCOUNTER — Other Ambulatory Visit: Payer: Self-pay

## 2016-09-13 DIAGNOSIS — I083 Combined rheumatic disorders of mitral, aortic and tricuspid valves: Secondary | ICD-10-CM | POA: Diagnosis not present

## 2016-09-13 DIAGNOSIS — I77810 Thoracic aortic ectasia: Secondary | ICD-10-CM

## 2016-09-13 DIAGNOSIS — I5030 Unspecified diastolic (congestive) heart failure: Secondary | ICD-10-CM | POA: Diagnosis not present

## 2016-09-13 NOTE — Telephone Encounter (Signed)
Spoke with pt wife, abdominal US scheduled.

## 2016-09-13 NOTE — Telephone Encounter (Signed)
Records from [facility] urology received and reviewed by dr Stanford Breed. Abdominal CT scan from the past were without contrast, the aorta was not assessed. He will need an abdominal US to r/o AAA due to bruit, left message for patient to call to schedule.

## 2016-09-15 DIAGNOSIS — E7800 Pure hypercholesterolemia, unspecified: Secondary | ICD-10-CM | POA: Diagnosis not present

## 2016-09-15 DIAGNOSIS — I4892 Unspecified atrial flutter: Secondary | ICD-10-CM | POA: Diagnosis not present

## 2016-09-15 DIAGNOSIS — E669 Obesity, unspecified: Secondary | ICD-10-CM | POA: Diagnosis not present

## 2016-09-15 DIAGNOSIS — L814 Other melanin hyperpigmentation: Secondary | ICD-10-CM | POA: Diagnosis not present

## 2016-09-15 DIAGNOSIS — I480 Paroxysmal atrial fibrillation: Secondary | ICD-10-CM | POA: Diagnosis not present

## 2016-09-15 DIAGNOSIS — Z6834 Body mass index (BMI) 34.0-34.9, adult: Secondary | ICD-10-CM | POA: Diagnosis not present

## 2016-09-15 DIAGNOSIS — H9113 Presbycusis, bilateral: Secondary | ICD-10-CM | POA: Diagnosis not present

## 2016-09-15 DIAGNOSIS — M238X1 Other internal derangements of right knee: Secondary | ICD-10-CM | POA: Diagnosis not present

## 2016-09-15 DIAGNOSIS — Z0000 Encounter for general adult medical examination without abnormal findings: Secondary | ICD-10-CM | POA: Diagnosis not present

## 2016-09-15 DIAGNOSIS — I10 Essential (primary) hypertension: Secondary | ICD-10-CM | POA: Diagnosis not present

## 2016-09-27 ENCOUNTER — Other Ambulatory Visit: Payer: Medicare HMO

## 2016-09-28 ENCOUNTER — Ambulatory Visit (HOSPITAL_COMMUNITY)
Admission: RE | Admit: 2016-09-28 | Discharge: 2016-09-28 | Disposition: A | Discharge status: Home / Self Care | Payer: Medicare HMO | Source: Ambulatory Visit | Attending: Cardiology | Admitting: Cardiology

## 2016-09-28 DIAGNOSIS — Z87891 Personal history of nicotine dependence: Secondary | ICD-10-CM | POA: Diagnosis not present

## 2016-09-28 DIAGNOSIS — Z136 Encounter for screening for cardiovascular disorders: Secondary | ICD-10-CM | POA: Diagnosis not present

## 2016-09-28 DIAGNOSIS — R0989 Other specified symptoms and signs involving the circulatory and respiratory systems: Secondary | ICD-10-CM | POA: Insufficient documentation

## 2016-10-02 ENCOUNTER — Other Ambulatory Visit: Payer: Self-pay

## 2016-10-02 DIAGNOSIS — E785 Hyperlipidemia, unspecified: Secondary | ICD-10-CM | POA: Diagnosis not present

## 2016-10-02 DIAGNOSIS — Z125 Encounter for screening for malignant neoplasm of prostate: Secondary | ICD-10-CM | POA: Diagnosis not present

## 2016-10-02 DIAGNOSIS — Z0000 Encounter for general adult medical examination without abnormal findings: Secondary | ICD-10-CM

## 2016-10-02 DIAGNOSIS — I10 Essential (primary) hypertension: Secondary | ICD-10-CM | POA: Diagnosis not present

## 2016-10-02 LAB — CBC WITH DIFFERENTIAL/PLATELET
BASOS ABS: 77 {cells}/uL (ref 0–200)
Basophils Relative: 1 %
EOS ABS: 231 {cells}/uL (ref 15–500)
EOS PCT: 3 %
HCT: 43.2 % (ref 38.5–50.0)
HEMOGLOBIN: 14.2 g/dL (ref 13.0–17.0)
LYMPHS ABS: 2695 {cells}/uL (ref 850–3900)
Lymphocytes Relative: 35 %
MCH: 29.2 pg (ref 27.0–33.0)
MCHC: 32.9 g/dL (ref 32.0–36.0)
MCV: 88.9 fL (ref 80.0–100.0)
MPV: 10.7 fL (ref 7.5–12.5)
Monocytes Absolute: 847 cells/uL (ref 200–950)
Monocytes Relative: 11 %
NEUTROS PCT: 50 %
Neutro Abs: 3850 cells/uL (ref 1500–7800)
Platelets: 195 10*3/uL (ref 140–400)
RBC: 4.86 MIL/uL (ref 4.20–5.80)
RDW: 13.5 % (ref 11.0–15.0)
WBC: 7.7 10*3/uL (ref 3.8–10.8)

## 2016-10-03 LAB — COMPLETE METABOLIC PANEL WITH GFR
ALBUMIN: 3.7 g/dL (ref 3.6–5.1)
ALT: 19 U/L (ref 9–46)
AST: 15 U/L (ref 10–35)
Alkaline Phosphatase: 44 U/L (ref 40–115)
BILIRUBIN TOTAL: 0.6 mg/dL (ref 0.2–1.2)
BUN: 15 mg/dL (ref 7–25)
CALCIUM: 9.3 mg/dL (ref 8.6–10.3)
CHLORIDE: 106 mmol/L (ref 98–110)
CO2: 27 mmol/L (ref 20–31)
Creat: 1.05 mg/dL (ref 0.70–1.18)
GFR, EST AFRICAN AMERICAN: 80 mL/min (ref >=60)
GFR, Est Non African American: 69 mL/min (ref >=60)
GLUCOSE: 116 mg/dL — AB (ref 70–99)
Potassium: 4.6 mmol/L (ref 3.5–5.3)
SODIUM: 140 mmol/L (ref 135–146)
TOTAL PROTEIN: 6.1 g/dL (ref 6.1–8.1)

## 2016-10-03 LAB — LIPID PANEL
Cholesterol: 113 mg/dL (ref <200)
HDL: 37 mg/dL — AB (ref >40)
LDL CALC: 61 mg/dL (ref <100)
Total CHOL/HDL Ratio: 3.1 Ratio (ref <5.0)
Triglycerides: 77 mg/dL (ref <150)
VLDL: 15 mg/dL (ref <30)

## 2016-10-03 LAB — PSA: PSA: 2.8 ng/mL (ref <=4.0)

## 2016-10-04 ENCOUNTER — Other Ambulatory Visit: Payer: Self-pay

## 2016-10-09 ENCOUNTER — Encounter: Payer: Self-pay | Admitting: Family Medicine

## 2016-10-09 ENCOUNTER — Ambulatory Visit (INDEPENDENT_AMBULATORY_CARE_PROVIDER_SITE_OTHER): Payer: Medicare HMO | Admitting: Family Medicine

## 2016-10-09 VITALS — BP 130/60 | HR 80 | Temp 98.4°F | Resp 16 | Ht 72.0 in | Wt 250.0 lb

## 2016-10-09 DIAGNOSIS — Z86010 Personal history of colonic polyps: Secondary | ICD-10-CM | POA: Diagnosis not present

## 2016-10-09 DIAGNOSIS — I10 Essential (primary) hypertension: Secondary | ICD-10-CM | POA: Diagnosis not present

## 2016-10-09 DIAGNOSIS — Z8673 Personal history of transient ischemic attack (TIA), and cerebral infarction without residual deficits: Secondary | ICD-10-CM

## 2016-10-09 DIAGNOSIS — I2510 Atherosclerotic heart disease of native coronary artery without angina pectoris: Secondary | ICD-10-CM | POA: Diagnosis not present

## 2016-10-09 DIAGNOSIS — Z0000 Encounter for general adult medical examination without abnormal findings: Secondary | ICD-10-CM | POA: Diagnosis not present

## 2016-10-09 DIAGNOSIS — I480 Paroxysmal atrial fibrillation: Secondary | ICD-10-CM

## 2016-10-09 DIAGNOSIS — E7800 Pure hypercholesterolemia, unspecified: Secondary | ICD-10-CM | POA: Diagnosis not present

## 2016-10-09 DIAGNOSIS — G4733 Obstructive sleep apnea (adult) (pediatric): Secondary | ICD-10-CM | POA: Diagnosis not present

## 2016-10-09 MED ORDER — FLUTICASONE PROPIONATE 50 MCG/ACT NA SUSP: NASAL | 3 refills | Status: DC

## 2016-10-09 MED ORDER — TADALAFIL 20 MG PO TABS: 20.0000 mg | ORAL_TABLET | Freq: Every day | ORAL | 11 refills | Status: DC | PRN

## 2016-10-09 NOTE — Progress Notes (Signed)
Subjective:    Patient ID: Henry Martin, male    DOB: 02-06-1941, 76 y.o.   MRN: [id number]  HPI Patient is here today for complete physical exam.  Last colonoscopy was in 2014 and revealed 1 sessile polyp and biopsy revealed tubular adenoma.  GI recommended repeat colonoscopy in 2019. He has been released from his urologist regarding his renal cell carcinoma as he has been cancer free, 16 years. Since the last time I saw the patient, he underwent elective sinus surgery. Prior that he receive cardiovascular currently in its from his cardiologist. He had an echocardiogram that was completely normal. They also screen the patient for a AAA and it was negative. He's been doing extremely well and is having no concerns. Immunizations are up-to-date.  Immunization History  Administered Date(s) Administered  . Influenza,inj,Quad PF,36+ Mos 01/17/2013, 12/18/2013, 01/12/2015, 12/28/2015  . Influenza-Unspecified 01/04/2006, 12/05/2006, 12/17/2007, 12/19/2008, 12/31/2009, 02/03/2011, 01/11/2012  . Pneumococcal Conjugate-13 09/02/2013  . Pneumococcal Polysaccharide-23 12/14/2003, 10/05/2015  . Td 05/05/1998, 06/24/2008  . Tdap 06/24/2008  . Zoster 03/15/2007    His most recent lab work as listed below Appointment on 10/02/2016  Component Date Value Ref Range Status  . WBC 10/02/2016 7.7  3.8 - 10.8 K/uL Final  . RBC 10/02/2016 4.86  4.20 - 5.80 MIL/uL Final  . Hemoglobin 10/02/2016 14.2  13.0 - 17.0 g/dL Final  . HCT 10/02/2016 43.2  38.5 - 50.0 % Final  . MCV 10/02/2016 88.9  80.0 - 100.0 fL Final  . MCH 10/02/2016 29.2  27.0 - 33.0 pg Final  . MCHC 10/02/2016 32.9  32.0 - 36.0 g/dL Final  . RDW 10/02/2016 13.5  11.0 - 15.0 % Final  . Platelets 10/02/2016 195  140 - 400 K/uL Final  . MPV 10/02/2016 10.7  7.5 - 12.5 fL Final  . Neutro Abs 10/02/2016 3850  1,500 - 7,800 cells/uL Final  . Lymphs Abs 10/02/2016 2695  850 - 3,900 cells/uL Final  . Monocytes Absolute 10/02/2016 847  200 - 950  cells/uL Final  . Eosinophils Absolute 10/02/2016 231  15 - 500 cells/uL Final  . Basophils Absolute 10/02/2016 77  0 - 200 cells/uL Final  . Neutrophils Relative % 10/02/2016 50  % Final  . Lymphocytes Relative 10/02/2016 35  % Final  . Monocytes Relative 10/02/2016 11  % Final  . Eosinophils Relative 10/02/2016 3  % Final  . Basophils Relative 10/02/2016 1  % Final  . Smear Review 10/02/2016 Criteria for review not met   Final  . Sodium 10/02/2016 140  135 - 146 mmol/L Final  . Potassium 10/02/2016 4.6  3.5 - 5.3 mmol/L Final  . Chloride 10/02/2016 106  98 - 110 mmol/L Final  . CO2 10/02/2016 27  20 - 31 mmol/L Final  . Glucose, Bld 10/02/2016 116* 70 - 99 mg/dL Final  . BUN 10/02/2016 15  7 - 25 mg/dL Final  . Creat 10/02/2016 1.05  0.70 - 1.18 mg/dL Final   Comment:   For patients > or = 75 years of age: The upper reference limit for Creatinine is approximately 13% higher for people identified as African-American.     . Total Bilirubin 10/02/2016 0.6  0.2 - 1.2 mg/dL Final  . Alkaline Phosphatase 10/02/2016 44  40 - 115 U/L Final  . AST 10/02/2016 15  10 - 35 U/L Final  . ALT 10/02/2016 19  9 - 46 U/L Final  . Total Protein 10/02/2016 6.1  6.1 - 8.1 g/dL Final  .  Albumin 10/02/2016 3.7  3.6 - 5.1 g/dL Final  . Calcium 10/02/2016 9.3  8.6 - 10.3 mg/dL Final  . GFR, Est African American 10/02/2016 80  >=60 mL/min Final  . GFR, Est Non African American 10/02/2016 69  >=60 mL/min Final  . Cholesterol 10/02/2016 113  <200 mg/dL Final  . Triglycerides 10/02/2016 77  <150 mg/dL Final  . HDL 10/02/2016 37* >40 mg/dL Final  . Total CHOL/HDL Ratio 10/02/2016 3.1  <5.0 Ratio Final  . VLDL 10/02/2016 15  <30 mg/dL Final  . LDL Cholesterol 10/02/2016 61  <100 mg/dL Final  . PSA 10/02/2016 2.8  <=4.0 ng/mL Final   Comment:   The total PSA value from this assay system is standardized against the WHO standard. The test result will be approximately 20% lower when compared to the  equimolar-standardized total PSA (Beckman Coulter). Comparison of serial PSA results should be interpreted with this fact in mind.   This test was performed using the Siemens chemiluminescent method. Values obtained from different assay methods cannot be used interchangeably. PSA levels, regardless of value, should not be interpreted as absolute evidence of the presence or absence of disease.       Past Medical History:  Diagnosis Date  . Atrial fibrillation with rapid ventricular response ([place]) 07/04/11   Xarelto started 4/13;    . CAD (coronary artery disease)    mild nonobstructive by cath '05 (ASTEROID trial - mLAD 30%, CFX 20-30%, OM 20%, mRCA 30%, normal LVF), EF 65% by echo '12;    . Complication of anesthesia    "bowels fall asleep & he can't have BM"  . GERD (gastroesophageal reflux disease) 07/04/11   "once in awhile; haven't been treated for it"  . HLD (hyperlipidemia)   . HTN (hypertension)   . OSA (obstructive sleep apnea)    "suppose to be wearing my mask"  . Renal cell cancer, right ([place]) 2000   s/p partial neprhectomy   . Renal cell carcinoma   . Skin cancer 06/2011   left ear  . Stroke [facility]) 2007   denies residual   Past Surgical History:  Procedure Laterality Date  . CARDIAC CATHETERIZATION  ~ 2007  . KNEE ARTHROSCOPY Left 1990's  . PARTIAL NEPHRECTOMY Right 2000   "took off  25%"  . THYROIDECTOMY, PARTIAL  ~ 2007  . TONSILLECTOMY AND ADENOIDECTOMY     "as a child"  . TOTAL KNEE ARTHROPLASTY Left 2010   Current Outpatient Prescriptions on File Prior to Visit  Medication Sig Dispense Refill  . atorvastatin (LIPITOR) 20 MG tablet Take 10 mg by mouth every other day.     . Cholecalciferol (VITAMIN D3) 1000 UNITS CAPS Take 2 capsules by mouth daily.     . Fish Oil OIL Take 800 mg by mouth daily.    . fluticasone (FLONASE) 50 MCG/ACT nasal spray PLACE 2 SPRAYS INTO THE NOSE EVERY EVENING. 16 g 3  . metoprolol succinate (TOPROL-XL) 25 MG 24 hr tablet Take  12.5 mg by mouth daily.    Marland Kitchen olmesartan-hydrochlorothiazide (BENICAR HCT) 40-25 MG tablet Take 1 tablet by mouth daily.     . tadalafil (CIALIS) 20 MG tablet Take 20 mg by mouth daily as needed for erectile dysfunction.    [name] 20 MG TABS tablet TAKE 1 TABLET BY MOUTH ONCE A DAY 90 tablet 1   No current facility-administered medications on file prior to visit.    Allergies  Allergen Reactions  . Contrast Media [Iodinated Diagnostic Agents]  Other (See Comments)    "Isovue"; "blisters; all down my legs"  . Metrizamide Other (See Comments)    "Isovue"; "blisters; all down my legs"   Social History   Social History  . Marital status: Married    Spouse name: N/A  . Number of children: N/A  . Years of education: N/A   Occupational History  . Not on file.   Social History Main Topics  . Smoking status: Former Smoker    Packs/day: 1.00    Years: 54.00    Types: Cigarettes    Quit date: 04/04/1977  . Smokeless tobacco: Former Systems developer    Types: Chew    Quit date: 10/04/2005  . Alcohol use Yes     Comment: rare  . Drug use: No  . Sexual activity: Yes     Comment: married happily to [place]   Other Topics Concern  . Not on file   Social History Narrative   Retired. Lives in [place]   Family History  Problem Relation Age of Onset  . Heart failure Mother        died at 32  . Diabetes Mother   . Kidney cancer Father   . Colon cancer Neg Hx   . Prostate cancer Neg Hx       Review of Systems  All other systems reviewed and are negative.      Objective:   Physical Exam  Constitutional: He is oriented to person, place, and time. He appears well-developed and well-nourished. No distress.  HENT:  Head: Normocephalic and atraumatic.  Right Ear: External ear normal.  Left Ear: External ear normal.  Nose: Nose normal.  Mouth/Throat: Oropharynx is clear and moist. No oropharyngeal exudate.  Eyes: Pupils are equal, round, and reactive to light. Conjunctivae and EOM are  normal. Right eye exhibits no discharge. Left eye exhibits no discharge. No scleral icterus.  Neck: Normal range of motion. Neck supple. No JVD present. No tracheal deviation present. No thyromegaly present.  Cardiovascular: Normal rate, regular rhythm, normal heart sounds and intact distal pulses.  Exam reveals no gallop and no friction rub.   No murmur heard. Pulmonary/Chest: Effort normal and breath sounds normal. No stridor. No respiratory distress. He has no wheezes. He has no rales. He exhibits no tenderness.  Abdominal: Soft. Bowel sounds are normal. He exhibits no distension and no mass. There is no tenderness. There is no rebound and no guarding.  Musculoskeletal: Normal range of motion. He exhibits no edema or tenderness.  Lymphadenopathy:    He has no cervical adenopathy.  Neurological: He is alert and oriented to person, place, and time. He has normal reflexes. No cranial nerve deficit. He exhibits normal muscle tone. Coordination normal.  Skin: Skin is warm. No rash noted. He is not diaphoretic. No erythema. No pallor.  Psychiatric: He has a normal mood and affect. His behavior is normal. Judgment and thought content normal.  Vitals reviewed.         Assessment & Plan:  Routine general medical examination at a health care facility  Benign essential HTN  Pure hypercholesterolemia  PAF (paroxysmal atrial fibrillation) (HCC)  Obstructive sleep apnea syndrome  Personal history of colonic polyps  History of stroke  Coronary artery disease involving native coronary artery of native heart without angina pectoris  Physical exam today is significant only for weight gain and prediabetes. His LDL cholesterol is slightly higher than his goal of 70 and his HDL cholesterol is slightly lower than 40. I  believe all these issues can be remedied by lifestyle changes. We spent a great deal of time discussing therapeutic lifestyle changes including a low carbohydrate diet, less than 45 g  of carbs per meal, tendon 15 pounds weight loss, and increasing aerobic exercise. I would like to see the patient back in 6 months to reassess his lab work and monitor his prediabetes. Meanwhile his cancer screening is up-to-date. PSA is excellent. Colonoscopy is due next year. Immunizations are up-to-date. Preventative care is up-to-date.

## 2016-10-10 ENCOUNTER — Other Ambulatory Visit: Payer: Self-pay | Admitting: Family Medicine

## 2016-11-17 DIAGNOSIS — Z85828 Personal history of other malignant neoplasm of skin: Secondary | ICD-10-CM | POA: Diagnosis not present

## 2016-11-17 DIAGNOSIS — D1801 Hemangioma of skin and subcutaneous tissue: Secondary | ICD-10-CM | POA: Diagnosis not present

## 2016-11-17 DIAGNOSIS — D235 Other benign neoplasm of skin of trunk: Secondary | ICD-10-CM | POA: Diagnosis not present

## 2016-11-17 DIAGNOSIS — D2362 Other benign neoplasm of skin of left upper limb, including shoulder: Secondary | ICD-10-CM | POA: Diagnosis not present

## 2016-11-17 DIAGNOSIS — L821 Other seborrheic keratosis: Secondary | ICD-10-CM | POA: Diagnosis not present

## 2016-11-17 DIAGNOSIS — L570 Actinic keratosis: Secondary | ICD-10-CM | POA: Diagnosis not present

## 2016-11-17 DIAGNOSIS — L814 Other melanin hyperpigmentation: Secondary | ICD-10-CM | POA: Diagnosis not present

## 2016-12-18 ENCOUNTER — Encounter: Payer: Self-pay | Admitting: Family Medicine

## 2016-12-18 ENCOUNTER — Ambulatory Visit (INDEPENDENT_AMBULATORY_CARE_PROVIDER_SITE_OTHER): Payer: Medicare HMO | Admitting: Family Medicine

## 2016-12-18 VITALS — BP 130/60 | HR 87 | Temp 97.6°F | Resp 18 | Ht 72.0 in | Wt 251.0 lb

## 2016-12-18 DIAGNOSIS — M5431 Sciatica, right side: Secondary | ICD-10-CM

## 2016-12-18 DIAGNOSIS — R69 Illness, unspecified: Secondary | ICD-10-CM | POA: Diagnosis not present

## 2016-12-18 MED ORDER — PREDNISONE 20 MG PO TABS: ORAL_TABLET | ORAL | 0 refills | Status: DC

## 2016-12-18 NOTE — Progress Notes (Signed)
Subjective:    Patient ID: Henry Martin, male    DOB: Sep 24, 1940, 76 y.o.   MRN: [id number]  HPI  02/2014 Patient is here today complaining of right-sided low back pain for 12 months. Pain is recently worsening. It radiates into his right posterior hip, down his right posterior thigh, and into his right foot. It is particularly worse when he is sitting for a prolonged period of time. He deals with the pain on a daily basis. He is unable to take NSAIDs due to his history of coronary artery disease and the fact that he has one functional kidney. He is afraid of ATN due to NSAID overuse.  At that time, my plan was: Patient has had symptoms of sciatica for 12 months. I will proceed with an MRI of the lumbar spine. Patient would be interested in epidural steroid injection. He is currently on Xarelto for atrial fibrillation. We will need to discontinue that medication approximately 1 week prior to his steroid injection. If the patient decides to move forward with this plan, I will need to contact his cardiologist for approval prior to scheduling the steroid injection.  12/18/2016 MRI at that time revealed degenerative changes in the subarticular space at L4/L5 possibly impinging on the right L4 nerve root. Patient's symptoms did not improve after physical therapy but gradually subsided over time.  However, his pain returned 4 months ago. He is again having severe low back pain just above his right gluteus which radiates down posterior right thigh into right shin. Past Medical History:  Diagnosis Date  . Atrial fibrillation with rapid ventricular response ([place]) 07/04/11   Xarelto started 4/13;    . CAD (coronary artery disease)    mild nonobstructive by cath '05 (ASTEROID trial - mLAD 30%, CFX 20-30%, OM 20%, mRCA 30%, normal LVF), EF 65% by echo '12;    . Complication of anesthesia    "bowels fall asleep & he can't have BM"  . GERD (gastroesophageal reflux disease) 07/04/11   "once in awhile; haven't  been treated for it"  . HLD (hyperlipidemia)   . HTN (hypertension)   . OSA (obstructive sleep apnea)    "suppose to be wearing my mask"  . Renal cell cancer, right ([place]) 2000   s/p partial neprhectomy   . Renal cell carcinoma   . Skin cancer 06/2011   left ear  . Stroke [facility]) 2007   denies residual   Past Surgical History:  Procedure Laterality Date  . CARDIAC CATHETERIZATION  ~ 2007  . KNEE ARTHROSCOPY Left 1990's  . PARTIAL NEPHRECTOMY Right 2000   "took off  25%"  . THYROIDECTOMY, PARTIAL  ~ 2007  . TONSILLECTOMY AND ADENOIDECTOMY     "as a child"  . TOTAL KNEE ARTHROPLASTY Left 2010   Current Outpatient Prescriptions on File Prior to Visit  Medication Sig Dispense Refill  . atorvastatin (LIPITOR) 20 MG tablet Take 10 mg by mouth every other day. patient taking 10mg  daily    . Cholecalciferol (VITAMIN D3) 1000 UNITS CAPS Take 1 capsule by mouth daily.     . Fish Oil OIL Take 800 mg by mouth daily.    . fluticasone (FLONASE) 50 MCG/ACT nasal spray PLACE 2 SPRAYS INTO THE NOSE EVERY EVENING. 16 g 3  . metoprolol succinate (TOPROL-XL) 25 MG 24 hr tablet Take 12.5 mg by mouth daily.    Marland Kitchen olmesartan-hydrochlorothiazide (BENICAR HCT) 40-25 MG tablet Take 1 tablet by mouth daily.     . tadalafil (  CIALIS) 20 MG tablet Take 1 tablet (20 mg total) by mouth daily as needed for erectile dysfunction. 10 tablet 11  . XARELTO 20 MG TABS tablet TAKE 1 TABLET BY MOUTH ONCE A DAY 90 tablet 1   No current facility-administered medications on file prior to visit.    Allergies  Allergen Reactions  . Contrast Media [Iodinated Diagnostic Agents] Other (See Comments)    "Isovue"; "blisters; all down my legs"  . Metrizamide Other (See Comments)    "Isovue"; "blisters; all down my legs"   Social History   Social History  . Marital status: Married    Spouse name: N/A  . Number of children: N/A  . Years of education: N/A   Occupational History  . Not on file.   Social History Main  Topics  . Smoking status: Former Smoker    Packs/day: 1.00    Years: 54.00    Types: Cigarettes    Quit date: 04/04/1977  . Smokeless tobacco: Former Systems developer    Types: Chew    Quit date: 10/04/2005  . Alcohol use Yes     Comment: rare  . Drug use: No  . Sexual activity: Yes     Comment: married happily to [place]   Other Topics Concern  . Not on file   Social History Narrative   Retired. Lives in [place]      Review of Systems  Musculoskeletal: Positive for back pain.  All other systems reviewed and are negative.      Objective:   Physical Exam  Constitutional: He is oriented to person, place, and time.  Cardiovascular: Normal rate, regular rhythm and normal heart sounds.   Pulmonary/Chest: Effort normal and breath sounds normal.  Musculoskeletal: Normal range of motion. He exhibits tenderness. He exhibits no edema.  Neurological: He is oriented to person, place, and time. He has normal reflexes. No cranial nerve deficit. He exhibits normal muscle tone. Coordination normal.  Vitals reviewed.         Assessment & Plan:  Right sided sciatica - Plan: predniSONE (DELTASONE) 20 MG tablet, MR Lumbar Spine Wo Contrast he has tried waiting > 6 weeks, tylenol, and PT in the past without benefit.  Will try prednisone taper pack and proceed with repeat MRI of L spine and plan for epidural steroid injection if no better.  Will need to stop xarelto prior to [facility].

## 2016-12-25 ENCOUNTER — Other Ambulatory Visit: Payer: Self-pay | Admitting: Family Medicine

## 2016-12-25 DIAGNOSIS — M5431 Sciatica, right side: Secondary | ICD-10-CM

## 2016-12-27 DIAGNOSIS — M5416 Radiculopathy, lumbar region: Secondary | ICD-10-CM | POA: Diagnosis not present

## 2016-12-31 ENCOUNTER — Other Ambulatory Visit: Payer: Medicare HMO

## 2017-01-16 ENCOUNTER — Telehealth: Payer: Self-pay | Admitting: Family Medicine

## 2017-01-16 ENCOUNTER — Other Ambulatory Visit: Payer: Self-pay | Admitting: Family Medicine

## 2017-01-16 MED ORDER — ATORVASTATIN CALCIUM 20 MG PO TABS: 10.0000 mg | ORAL_TABLET | ORAL | 1 refills | Status: DC

## 2017-01-16 MED ORDER — OLMESARTAN MEDOXOMIL-HCTZ 40-25 MG PO TABS
1.0000 | ORAL_TABLET | Freq: Every day | ORAL | 3 refills | Status: DC
Start: 2017-01-16 — End: 2017-01-16

## 2017-01-16 NOTE — Telephone Encounter (Signed)
Medication called/sent to requested pharmacy  

## 2017-01-16 NOTE — Telephone Encounter (Signed)
Patient calling to say [facility] is needing new prescription for his atorvastin and metoprolol if possible  There phone number is [phone]

## 2017-01-17 MED ORDER — ATORVASTATIN CALCIUM 10 MG PO TABS
10.0000 mg | ORAL_TABLET | Freq: Every day | ORAL | 3 refills | Status: DC
Start: 2017-01-17 — End: 2018-06-05

## 2017-01-17 NOTE — Addendum Note (Signed)
Addended by: Shary Decamp B on: 01/17/2017 01:13 PM   Modules accepted: Orders

## 2017-01-17 NOTE — Addendum Note (Signed)
Addended by: Shary Decamp B on: 01/17/2017 02:57 PM   Modules accepted: Orders

## 2017-01-18 DIAGNOSIS — M5136 Other intervertebral disc degeneration, lumbar region: Secondary | ICD-10-CM | POA: Diagnosis not present

## 2017-01-18 DIAGNOSIS — M5416 Radiculopathy, lumbar region: Secondary | ICD-10-CM | POA: Diagnosis not present

## 2017-01-29 ENCOUNTER — Other Ambulatory Visit: Payer: Self-pay | Admitting: Family Medicine

## 2017-01-29 MED ORDER — METOPROLOL SUCCINATE ER 25 MG PO TB24: 12.5000 mg | ORAL_TABLET | Freq: Every day | ORAL | 1 refills | Status: DC

## 2017-03-07 ENCOUNTER — Other Ambulatory Visit: Payer: Self-pay | Admitting: Family Medicine

## 2017-05-22 DIAGNOSIS — M5416 Radiculopathy, lumbar region: Secondary | ICD-10-CM | POA: Diagnosis not present

## 2017-06-07 DIAGNOSIS — M5416 Radiculopathy, lumbar region: Secondary | ICD-10-CM | POA: Diagnosis not present

## 2017-06-18 DIAGNOSIS — H2513 Age-related nuclear cataract, bilateral: Secondary | ICD-10-CM | POA: Diagnosis not present

## 2017-06-18 DIAGNOSIS — H5203 Hypermetropia, bilateral: Secondary | ICD-10-CM | POA: Diagnosis not present

## 2017-07-02 DIAGNOSIS — M1711 Unilateral primary osteoarthritis, right knee: Secondary | ICD-10-CM | POA: Diagnosis not present

## 2017-07-02 DIAGNOSIS — M5416 Radiculopathy, lumbar region: Secondary | ICD-10-CM | POA: Diagnosis not present

## 2017-08-01 DIAGNOSIS — M5416 Radiculopathy, lumbar region: Secondary | ICD-10-CM | POA: Diagnosis not present

## 2017-08-08 DIAGNOSIS — M5416 Radiculopathy, lumbar region: Secondary | ICD-10-CM | POA: Diagnosis not present

## 2017-08-14 DIAGNOSIS — M5416 Radiculopathy, lumbar region: Secondary | ICD-10-CM | POA: Diagnosis not present

## 2017-08-16 DIAGNOSIS — Z809 Family history of malignant neoplasm, unspecified: Secondary | ICD-10-CM | POA: Diagnosis not present

## 2017-08-16 DIAGNOSIS — Z7901 Long term (current) use of anticoagulants: Secondary | ICD-10-CM | POA: Diagnosis not present

## 2017-08-16 DIAGNOSIS — Z6833 Body mass index (BMI) 33.0-33.9, adult: Secondary | ICD-10-CM | POA: Diagnosis not present

## 2017-08-16 DIAGNOSIS — E785 Hyperlipidemia, unspecified: Secondary | ICD-10-CM | POA: Diagnosis not present

## 2017-08-16 DIAGNOSIS — Z833 Family history of diabetes mellitus: Secondary | ICD-10-CM | POA: Diagnosis not present

## 2017-08-16 DIAGNOSIS — I4891 Unspecified atrial fibrillation: Secondary | ICD-10-CM | POA: Diagnosis not present

## 2017-08-16 DIAGNOSIS — I10 Essential (primary) hypertension: Secondary | ICD-10-CM | POA: Diagnosis not present

## 2017-08-16 DIAGNOSIS — E669 Obesity, unspecified: Secondary | ICD-10-CM | POA: Diagnosis not present

## 2017-08-16 DIAGNOSIS — G8929 Other chronic pain: Secondary | ICD-10-CM | POA: Diagnosis not present

## 2017-08-16 DIAGNOSIS — Z8249 Family history of ischemic heart disease and other diseases of the circulatory system: Secondary | ICD-10-CM | POA: Diagnosis not present

## 2017-09-17 ENCOUNTER — Other Ambulatory Visit: Payer: Self-pay | Admitting: Family Medicine

## 2017-09-27 DIAGNOSIS — M1711 Unilateral primary osteoarthritis, right knee: Secondary | ICD-10-CM | POA: Diagnosis not present

## 2017-10-01 DIAGNOSIS — L821 Other seborrheic keratosis: Secondary | ICD-10-CM | POA: Diagnosis not present

## 2017-10-01 DIAGNOSIS — D2362 Other benign neoplasm of skin of left upper limb, including shoulder: Secondary | ICD-10-CM | POA: Diagnosis not present

## 2017-10-01 DIAGNOSIS — C4442 Squamous cell carcinoma of skin of scalp and neck: Secondary | ICD-10-CM | POA: Diagnosis not present

## 2017-10-01 DIAGNOSIS — Z85828 Personal history of other malignant neoplasm of skin: Secondary | ICD-10-CM | POA: Diagnosis not present

## 2017-10-01 DIAGNOSIS — L814 Other melanin hyperpigmentation: Secondary | ICD-10-CM | POA: Diagnosis not present

## 2017-10-01 DIAGNOSIS — D1801 Hemangioma of skin and subcutaneous tissue: Secondary | ICD-10-CM | POA: Diagnosis not present

## 2017-10-02 ENCOUNTER — Encounter: Payer: Self-pay | Admitting: *Deleted

## 2017-10-02 NOTE — Telephone Encounter (Signed)
ERROR    This encounter was created in error - please disregard.

## 2017-10-11 ENCOUNTER — Encounter: Payer: Self-pay | Admitting: Family Medicine

## 2017-10-11 ENCOUNTER — Ambulatory Visit: Payer: Self-pay | Admitting: Family Medicine

## 2017-10-11 ENCOUNTER — Ambulatory Visit (INDEPENDENT_AMBULATORY_CARE_PROVIDER_SITE_OTHER): Payer: Medicare HMO | Admitting: Family Medicine

## 2017-10-11 VITALS — BP 164/84 | HR 82 | Temp 98.0°F | Resp 18 | Ht 72.0 in | Wt 252.0 lb

## 2017-10-11 DIAGNOSIS — E7800 Pure hypercholesterolemia, unspecified: Secondary | ICD-10-CM | POA: Diagnosis not present

## 2017-10-11 DIAGNOSIS — I480 Paroxysmal atrial fibrillation: Secondary | ICD-10-CM

## 2017-10-11 DIAGNOSIS — I10 Essential (primary) hypertension: Secondary | ICD-10-CM | POA: Diagnosis not present

## 2017-10-11 DIAGNOSIS — I2510 Atherosclerotic heart disease of native coronary artery without angina pectoris: Secondary | ICD-10-CM

## 2017-10-11 DIAGNOSIS — Z125 Encounter for screening for malignant neoplasm of prostate: Secondary | ICD-10-CM | POA: Diagnosis not present

## 2017-10-11 DIAGNOSIS — Z7901 Long term (current) use of anticoagulants: Secondary | ICD-10-CM | POA: Diagnosis not present

## 2017-10-11 NOTE — Progress Notes (Signed)
HPI: FU atrial fibrillation. History of nonobstructive CAD by cardiac catheterization in 2005 (ASTEROID trial). He was admitted 4/13 with AFib with RVR in the setting of viral gastroenteritis. Last echocardiogram July 2018 showed normal LV systolic function, grade 1 diastolic dysfunction, mild mitral regurgitation, mild aortic insufficiency, ascending aorta measured at 37 mm and aortic root at 40 mm.  Abdominal ultrasound July 2018 showed no aneurysm.  Since last seen,  patient denies dyspnea, chest pain, palpitations or syncope.  He can climb 3 flights of stairs and walk 1 mile with no symptoms.  Current Outpatient Medications  Medication Sig Dispense Refill  . atorvastatin (LIPITOR) 10 MG tablet Take 1 tablet (10 mg total) daily by mouth. 90 tablet 3  . Cholecalciferol (VITAMIN D3) 1000 UNITS CAPS Take 1 capsule by mouth daily.     . Fish Oil OIL Take 800 mg by mouth daily.    . fluticasone (FLONASE) 50 MCG/ACT nasal spray PLACE 2 SPRAYS INTO THE NOSE EVERY EVENING. 16 g 3  . metoprolol succinate (TOPROL-XL) 25 MG 24 hr tablet Take 0.5 tablets (12.5 mg total) by mouth daily. 90 tablet 1  . olmesartan-hydrochlorothiazide (BENICAR HCT) 40-25 MG tablet TAKE 1 TABLET BY MOUTH ONCE A DAY 90 tablet 1  . tadalafil (CIALIS) 20 MG tablet Take 1 tablet (20 mg total) by mouth daily as needed for erectile dysfunction. 10 tablet 11  . XARELTO 20 MG TABS tablet TAKE 1 TABLET BY MOUTH ONCE A DAY 90 tablet 3   No current facility-administered medications for this visit.      Past Medical History:  Diagnosis Date  . Atrial fibrillation with rapid ventricular response ([place]) 07/04/11   Xarelto started 4/13;    . CAD (coronary artery disease)    mild nonobstructive by cath '05 (ASTEROID trial - mLAD 30%, CFX 20-30%, OM 20%, mRCA 30%, normal LVF), EF 65% by echo '12;    . Complication of anesthesia    "bowels fall asleep & he can't have BM"  . GERD (gastroesophageal reflux disease) 07/04/11   "once in  awhile; haven't been treated for it"  . HLD (hyperlipidemia)   . HTN (hypertension)   . OSA (obstructive sleep apnea)    "suppose to be wearing my mask"  . Renal cell cancer, right ([place]) 2000   s/p partial neprhectomy   . Renal cell carcinoma   . Skin cancer 06/2011   left ear  . Stroke [facility]) 2007   denies residual    Past Surgical History:  Procedure Laterality Date  . CARDIAC CATHETERIZATION  ~ 2007  . KNEE ARTHROSCOPY Left 1990's  . PARTIAL NEPHRECTOMY Right 2000   "took off  25%"  . THYROIDECTOMY, PARTIAL  ~ 2007  . TONSILLECTOMY AND ADENOIDECTOMY     "as a child"  . TOTAL KNEE ARTHROPLASTY Left 2010    Social History   Socioeconomic History  . Marital status: Married    Spouse name: Not on file  . Number of children: Not on file  . Years of education: Not on file  . Highest education level: Not on file  Occupational History  . Not on file  Social Needs  . Financial resource strain: Not on file  . Food insecurity:    Worry: Not on file    Inability: Not on file  . Transportation needs:    Medical: Not on file    Non-medical: Not on file  Tobacco Use  . Smoking status: Former Smoker  Packs/day: 1.00    Years: 54.00    Pack years: 54.00    Types: Cigarettes    Last attempt to quit: 04/04/1977    Years since quitting: 40.5  . Smokeless tobacco: Former Systems developer    Types: [place] date: 10/04/2005  Substance and Sexual Activity  . Alcohol use: Yes    Comment: rare  . Drug use: No  . Sexual activity: Yes    Comment: married happily to [place]  . Physical activity:    Days per week: Not on file    Minutes per session: Not on file  . Stress: Not on file  Relationships  . Social connections:    Talks on phone: Not on file    Gets together: Not on file    Attends religious service: Not on file    Active member of club or organization: Not on file    Attends meetings of clubs or organizations: Not on file    Relationship status: Not on file  .  Intimate partner violence:    Fear of current or ex partner: Not on file    Emotionally abused: Not on file    Physically abused: Not on file    Forced sexual activity: Not on file  Other Topics Concern  . Not on file  Social History Narrative   Retired. Lives in [place]    Family History  Problem Relation Age of Onset  . Heart failure Mother        died at 39  . Diabetes Mother   . Kidney cancer Father   . Colon cancer Neg Hx   . Prostate cancer Neg Hx     ROS: Knee arthralgias but no fevers or chills, productive cough, hemoptysis, dysphasia, odynophagia, melena, hematochezia, dysuria, hematuria, rash, seizure activity, orthopnea, PND, pedal edema, claudication. Remaining systems are negative.  Physical Exam: Well-developed well-nourished in no acute distress.  Skin is warm and dry.  HEENT is normal.  Neck is supple.  Chest is clear to auscultation with normal expansion.  Cardiovascular exam is regular rate and rhythm.  Abdominal exam nontender or distended. No masses palpated. Extremities show trace edema. neuro grossly intact  ECG-sinus bradycardia at a rate of 59, left anterior fascicular block, right bundle branch block.  Left ventricular hypertrophy.  Personally reviewed  A/P  1 paroxysmal atrial fibrillation-patient is in sinus rhythm today.  We will continue beta-blockade for rate control if atrial fibrillation recurs.  Continue Xarelto.  Check hemoglobin and renal function.  2 hypertension-blood pressure is controlled.  Continue present medications.  3 coronary artery disease-plan to continue statin.  Patient is not on aspirin given need for anticoagulation.  4 hyperlipidemia-continue statin.  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  Will repeat study to reassess.  6 preoperative evaluation prior to partial knee replacement-patient will need surgery in September.  He has reasonable functional capacity able to climb 3 flights of stairs and walk  1 mile with no chest pain or dyspnea.  He may proceed without further ischemia evaluation.  Hold Xarelto 2 days prior to procedure and resume after when hemostasis achieved.  Kirk Ruths, MD

## 2017-10-11 NOTE — Progress Notes (Signed)
Subjective:    Patient ID: Henry Martin, male    DOB: [birth year], 77 y.o.   MRN: [id number]  HPI  Patient presents for preoperative surgical clearance.  He is planned to have a right knee replacement.  From a cardiovascular standpoint, the patient has a history of coronary artery disease as well as atrial fibrillation that is paroxysmal in nature.  Today on his exam, he is actually in normal sinus rhythm.  He is currently anticoagulated with Xarelto.  He is aware that he will need to discontinue Xarelto 48 hours prior to surgery and he is also willing to accept the slight increased risk of stroke off the anticoagulant until after surgery.  He denies any chest pain.  He denies any shortness of breath.  He denies any dyspnea on exertion.  He has been battling low back pain recently.  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  Between L5-S1 there was moderate to severe subarticular stenosis bilaterally.  He has had several epidural steroid injections in the back pain is currently better control but still present.  He denies any neuropathy in his legs at the present time although he does have severe pain in his right knee.  His blood pressure today is extremely high.  After having the patient sit and relax for approximately 30 minutes, I rechecked his blood pressure and found it to be 146/80.  I have asked the patient to check his blood pressure frequently at home over the next month and present the values to me for my review.  He is overdue for lab work.  He would like to go ahead and get all his labs necessary for his physical exam performed at the same time including a PSA as he is no longer seeing his urologist as he has been released.  His next colonoscopy is due later this year in November however he would like to put this off at the present time as his wife is going to have to have heart surgery to repair aortic  stenosis.  That coupled with his upcoming right knee replacement is obviously first and foremost on his mind and he would prefer to defer the colonoscopy until after this is been resolved.  Past Medical History:  Diagnosis Date  . Atrial fibrillation with rapid ventricular response ([place]) 07/04/11   Xarelto started 4/13;    . CAD (coronary artery disease)    mild nonobstructive by cath '05 (ASTEROID trial - mLAD 30%, CFX 20-30%, OM 20%, mRCA 30%, normal LVF), EF 65% by echo '12;    . Complication of anesthesia    "bowels fall asleep & he can't have BM"  . GERD (gastroesophageal reflux disease) 07/04/11   "once in awhile; haven't been treated for it"  . HLD (hyperlipidemia)   . HTN (hypertension)   . OSA (obstructive sleep apnea)    "suppose to be wearing my mask"  . Renal cell cancer, right ([place]) 2000   s/p partial neprhectomy   . Renal cell carcinoma   . Skin cancer 06/2011   left ear  . Stroke [facility]) 2007   denies residual   Past Surgical History:  Procedure Laterality Date  . CARDIAC CATHETERIZATION  ~ 2007  . KNEE ARTHROSCOPY Left 1990's  . PARTIAL NEPHRECTOMY Right 2000   "took off  25%"  . THYROIDECTOMY, PARTIAL  ~ 2007  . TONSILLECTOMY AND ADENOIDECTOMY     "as  a child"  . TOTAL KNEE ARTHROPLASTY Left 2010   Current Outpatient Medications on File Prior to Visit  Medication Sig Dispense Refill  . atorvastatin (LIPITOR) 10 MG tablet Take 1 tablet (10 mg total) daily by mouth. 90 tablet 3  . Cholecalciferol (VITAMIN D3) 1000 UNITS CAPS Take 1 capsule by mouth daily.     . Fish Oil OIL Take 800 mg by mouth daily.    . fluticasone (FLONASE) 50 MCG/ACT nasal spray PLACE 2 SPRAYS INTO THE NOSE EVERY EVENING. 16 g 3  . metoprolol succinate (TOPROL-XL) 25 MG 24 hr tablet Take 0.5 tablets (12.5 mg total) by mouth daily. 90 tablet 1  . olmesartan-hydrochlorothiazide (BENICAR HCT) 40-25 MG tablet TAKE 1 TABLET BY MOUTH ONCE A DAY 90 tablet 1  . tadalafil (CIALIS) 20 MG tablet Take 1  tablet (20 mg total) by mouth daily as needed for erectile dysfunction. 10 tablet 11  . XARELTO 20 MG TABS tablet TAKE 1 TABLET BY MOUTH ONCE A DAY 90 tablet 3   No current facility-administered medications on file prior to visit.    Allergies  Allergen Reactions  . Contrast Media [Iodinated Diagnostic Agents] Other (See Comments)    "Isovue"; "blisters; all down my legs"  . Metrizamide Other (See Comments)    "Isovue"; "blisters; all down my legs"   Social History   Socioeconomic History  . Marital status: Married    Spouse name: Not on file  . Number of children: Not on file  . Years of education: Not on file  . Highest education level: Not on file  Occupational History  . Not on file  Social Needs  . Financial resource strain: Not on file  . Food insecurity:    Worry: Not on file    Inability: Not on file  . Transportation needs:    Medical: Not on file    Non-medical: Not on file  Tobacco Use  . Smoking status: Former Smoker    Packs/day: 1.00    Years: 54.00    Pack years: 54.00    Types: Cigarettes    Last attempt to quit: 04/04/1977    Years since quitting: 40.5  . Smokeless tobacco: Former Systems developer    Types: [place] date: 10/04/2005  Substance and Sexual Activity  . Alcohol use: Yes    Comment: rare  . Drug use: No  . Sexual activity: Yes    Comment: married happily to [place]  . Physical activity:    Days per week: Not on file    Minutes per session: Not on file  . Stress: Not on file  Relationships  . Social connections:    Talks on phone: Not on file    Gets together: Not on file    Attends religious service: Not on file    Active member of club or organization: Not on file    Attends meetings of clubs or organizations: Not on file    Relationship status: Not on file  . Intimate partner violence:    Fear of current or ex partner: Not on file    Emotionally abused: Not on file    Physically abused: Not on file    Forced sexual activity:  Not on file  Other Topics Concern  . Not on file  Social History Narrative   Retired. Lives in [place]   Family History  Problem Relation Age of Onset  . Heart failure Mother  died at 53  . Diabetes Mother   . Kidney cancer Father   . Colon cancer Neg Hx   . Prostate cancer Neg Hx       Review of Systems  All other systems reviewed and are negative.      Objective:   Physical Exam  Constitutional: He is oriented to person, place, and time. He appears well-developed and well-nourished. No distress.  HENT:  Head: Normocephalic and atraumatic.  Right Ear: External ear normal.  Left Ear: External ear normal.  Nose: Nose normal.  Mouth/Throat: Oropharynx is clear and moist. No oropharyngeal exudate.  Eyes: Pupils are equal, round, and reactive to light. Conjunctivae and EOM are normal. Right eye exhibits no discharge. Left eye exhibits no discharge. No scleral icterus.  Neck: Normal range of motion. Neck supple. No JVD present. No tracheal deviation present. No thyromegaly present.  Cardiovascular: Normal rate, regular rhythm, normal heart sounds and intact distal pulses. Exam reveals no gallop and no friction rub.  No murmur heard. Pulmonary/Chest: Effort normal and breath sounds normal. No stridor. No respiratory distress. He has no wheezes. He has no rales. He exhibits no tenderness.  Abdominal: Soft. Bowel sounds are normal. He exhibits no distension and no mass. There is no tenderness. There is no rebound and no guarding.  Musculoskeletal: Normal range of motion. He exhibits no edema or tenderness.  Lymphadenopathy:    He has no cervical adenopathy.  Neurological: He is alert and oriented to person, place, and time. He has normal reflexes. No cranial nerve deficit. He exhibits normal muscle tone. Coordination normal.  Skin: Skin is warm. No rash noted. He is not diaphoretic. No erythema. No pallor.  Psychiatric: He has a normal mood and affect. His behavior is  normal. Judgment and thought content normal.  Vitals reviewed.         Assessment & Plan:  Benign essential HTN - Plan: CBC with Differential/Platelet, COMPLETE METABOLIC PANEL WITH GFR, Lipid panel  PAF (paroxysmal atrial fibrillation) (HCC)  Pure hypercholesterolemia - Plan: CBC with Differential/Platelet, COMPLETE METABOLIC PANEL WITH GFR, Lipid panel  Coronary artery disease involving native coronary artery of native heart without angina pectoris - Plan: CBC with Differential/Platelet, COMPLETE METABOLIC PANEL WITH GFR, Lipid panel  Chronic anticoagulation  Prostate cancer screening - Plan: PSA  Patient's physical exam today is normal outside of some edema in his legs.  He denies any angina.  He denies any orthopnea.  There is no evidence of congestive heart failure today on his exam.  I will obtain a CBC to evaluate for anemia as well as a CMP to monitor his renal function and liver function test.  Assuming that his lab work is within normal limits, he is medically cleared to proceed with his upcoming knee replacement.  He has an appointment later to see his cardiologist for cardiovascular clearance.  While obtaining fasting lab work we will also check a PSA.  Immunizations are up-to-date at the present time I have recommended a flu shot later this fall.  He defers his colonoscopy at the present time.  I will also check a fasting lipid panel.  My only issue is his elevated blood pressure which some of which seems to be due to anxiety this morning and stress.  He will check his blood pressure 2-3 times a day for the next week to report the values to me.  If greater than 140/90, we will titrate his medication.

## 2017-10-15 ENCOUNTER — Ambulatory Visit: Payer: Medicare HMO | Admitting: Cardiology

## 2017-10-15 ENCOUNTER — Encounter: Payer: Self-pay | Admitting: Cardiology

## 2017-10-15 ENCOUNTER — Other Ambulatory Visit: Payer: Medicare HMO

## 2017-10-15 VITALS — BP 140/70 | HR 59 | Ht 72.0 in | Wt 250.0 lb

## 2017-10-15 DIAGNOSIS — Z01810 Encounter for preprocedural cardiovascular examination: Secondary | ICD-10-CM | POA: Diagnosis not present

## 2017-10-15 DIAGNOSIS — I77810 Thoracic aortic ectasia: Secondary | ICD-10-CM

## 2017-10-15 DIAGNOSIS — I480 Paroxysmal atrial fibrillation: Secondary | ICD-10-CM

## 2017-10-15 DIAGNOSIS — I2510 Atherosclerotic heart disease of native coronary artery without angina pectoris: Secondary | ICD-10-CM | POA: Diagnosis not present

## 2017-10-15 DIAGNOSIS — E7800 Pure hypercholesterolemia, unspecified: Secondary | ICD-10-CM | POA: Diagnosis not present

## 2017-10-15 DIAGNOSIS — R7309 Other abnormal glucose: Secondary | ICD-10-CM | POA: Diagnosis not present

## 2017-10-15 DIAGNOSIS — I10 Essential (primary) hypertension: Secondary | ICD-10-CM

## 2017-10-15 DIAGNOSIS — Z125 Encounter for screening for malignant neoplasm of prostate: Secondary | ICD-10-CM | POA: Diagnosis not present

## 2017-10-15 NOTE — Patient Instructions (Signed)
Medication Instructions:   HOLD XARELTO 2 DAYS PRIOR TO SURGERY AND RESTART WHEN OKAY WITH THE SURGEON  Testing/Procedures:  Your physician has requested that you have an echocardiogram. Echocardiography is a painless test that uses sound waves to create images of your heart. It provides your doctor with information about the size and shape of your heart and how well your heart's chambers and valves are working. This procedure takes approximately one hour. There are no restrictions for this procedure.    Follow-Up:  Your physician wants you to follow-up in: [place] will receive a reminder letter in the mail two months in advance. If you don't receive a letter, please call our office to schedule the follow-up appointment.   If you need a refill on your cardiac medications before your next appointment, please call your pharmacy.

## 2017-10-16 ENCOUNTER — Other Ambulatory Visit: Payer: Self-pay | Admitting: Orthopedic Surgery

## 2017-10-16 NOTE — Care Plan (Signed)
R [facility] scheduled on 11-14-17 DCP:  Home with spouse.  1 story home with 3 ste. DME:  No needs.  Has a RW and 3-in-1. PT:  EO.  PT eval scheduled on 11-16-17.

## 2017-10-16 NOTE — H&P (Addendum)
UNICOMPARTMENTAL KNEE ARTHROPLASTY ADMISSION H&P  Patient is being admitted for right knee medial unicompartmental arthroplasty.  Subjective:  Chief Complaint:right knee pain.  HPI: Henry Martin, 77 y.o. male, has a history of pain and functional disability in the right knee due to arthritis and has failed non-surgical conservative treatments for greater than 12 weeks to includecorticosteriod injections and activity modification.  Onset of symptoms was gradual, starting >10 years ago with gradually worsening course since that time. The patient noted no past surgery on the right knee(s).  Patient currently rates pain in the right knee(s) at 9 out of 10 with activity. Patient has worsening of pain with activity and weight bearing and pain that interferes with activities of daily living.  Patient has evidence of isolated bone-on-bone change in the medial compartment with no lateral or patellofemoral involvement by imaging studies. There is no active infection.  Patient Active Problem List   Diagnosis Date Noted  . Chronic anticoagulation 02/01/2016  . History of stroke 02/01/2016  . Sleep apnea 02/01/2016  . Personal history of colonic polyps 12/26/2012  . Diarrhea 07/05/2011  . PAF (paroxysmal atrial fibrillation) ([place]) 07/04/2011  . HLD (hyperlipidemia) 03/23/2008  . Essential hypertension 03/23/2008  . CAD (coronary artery disease) 03/23/2008   Past Medical History:  Diagnosis Date  . Atrial fibrillation with rapid ventricular response ([place]) 07/04/11   Xarelto started 4/13;    . CAD (coronary artery disease)    mild nonobstructive by cath '05 (ASTEROID trial - mLAD 30%, CFX 20-30%, OM 20%, mRCA 30%, normal LVF), EF 65% by echo '12;    . Complication of anesthesia    "bowels fall asleep & he can't have BM"  . GERD (gastroesophageal reflux disease) 07/04/11   "once in awhile; haven't been treated for it"  . HLD (hyperlipidemia)   . HTN (hypertension)   . OSA (obstructive sleep apnea)     "suppose to be wearing my mask"  . Renal cell cancer, right ([place]) 2000   s/p partial neprhectomy   . Renal cell carcinoma   . Skin cancer 06/2011   left ear  . Stroke [facility]) 2007   denies residual    Past Surgical History:  Procedure Laterality Date  . CARDIAC CATHETERIZATION  ~ 2007  . KNEE ARTHROSCOPY Left 1990's  . PARTIAL NEPHRECTOMY Right 2000   "took off  25%"  . THYROIDECTOMY, PARTIAL  ~ 2007  . TONSILLECTOMY AND ADENOIDECTOMY     "as a child"  . TOTAL KNEE ARTHROPLASTY Left 2010    No current facility-administered medications for this encounter.    Current Outpatient Medications  Medication Sig Dispense Refill Last Dose  . atorvastatin (LIPITOR) 10 MG tablet Take 1 tablet (10 mg total) daily by mouth. 90 tablet 3 Taking  . Cholecalciferol (VITAMIN D3) 1000 UNITS CAPS Take 1 capsule by mouth daily.    Taking  . Fish Oil OIL Take 800 mg by mouth daily.   Taking  . fluticasone (FLONASE) 50 MCG/ACT nasal spray PLACE 2 SPRAYS INTO THE NOSE EVERY EVENING. 16 g 3 Taking  . metoprolol succinate (TOPROL-XL) 25 MG 24 hr tablet Take 0.5 tablets (12.5 mg total) by mouth daily. 90 tablet 1 Taking  . olmesartan-hydrochlorothiazide (BENICAR HCT) 40-25 MG tablet TAKE 1 TABLET BY MOUTH ONCE A DAY 90 tablet 1 Taking  . tadalafil (CIALIS) 20 MG tablet Take 1 tablet (20 mg total) by mouth daily as needed for erectile dysfunction. 10 tablet 11 Taking  . XARELTO 20 MG TABS  tablet TAKE 1 TABLET BY MOUTH ONCE A DAY 90 tablet 3 Taking   Allergies  Allergen Reactions  . Contrast Media [Iodinated Diagnostic Agents] Other (See Comments)    "Isovue"; "blisters; all down my legs"  . Metrizamide Other (See Comments)    "Isovue"; "blisters; all down my legs"    Social History   Tobacco Use  . Smoking status: Former Smoker    Packs/day: 1.00    Years: 54.00    Pack years: 54.00    Types: Cigarettes    Last attempt to quit: 04/04/1977    Years since quitting: 40.5  . Smokeless tobacco: Former  Systems developer    Types: [place] date: 10/04/2005  Substance Use Topics  . Alcohol use: Yes    Comment: rare    Family History  Problem Relation Age of Onset  . Heart failure Mother        died at 28  . Diabetes Mother   . Kidney cancer Father   . Colon cancer Neg Hx   . Prostate cancer Neg Hx      Review of Systems  Constitutional: Negative for chills and fever.  HENT: Negative for congestion, sore throat and tinnitus.   Eyes: Negative for double vision, photophobia and pain.  Respiratory: Negative for cough, shortness of breath and wheezing.   Cardiovascular: Negative for chest pain, palpitations and orthopnea.  Gastrointestinal: Negative for heartburn, nausea and vomiting.  Genitourinary: Negative for dysuria, frequency and urgency.  Musculoskeletal: Positive for joint pain.  Neurological: Negative for dizziness, weakness and headaches.  Psychiatric/Behavioral: Negative for depression.    Objective:  Physical Exam  Well nourished and well developed. General: Alert and oriented x3, cooperative and pleasant, no acute distress. Head: normocephalic, atraumatic, neck supple. Eyes: EOMI. Respiratory: breath sounds clear in all fields, no wheezing, rales, or rhonchi. Cardiovascular: Regular rate and rhythm, no murmurs, gallops or rubs.  Abdomen: non-tender to palpation and soft, normoactive bowel sounds. Musculoskeletal: Slightly antalgic gait pattern favoring the right side without the use of assisted devices. Right Hip Exam: ROM: Normal without discomfort. There is no tenderness over the greater trochanter bursa. There is no pain on provocative testing of the hip. Right Knee Exam: No effusion. No Swelling.Slight varus deformity.Range of motion is 0-125 degrees. No crepitus on range of motion of the knee. Positive medial joint line tendernessNo lateral joint line tenderness. Stable knee. Left Hip Exam:ROM: Normal without discomfort. There is no tenderness over the greater trochanter  bursa. There is no pain on provocative testing of the hip. Left Knee Exam: No effusion. No Swelling.Range of motion is 0-125 degrees. No crepitus on range of motion of the knee. No medial joint line tenderness. No lateral joint line tenderness. Stable knee. Calves soft and nontender. Motor function intact in LE. Strength 5/5 LE bilaterally. Neuro: Distal pulses 2+. Sensation to light touch intact in LE.  Vital signs in last 24 hours: Blood pressure: 148/76 mmHg Pulse: 68 bpm  Labs:   Estimated body mass index is 33.91 kg/m as calculated from the following:   Height as of 10/15/17: 6' (1.829 m).   Weight as of 10/15/17: 113.4 kg.   Imaging Review Plain radiographs demonstrate severe degenerative joint disease isolated to the medial compartment of the right knee(s). The overall alignment isneutral. The bone quality appears to be adequate for age and reported activity level.   Preoperative templating of the joint replacement has been completed, documented, and submitted to the Operating Room personnel in  order to optimize intra-operative equipment management.   Assessment/Plan:  End stage arthritis, right knee   The patient history, physical examination, clinical judgment of the provider and imaging studies are consistent with end stage degenerative joint disease of the right knee(s) and total knee arthroplasty is deemed medically necessary. The treatment options including medical management, injection therapy arthroscopy and arthroplasty were discussed at length. The risks and benefits of total knee arthroplasty were presented and reviewed. The risks due to aseptic loosening, infection, stiffness, patella tracking problems, thromboembolic complications and other imponderables were discussed. The patient acknowledged the explanation, agreed to proceed with the plan and consent was signed. Patient is being admitted for inpatient treatment for surgery, pain control, PT, OT, prophylactic  antibiotics, VTE prophylaxis, progressive ambulation and ADL's and discharge planning. The patient is planning to be discharged home with outpatient physical therapy.   Therapy Plans: outpatient therapy at [facility] Disposition: Home with wife Planned DVT Prophylaxis: Xarelto  DME needed: None PCP: Margaretmary Eddy ([place]) Cardiologist: Kirk Ruths, MD (cardiac clearance provided) Allergies: Iodinated contrast media Other: Pt currently takes Xarelto 20 mg daily due to hx of stroke and atrial fibrillation Referral for physical therapy provided by Leonides Grills, RN  - Patient was instructed on what medications to stop prior to surgery. - Follow-up visit in 2 weeks with Dr. Wynelle Link - Begin physical therapy following surgery - Pre-operative lab work as pre-surgical testing - Prescriptions will be provided in hospital at time of discharge  Theresa Duty, PA-C Orthopedic Surgery [facility] [place]

## 2017-10-17 LAB — COMPLETE METABOLIC PANEL WITH GFR
AG Ratio: 1.3 (calc) (ref 1.0–2.5)
ALKALINE PHOSPHATASE (APISO): 54 U/L (ref 40–115)
ALT: 19 U/L (ref 9–46)
AST: 13 U/L (ref 10–35)
Albumin: 3.8 g/dL (ref 3.6–5.1)
BILIRUBIN TOTAL: 0.6 mg/dL (ref 0.2–1.2)
BUN: 15 mg/dL (ref 7–25)
CO2: 27 mmol/L (ref 20–32)
CREATININE: 1.04 mg/dL (ref 0.70–1.18)
Calcium: 10 mg/dL (ref 8.6–10.3)
Chloride: 103 mmol/L (ref 98–110)
GFR, EST AFRICAN AMERICAN: 80 mL/min/{1.73_m2} (ref >=60)
GFR, Est Non African American: 69 mL/min/{1.73_m2} (ref >=60)
GLOBULIN: 2.9 g/dL (ref 1.9–3.7)
Glucose, Bld: 218 mg/dL — ABNORMAL HIGH (ref 65–99)
Potassium: 4.2 mmol/L (ref 3.5–5.3)
SODIUM: 138 mmol/L (ref 135–146)
Total Protein: 6.7 g/dL (ref 6.1–8.1)

## 2017-10-17 LAB — CBC WITH DIFFERENTIAL/PLATELET
BASOS ABS: 91 {cells}/uL (ref 0–200)
Basophils Relative: 1 %
EOS ABS: 482 {cells}/uL (ref 15–500)
Eosinophils Relative: 5.3 %
HEMATOCRIT: 42 % (ref 38.5–50.0)
Hemoglobin: 13.9 g/dL (ref 13.2–17.1)
LYMPHS ABS: 2876 {cells}/uL (ref 850–3900)
MCH: 29 pg (ref 27.0–33.0)
MCHC: 33.1 g/dL (ref 32.0–36.0)
MCV: 87.7 fL (ref 80.0–100.0)
MPV: 12 fL (ref 7.5–12.5)
Monocytes Relative: 9.9 %
Neutro Abs: 4750 cells/uL (ref 1500–7800)
Neutrophils Relative %: 52.2 %
Platelets: 203 10*3/uL (ref 140–400)
RBC: 4.79 10*6/uL (ref 4.20–5.80)
RDW: 12 % (ref 11.0–15.0)
Total Lymphocyte: 31.6 %
WBC mixed population: 901 cells/uL (ref 200–950)
WBC: 9.1 10*3/uL (ref 3.8–10.8)

## 2017-10-17 LAB — TEST AUTHORIZATION

## 2017-10-17 LAB — PSA: PSA: 3.5 ng/mL (ref <=4.0)

## 2017-10-17 LAB — HEMOGLOBIN A1C W/OUT EAG: Hgb A1c MFr Bld: 8.2 % of total Hgb — ABNORMAL HIGH (ref <5.7)

## 2017-10-17 LAB — LIPID PANEL
CHOL/HDL RATIO: 3.1 (calc) (ref <5.0)
CHOLESTEROL: 114 mg/dL (ref <200)
HDL: 37 mg/dL — ABNORMAL LOW (ref >40)
LDL Cholesterol (Calc): 61 mg/dL (calc)
Non-HDL Cholesterol (Calc): 77 mg/dL (calc) (ref <130)
Triglycerides: 75 mg/dL (ref <150)

## 2017-10-18 ENCOUNTER — Ambulatory Visit (HOSPITAL_COMMUNITY): Payer: Medicare HMO | Attending: Cardiology

## 2017-10-18 ENCOUNTER — Other Ambulatory Visit: Payer: Self-pay

## 2017-10-18 DIAGNOSIS — I10 Essential (primary) hypertension: Secondary | ICD-10-CM | POA: Insufficient documentation

## 2017-10-18 DIAGNOSIS — I77810 Thoracic aortic ectasia: Secondary | ICD-10-CM | POA: Diagnosis not present

## 2017-10-18 DIAGNOSIS — C649 Malignant neoplasm of unspecified kidney, except renal pelvis: Secondary | ICD-10-CM | POA: Diagnosis not present

## 2017-10-18 DIAGNOSIS — I4891 Unspecified atrial fibrillation: Secondary | ICD-10-CM | POA: Insufficient documentation

## 2017-10-18 DIAGNOSIS — I2510 Atherosclerotic heart disease of native coronary artery without angina pectoris: Secondary | ICD-10-CM | POA: Insufficient documentation

## 2017-10-18 DIAGNOSIS — G4733 Obstructive sleep apnea (adult) (pediatric): Secondary | ICD-10-CM | POA: Diagnosis not present

## 2017-10-18 DIAGNOSIS — E785 Hyperlipidemia, unspecified: Secondary | ICD-10-CM | POA: Insufficient documentation

## 2017-10-19 ENCOUNTER — Ambulatory Visit (INDEPENDENT_AMBULATORY_CARE_PROVIDER_SITE_OTHER): Payer: Medicare HMO | Admitting: Family Medicine

## 2017-10-19 ENCOUNTER — Encounter: Payer: Self-pay | Admitting: Family Medicine

## 2017-10-19 VITALS — BP 130/74 | HR 78 | Temp 97.9°F | Resp 18 | Ht 72.0 in | Wt 249.0 lb

## 2017-10-19 DIAGNOSIS — E118 Type 2 diabetes mellitus with unspecified complications: Secondary | ICD-10-CM | POA: Diagnosis not present

## 2017-10-19 DIAGNOSIS — R69 Illness, unspecified: Secondary | ICD-10-CM | POA: Diagnosis not present

## 2017-10-19 DIAGNOSIS — IMO0002 Reserved for concepts with insufficient information to code with codable children: Secondary | ICD-10-CM

## 2017-10-19 DIAGNOSIS — E1165 Type 2 diabetes mellitus with hyperglycemia: Secondary | ICD-10-CM

## 2017-10-19 MED ORDER — METFORMIN HCL 500 MG PO TABS: 1000.0000 mg | ORAL_TABLET | Freq: Two times a day (BID) | ORAL | 3 refills | Status: DC

## 2017-10-19 MED ORDER — GLUCOSE BLOOD VI STRP: ORAL_STRIP | 3 refills | Status: AC

## 2017-10-19 MED ORDER — ACCU-CHEK AVIVA PLUS W/DEVICE KIT: PACK | 0 refills | Status: AC

## 2017-10-19 MED ORDER — ACCU-CHEK MULTICLIX LANCET DEV KIT: PACK | 3 refills | Status: AC

## 2017-10-19 MED ORDER — ACCU-CHEK MULTICLIX LANCETS MISC: 3 refills | Status: AC

## 2017-10-19 NOTE — Progress Notes (Signed)
Subjective:    Patient ID: Henry Martin, male    DOB: 11/05/1940, 77 y.o.   MRN: [id number]  HPI  Patient was recently seen for preoperative clearance for right knee replacement scheduled to be performed September 11.  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  As a result, a follow-up hemoglobin A1c was obtained which confirmed type 2 diabetes.  Please see below No visits with results within 1 Week(s) from this visit.  Latest known visit with results is:  Office Visit on 10/11/2017  Component Date Value Ref Range Status  . WBC 10/15/2017 9.1  3.8 - 10.8 Thousand/uL Final  . RBC 10/15/2017 4.79  4.20 - 5.80 Million/uL Final  . Hemoglobin 10/15/2017 13.9  13.2 - 17.1 g/dL Final  . HCT 10/15/2017 42.0  38.5 - 50.0 % Final  . MCV 10/15/2017 87.7  80.0 - 100.0 fL Final  . MCH 10/15/2017 29.0  27.0 - 33.0 pg Final  . MCHC 10/15/2017 33.1  32.0 - 36.0 g/dL Final  . RDW 10/15/2017 12.0  11.0 - 15.0 % Final  . Platelets 10/15/2017 203  140 - 400 Thousand/uL Final  . MPV 10/15/2017 12.0  7.5 - 12.5 fL Final  . Neutro Abs 10/15/2017 4,750  1,500 - 7,800 cells/uL Final  . Lymphs Abs 10/15/2017 2,876  850 - 3,900 cells/uL Final  . WBC mixed population 10/15/2017 901  200 - 950 cells/uL Final  . Eosinophils Absolute 10/15/2017 482  15 - 500 cells/uL Final  . Basophils Absolute 10/15/2017 91  0 - 200 cells/uL Final  . Neutrophils Relative % 10/15/2017 52.2  % Final  . Total Lymphocyte 10/15/2017 31.6  % Final  . Monocytes Relative 10/15/2017 9.9  % Final  . Eosinophils Relative 10/15/2017 5.3  % Final  . Basophils Relative 10/15/2017 1.0  % Final  . Glucose, Bld 10/15/2017 218* 65 - 99 mg/dL Final   Comment: .            Fasting reference interval . For someone without known diabetes, a glucose value >125 mg/dL indicates that they may have diabetes and this should be confirmed with  a follow-up test. .   . BUN 10/15/2017 15  7 - 25 mg/dL Final  . Creat 10/15/2017 1.04  0.70 - 1.18 mg/dL Final   Comment: For patients >36 years of age, the reference limit for Creatinine is approximately 13% higher for people identified as African-American. .   . GFR, Est Non African American 10/15/2017 69  > OR = 60 mL/min/1.61m2 Final  . GFR, Est African American 10/15/2017 80  > OR = 60 mL/min/1.18m2 Final  . BUN/Creatinine Ratio 79/39/0300 NOT APPLICABLE  6 - 22 (calc) Final  . Sodium 10/15/2017 138  135 - 146 mmol/L Final  . Potassium 10/15/2017 4.2  3.5 - 5.3 mmol/L Final  . Chloride 10/15/2017 103  98 - 110 mmol/L Final  . CO2 10/15/2017 27  20 - 32 mmol/L Final  . Calcium 10/15/2017 10.0  8.6 - 10.3 mg/dL Final  . Total Protein 10/15/2017 6.7  6.1 - 8.1 g/dL Final  . Albumin 10/15/2017 3.8  3.6 - 5.1 g/dL Final  . Globulin 10/15/2017 2.9  1.9 - 3.7 g/dL (calc) Final  . AG Ratio 10/15/2017 1.3  1.0 - 2.5 (calc) Final  . Total Bilirubin 10/15/2017 0.6  0.2 - 1.2 mg/dL Final  .  Alkaline phosphatase (APISO) 10/15/2017 54  40 - 115 U/L Final  . AST 10/15/2017 13  10 - 35 U/L Final  . ALT 10/15/2017 19  9 - 46 U/L Final  . Cholesterol 10/15/2017 114  <200 mg/dL Final  . HDL 10/15/2017 37* >40 mg/dL Final  . Triglycerides 10/15/2017 75  <150 mg/dL Final  . LDL Cholesterol (Calc) 10/15/2017 61  mg/dL (calc) Final   Comment: Reference range: <100 . Desirable range <100 mg/dL for primary prevention;   <70 mg/dL for patients with CHD or diabetic patients  with > or = 2 CHD risk factors. Marland Kitchen LDL-C is now calculated using the Martin-Hopkins  calculation, which is a validated novel method providing  better accuracy than the Friedewald equation in the  estimation of LDL-C.  Cresenciano Genre et al. Annamaria Helling. 2841;324(40): 2061-2068  (http://education.[facility].com/faq/FAQ164)   . Total CHOL/HDL Ratio 10/15/2017 3.1  <5.0 (calc) Final  . Non-HDL Cholesterol (Calc) 10/15/2017 77  <130  mg/dL (calc) Final   Comment: For patients with diabetes plus 1 major ASCVD risk  factor, treating to a non-HDL-C goal of <100 mg/dL  (LDL-C of <70 mg/dL) is considered a therapeutic  option.   Marland Kitchen PSA 10/15/2017 3.5  < OR = 4.0 ng/mL Final   Comment: The total PSA value from this assay system is  standardized against the WHO standard. The test  result will be approximately 20% lower when compared  to the equimolar-standardized total PSA (Beckman  Coulter). Comparison of serial PSA results should be  interpreted with this fact in mind. . This test was performed using the Siemens  chemiluminescent method. Values obtained from  different assay methods cannot be used interchangeably. PSA levels, regardless of value, should not be interpreted as absolute evidence of the presence or absence of disease.   . Hgb A1c MFr Bld 10/15/2017 8.2* <5.7 % of total Hgb Final   Comment: For someone without known diabetes, a hemoglobin A1c value of 6.5% or greater indicates that they may have  diabetes and this should be confirmed with a follow-up  test. . For someone with known diabetes, a value <7% indicates  that their diabetes is well controlled and a value  greater than or equal to 7% indicates suboptimal  control. A1c targets should be individualized based on  duration of diabetes, age, comorbid conditions, and  other considerations. . Currently, no consensus exists regarding use of hemoglobin A1c for diagnosis of diabetes for children. .   . TEST NAME: 10/15/2017 HEMOGLOBIN A1c   Final  . TEST CODE: 10/15/2017 [id number]   Final  . CLIENT CONTACT: 10/15/2017 Danella Sensing   Final  . REPORT ALWAYS MESSAGE SIGNATURE 10/15/2017    Final   Comment: . The laboratory testing on this patient was verbally requested or confirmed by the ordering physician or his or her authorized representative after contact with an employee of [employer]. Federal regulations require that we maintain on file  written authorization for all laboratory testing.  Accordingly we are asking that the ordering physician or his or her authorized representative sign a copy of this report and promptly return it to the client service representative. . . Signature:____________________________________________________ . Please fax this signed page to [phone] or return it via your [employer] courier.    He is here today to discuss treatment options for his diabetes prior to his upcoming surgery. Past Medical History:  Diagnosis Date  . Atrial fibrillation with rapid ventricular response ([place]) 07/04/11   Xarelto started 4/13;    Marland Kitchen  CAD (coronary artery disease)    mild nonobstructive by cath '05 (ASTEROID trial - mLAD 30%, CFX 20-30%, OM 20%, mRCA 30%, normal LVF), EF 65% by echo '12;    . Complication of anesthesia    "bowels fall asleep & he can't have BM"  . GERD (gastroesophageal reflux disease) 07/04/11   "once in awhile; haven't been treated for it"  . HLD (hyperlipidemia)   . HTN (hypertension)   . OSA (obstructive sleep apnea)    "suppose to be wearing my mask"  . Renal cell cancer, right ([place]) 2000   s/p partial neprhectomy   . Renal cell carcinoma   . Skin cancer 06/2011   left ear  . Stroke [facility]) 2007   denies residual   Past Surgical History:  Procedure Laterality Date  . CARDIAC CATHETERIZATION  ~ 2007  . KNEE ARTHROSCOPY Left 1990's  . PARTIAL NEPHRECTOMY Right 2000   "took off  25%"  . THYROIDECTOMY, PARTIAL  ~ 2007  . TONSILLECTOMY AND ADENOIDECTOMY     "as a child"  . TOTAL KNEE ARTHROPLASTY Left 2010   Current Outpatient Medications on File Prior to Visit  Medication Sig Dispense Refill  . atorvastatin (LIPITOR) 10 MG tablet Take 1 tablet (10 mg total) daily by mouth. 90 tablet 3  . Cholecalciferol (VITAMIN D3) 1000 UNITS CAPS Take 1 capsule by mouth daily.     . Fish Oil OIL Take 800 mg by mouth daily.    . fluticasone (FLONASE) 50 MCG/ACT nasal spray PLACE 2  SPRAYS INTO THE NOSE EVERY EVENING. 16 g 3  . metoprolol succinate (TOPROL-XL) 25 MG 24 hr tablet Take 0.5 tablets (12.5 mg total) by mouth daily. 90 tablet 1  . olmesartan-hydrochlorothiazide (BENICAR HCT) 40-25 MG tablet TAKE 1 TABLET BY MOUTH ONCE A DAY 90 tablet 1  . tadalafil (CIALIS) 20 MG tablet Take 1 tablet (20 mg total) by mouth daily as needed for erectile dysfunction. 10 tablet 11  . XARELTO 20 MG TABS tablet TAKE 1 TABLET BY MOUTH ONCE A DAY 90 tablet 3   No current facility-administered medications on file prior to visit.    Allergies  Allergen Reactions  . Contrast Media [Iodinated Diagnostic Agents] Other (See Comments)    "Isovue"; "blisters; all down my legs"  . Metrizamide Other (See Comments)    "Isovue"; "blisters; all down my legs"   Social History   Socioeconomic History  . Marital status: Married    Spouse name: Not on file  . Number of children: Not on file  . Years of education: Not on file  . Highest education level: Not on file  Occupational History  . Not on file  Social Needs  . Financial resource strain: Not on file  . Food insecurity:    Worry: Not on file    Inability: Not on file  . Transportation needs:    Medical: Not on file    Non-medical: Not on file  Tobacco Use  . Smoking status: Former Smoker    Packs/day: 1.00    Years: 54.00    Pack years: 54.00    Types: Cigarettes    Last attempt to quit: 04/04/1977    Years since quitting: 40.5  . Smokeless tobacco: Former Systems developer    Types: [place] date: 10/04/2005  Substance and Sexual Activity  . Alcohol use: Yes    Comment: rare  . Drug use: No  . Sexual activity: Yes    Comment: married happily  to [place]  . Physical activity:    Days per week: Not on file    Minutes per session: Not on file  . Stress: Not on file  Relationships  . Social connections:    Talks on phone: Not on file    Gets together: Not on file    Attends religious service: Not on file    Active  member of club or organization: Not on file    Attends meetings of clubs or organizations: Not on file    Relationship status: Not on file  . Intimate partner violence:    Fear of current or ex partner: Not on file    Emotionally abused: Not on file    Physically abused: Not on file    Forced sexual activity: Not on file  Other Topics Concern  . Not on file  Social History Narrative   Retired. Lives in [place]   Family History  Problem Relation Age of Onset  . Heart failure Mother        died at 16  . Diabetes Mother   . Kidney cancer Father   . Colon cancer Neg Hx   . Prostate cancer Neg Hx       Review of Systems  All other systems reviewed and are negative.      Objective:   Physical Exam  Constitutional: He is oriented to person, place, and time. He appears well-developed and well-nourished. No distress.  HENT:  Head: Normocephalic and atraumatic.  Right Ear: External ear normal.  Left Ear: External ear normal.  Nose: Nose normal.  Mouth/Throat: Oropharynx is clear and moist. No oropharyngeal exudate.  Eyes: Pupils are equal, round, and reactive to light. Conjunctivae and EOM are normal. Right eye exhibits no discharge. Left eye exhibits no discharge. No scleral icterus.  Neck: Normal range of motion. Neck supple. No JVD present. No tracheal deviation present. No thyromegaly present.  Cardiovascular: Normal rate, regular rhythm, normal heart sounds and intact distal pulses. Exam reveals no gallop and no friction rub.  No murmur heard. Pulmonary/Chest: Effort normal and breath sounds normal. No stridor. No respiratory distress. He has no wheezes. He has no rales. He exhibits no tenderness.  Abdominal: Soft. Bowel sounds are normal. He exhibits no distension and no mass. There is no tenderness. There is no rebound and no guarding.  Musculoskeletal: Normal range of motion. He exhibits no edema or tenderness.  Lymphadenopathy:    He has no cervical adenopathy.    Neurological: He is alert and oriented to person, place, and time. He has normal reflexes. No cranial nerve deficit. He exhibits normal muscle tone. Coordination normal.  Skin: Skin is warm. No rash noted. He is not diaphoretic. No erythema. No pallor.  Psychiatric: He has a normal mood and affect. His behavior is normal. Judgment and thought content normal.  Vitals reviewed.         Assessment & Plan:  Diabetes mellitus type 2, uncontrolled, with complications (HCC)  Spent 20 minutes with the patient discussing a low carbohydrate diet, less than 45 g of carbohydrates per meal, less than 15 g of carbohydrates per snack.  We discussed with carbohydrates were in the foods to avoid in his diet.  I recommended 30 minutes a day 5 days a week of aerobic exercise and ultimately I like to see the patient try to lose 20 to 30 pounds over the next 6 to 10 months.  Meanwhile we will begin the patient on metformin  500 mg p.o. twice daily and increase to 1000 mg p.o. twice daily gradually over the next month.  Reassess hemoglobin A1c in 3 months.  Blood pressure today is acceptable.

## 2017-10-26 DIAGNOSIS — I771 Stricture of artery: Secondary | ICD-10-CM | POA: Diagnosis not present

## 2017-10-26 DIAGNOSIS — T671XXA Heat syncope, initial encounter: Secondary | ICD-10-CM | POA: Diagnosis not present

## 2017-10-26 DIAGNOSIS — T675XXA Heat exhaustion, unspecified, initial encounter: Secondary | ICD-10-CM | POA: Diagnosis not present

## 2017-10-26 DIAGNOSIS — E119 Type 2 diabetes mellitus without complications: Secondary | ICD-10-CM | POA: Diagnosis not present

## 2017-10-26 DIAGNOSIS — Z87891 Personal history of nicotine dependence: Secondary | ICD-10-CM | POA: Diagnosis not present

## 2017-10-26 DIAGNOSIS — Z7984 Long term (current) use of oral hypoglycemic drugs: Secondary | ICD-10-CM | POA: Diagnosis not present

## 2017-10-26 DIAGNOSIS — R55 Syncope and collapse: Secondary | ICD-10-CM | POA: Diagnosis not present

## 2017-10-26 DIAGNOSIS — E860 Dehydration: Secondary | ICD-10-CM | POA: Diagnosis not present

## 2017-11-07 NOTE — Patient Instructions (Addendum)
Henry Martin  11/07/2017   Your procedure is scheduled on: 11-14-17   Report to [facility] Main  Entrance    Report to admitting at 1:30PM    Call this number if you have problems the morning of surgery [phone]     Remember: Do not eat food After Midnight. YOU MAY HAVE CLEAR LIQUIDS FROM MIDNIGHT UNTIL 10AM. NOTHING BY MOUTH AFTER 10AM!      CLEAR LIQUID DIET   Foods Allowed                                                                     Foods Excluded  Coffee and tea, regular and decaf                             liquids that you cannot  Plain Jell-O in any flavor                                             see through such as: Fruit ices (not with fruit pulp)                                     milk, soups, orange juice  Iced Popsicles                                    All solid food Carbonated beverages, regular and diet                                    Cranberry, grape and apple juices Sports drinks like Gatorade Lightly seasoned clear broth or consume(fat free) Sugar, honey syrup  Sample Menu Breakfast                                Lunch                                     Supper Cranberry juice                    Beef broth                            Chicken broth Jell-O                                     Grape juice                           Apple juice Coffee or  tea                        Jell-O                                      Popsicle                                                Coffee or tea                        Coffee or tea  _____________________________________________________________________    Take these medicines the morning of surgery with A SIP OF WATER: tylenol if needed, atorvastatin                                 You may not have any metal on your body including hair pins and              piercings  Do not wear jewelry, make-up, lotions, powders or perfumes, deodorant             Do not wear nail  polish.  Do not shave  48 hours prior to surgery.         Do not bring valuables to the hospital. [place].  Contacts, dentures or bridgework may not be worn into surgery.  Leave suitcase in the car. After surgery it may be brought to your room.               Please read over the following fact sheets you were given: _____________________________________________________________________            [facility] - Preparing for Surgery Before surgery, you can play an important role.  Because skin is not sterile, your skin needs to be as free of germs as possible.  You can reduce the number of germs on your skin by washing with CHG (chlorahexidine gluconate) soap before surgery.  CHG is an antiseptic cleaner which kills germs and bonds with the skin to continue killing germs even after washing. Please DO NOT use if you have an allergy to CHG or antibacterial soaps.  If your skin becomes reddened/irritated stop using the CHG and inform your nurse when you arrive at Short Stay. Do not shave (including legs and underarms) for at least 48 hours prior to the first CHG shower.  You may shave your face/neck. Please follow these instructions carefully:  1.  Shower with CHG Soap the night before surgery and the  morning of Surgery.  2.  If you choose to wash your hair, wash your hair first as usual with your  normal  shampoo.  3.  After you shampoo, rinse your hair and body thoroughly to remove the  shampoo.                           4.  Use CHG as you would any other liquid soap.  You can apply chg directly  to the skin and wash  Gently with a scrungie or clean washcloth.  5.  Apply the CHG Soap to your body ONLY FROM THE NECK DOWN.   Do not use on face/ open                           Wound or open sores. Avoid contact with eyes, ears mouth and genitals (private parts).                       Wash face,  Genitals (private parts) with  your normal soap.             6.  Wash thoroughly, paying special attention to the area where your surgery  will be performed.  7.  Thoroughly rinse your body with warm water from the neck down.  8.  DO NOT shower/wash with your normal soap after using and rinsing off  the CHG Soap.                9.  Pat yourself dry with a clean towel.            10.  Wear clean pajamas.            11.  Place clean sheets on your bed the night of your first shower and do not  sleep with pets. Day of Surgery : Do not apply any lotions/deodorants the morning of surgery.  Please wear clean clothes to the hospital/surgery center.  FAILURE TO FOLLOW THESE INSTRUCTIONS MAY RESULT IN THE CANCELLATION OF YOUR SURGERY PATIENT SIGNATURE_________________________________  NURSE SIGNATURE__________________________________  ________________________________________________________________________   Adam Phenix  An incentive spirometer is a tool that can help keep your lungs clear and active. This tool measures how well you are filling your lungs with each breath. Taking long deep breaths may help reverse or decrease the chance of developing breathing (pulmonary) problems (especially infection) following:  A long period of time when you are unable to move or be active. BEFORE THE PROCEDURE   If the spirometer includes an indicator to show your best effort, your nurse or respiratory therapist will set it to a desired goal.  If possible, sit up straight or lean slightly forward. Try not to slouch.  Hold the incentive spirometer in an upright position. INSTRUCTIONS FOR USE  1. Sit on the edge of your bed if possible, or sit up as far as you can in bed or on a chair. 2. Hold the incentive spirometer in an upright position. 3. Breathe out normally. 4. Place the mouthpiece in your mouth and seal your lips tightly around it. 5. Breathe in slowly and as deeply as possible, raising the piston or the ball toward  the top of the column. 6. Hold your breath for 3-5 seconds or for as long as possible. Allow the piston or ball to fall to the bottom of the column. 7. Remove the mouthpiece from your mouth and breathe out normally. 8. Rest for a few seconds and repeat Steps 1 through 7 at least 10 times every 1-2 hours when you are awake. Take your time and take a few normal breaths between deep breaths. 9. The spirometer may include an indicator to show your best effort. Use the indicator as a goal to work toward during each repetition. 10. After each set of 10 deep breaths, practice coughing to be sure your lungs are clear. If you have an incision (the cut made at the time of  surgery), support your incision when coughing by placing a pillow or rolled up towels firmly against it. Once you are able to get out of bed, walk around indoors and cough well. You may stop using the incentive spirometer when instructed by your caregiver.  RISKS AND COMPLICATIONS  Take your time so you do not get dizzy or light-headed.  If you are in pain, you may need to take or ask for pain medication before doing incentive spirometry. It is harder to take a deep breath if you are having pain. AFTER USE  Rest and breathe slowly and easily.  It can be helpful to keep track of a log of your progress. Your caregiver can provide you with a simple table to help with this. If you are using the spirometer at home, follow these instructions: [place] IF:   You are having difficultly using the spirometer.  You have trouble using the spirometer as often as instructed.  Your pain medication is not giving enough relief while using the spirometer.  You develop fever of 100.5 F (38.1 C) or higher. SEEK IMMEDIATE MEDICAL CARE IF:   You cough up bloody sputum that had not been present before.  You develop fever of 102 F (38.9 C) or greater.  You develop worsening pain at or near the incision site. MAKE SURE YOU:    Understand these instructions.  Will watch your condition.  Will get help right away if you are not doing well or get worse. Document Released: 07/03/2006 Document Revised: 05/15/2011 Document Reviewed: 09/03/2006 ExitCare Patient Information 2014 Exit[employer], [place].   ________________________________________________________________________  WHAT IS A BLOOD TRANSFUSION? Blood Transfusion Information  A transfusion is the replacement of blood or some of its parts. Blood is made up of multiple cells which provide different functions.  Red blood cells carry oxygen and are used for blood loss replacement.  White blood cells fight against infection.  Platelets control bleeding.  Plasma helps clot blood.  Other blood products are available for specialized needs, such as hemophilia or other clotting disorders. BEFORE THE TRANSFUSION  Who gives blood for transfusions?   Healthy volunteers who are fully evaluated to make sure their blood is safe. This is blood bank blood. Transfusion therapy is the safest it has ever been in the practice of medicine. Before blood is taken from a donor, a complete history is taken to make sure that person has no history of diseases nor engages in risky social behavior (examples are intravenous drug use or sexual activity with multiple partners). The donor's travel history is screened to minimize risk of transmitting infections, such as malaria. The donated blood is tested for signs of infectious diseases, such as HIV and hepatitis. The blood is then tested to be sure it is compatible with you in order to minimize the chance of a transfusion reaction. If you or a relative donates blood, this is often done in anticipation of surgery and is not appropriate for emergency situations. It takes many days to process the donated blood. RISKS AND COMPLICATIONS Although transfusion therapy is very safe and saves many lives, the main dangers of transfusion include:    Getting an infectious disease.  Developing a transfusion reaction. This is an allergic reaction to something in the blood you were given. Every precaution is taken to prevent this. The decision to have a blood transfusion has been considered carefully by your caregiver before blood is given. Blood is not given unless the benefits outweigh the risks. AFTER THE  TRANSFUSION  Right after receiving a blood transfusion, you will usually feel much better and more energetic. This is especially true if your red blood cells have gotten low (anemic). The transfusion raises the level of the red blood cells which carry oxygen, and this usually causes an energy increase.  The nurse administering the transfusion will monitor you carefully for complications. HOME CARE INSTRUCTIONS  No special instructions are needed after a transfusion. You may find your energy is better. Speak with your caregiver about any limitations on activity for underlying diseases you may have. SEEK MEDICAL CARE IF:   Your condition is not improving after your transfusion.  You develop redness or irritation at the intravenous (IV) site. SEEK IMMEDIATE MEDICAL CARE IF:  Any of the following symptoms occur over the next 12 hours:  Shaking chills.  You have a temperature by mouth above 102 F (38.9 C), not controlled by medicine.  Chest, back, or muscle pain.  People around you feel you are not acting correctly or are confused.  Shortness of breath or difficulty breathing.  Dizziness and fainting.  You get a rash or develop hives.  You have a decrease in urine output.  Your urine turns a dark color or changes to pink, red, or brown. Any of the following symptoms occur over the next 10 days:  You have a temperature by mouth above 102 F (38.9 C), not controlled by medicine.  Shortness of breath.  Weakness after normal activity.  The white part of the eye turns yellow (jaundice).  You have a decrease in the  amount of urine or are urinating less often.  Your urine turns a dark color or changes to pink, red, or brown. Document Released: 02/18/2000 Document Revised: 05/15/2011 Document Reviewed: 10/07/2007 [facility] Patient Information 2014 [place], [place].  _______________________________________________________________________

## 2017-11-07 NOTE — Progress Notes (Signed)
Lov/caridiology clearance 10-15-17 epic   ekg 10-15-17 epic   Hgba1c, cbcdiff, cmp  10-15-17 epic   Echo 10-18-17 epic

## 2017-11-08 ENCOUNTER — Encounter (HOSPITAL_COMMUNITY)
Admission: RE | Admit: 2017-11-08 | Discharge: 2017-11-08 | Disposition: A | Discharge status: Home / Self Care | Payer: Medicare HMO | Source: Ambulatory Visit | Attending: Orthopedic Surgery | Admitting: Orthopedic Surgery

## 2017-11-08 ENCOUNTER — Encounter (HOSPITAL_COMMUNITY): Payer: Self-pay

## 2017-11-08 ENCOUNTER — Other Ambulatory Visit: Payer: Self-pay

## 2017-11-08 DIAGNOSIS — M1711 Unilateral primary osteoarthritis, right knee: Secondary | ICD-10-CM | POA: Diagnosis not present

## 2017-11-08 DIAGNOSIS — Z85828 Personal history of other malignant neoplasm of skin: Secondary | ICD-10-CM | POA: Insufficient documentation

## 2017-11-08 DIAGNOSIS — Z01812 Encounter for preprocedural laboratory examination: Secondary | ICD-10-CM | POA: Insufficient documentation

## 2017-11-08 DIAGNOSIS — E785 Hyperlipidemia, unspecified: Secondary | ICD-10-CM | POA: Insufficient documentation

## 2017-11-08 DIAGNOSIS — E119 Type 2 diabetes mellitus without complications: Secondary | ICD-10-CM | POA: Insufficient documentation

## 2017-11-08 DIAGNOSIS — I2510 Atherosclerotic heart disease of native coronary artery without angina pectoris: Secondary | ICD-10-CM | POA: Diagnosis not present

## 2017-11-08 DIAGNOSIS — G4733 Obstructive sleep apnea (adult) (pediatric): Secondary | ICD-10-CM | POA: Diagnosis not present

## 2017-11-08 DIAGNOSIS — Z8673 Personal history of transient ischemic attack (TIA), and cerebral infarction without residual deficits: Secondary | ICD-10-CM | POA: Diagnosis not present

## 2017-11-08 DIAGNOSIS — Z87891 Personal history of nicotine dependence: Secondary | ICD-10-CM | POA: Insufficient documentation

## 2017-11-08 DIAGNOSIS — Z79899 Other long term (current) drug therapy: Secondary | ICD-10-CM | POA: Insufficient documentation

## 2017-11-08 DIAGNOSIS — I10 Essential (primary) hypertension: Secondary | ICD-10-CM | POA: Insufficient documentation

## 2017-11-08 LAB — COMPREHENSIVE METABOLIC PANEL
ALT: 28 U/L (ref 0–44)
AST: 23 U/L (ref 15–41)
Albumin: 4 g/dL (ref 3.5–5.0)
Alkaline Phosphatase: 44 U/L (ref 38–126)
Anion gap: 7 (ref 5–15)
BILIRUBIN TOTAL: 0.9 mg/dL (ref 0.3–1.2)
BUN: 19 mg/dL (ref 8–23)
CHLORIDE: 106 mmol/L (ref 98–111)
CO2: 28 mmol/L (ref 22–32)
CREATININE: 1.21 mg/dL (ref 0.61–1.24)
Calcium: 10.2 mg/dL (ref 8.9–10.3)
GFR calc Af Amer: >60 mL/min (ref >60)
GFR calc non Af Amer: 56 mL/min — ABNORMAL LOW (ref >60)
GLUCOSE: 114 mg/dL — AB (ref 70–99)
Potassium: 4.6 mmol/L (ref 3.5–5.1)
Sodium: 141 mmol/L (ref 135–145)
TOTAL PROTEIN: 7.4 g/dL (ref 6.5–8.1)

## 2017-11-08 LAB — SURGICAL PCR SCREEN
MRSA, PCR: NEGATIVE
STAPHYLOCOCCUS AUREUS: NEGATIVE

## 2017-11-08 LAB — GLUCOSE, CAPILLARY: Glucose-Capillary: 156 mg/dL — ABNORMAL HIGH (ref 70–99)

## 2017-11-08 LAB — PROTIME-INR
INR: 1.34
PROTHROMBIN TIME: 16.5 s — AB (ref 11.4–15.2)

## 2017-11-08 LAB — APTT: aPTT: 38 seconds — ABNORMAL HIGH (ref 24–36)

## 2017-11-08 LAB — ABO/RH: ABO/RH(D): A NEG

## 2017-11-13 MED ORDER — BUPIVACAINE LIPOSOME 1.3 % IJ SUSP
20.0000 mL | Freq: Once | INTRAMUSCULAR | Status: DC
  Filled 2017-11-13 (×2): qty 20

## 2017-11-14 ENCOUNTER — Encounter (HOSPITAL_COMMUNITY): Payer: Self-pay | Admitting: *Deleted

## 2017-11-14 ENCOUNTER — Other Ambulatory Visit: Payer: Self-pay

## 2017-11-14 ENCOUNTER — Observation Stay (HOSPITAL_COMMUNITY)
Admission: RE | Admit: 2017-11-14 | Discharge: 2017-11-15 | Disposition: A | Discharge status: Home / Self Care | Payer: Medicare HMO | Source: Ambulatory Visit | Attending: Orthopedic Surgery | Admitting: Orthopedic Surgery

## 2017-11-14 ENCOUNTER — Ambulatory Visit (HOSPITAL_COMMUNITY): Payer: Medicare HMO | Admitting: Anesthesiology

## 2017-11-14 ENCOUNTER — Encounter (HOSPITAL_COMMUNITY)
Admission: RE | Disposition: A | Discharge status: Home / Self Care | Payer: Self-pay | Source: Ambulatory Visit | Attending: Orthopedic Surgery

## 2017-11-14 DIAGNOSIS — Z8673 Personal history of transient ischemic attack (TIA), and cerebral infarction without residual deficits: Secondary | ICD-10-CM | POA: Diagnosis not present

## 2017-11-14 DIAGNOSIS — I2510 Atherosclerotic heart disease of native coronary artery without angina pectoris: Secondary | ICD-10-CM | POA: Insufficient documentation

## 2017-11-14 DIAGNOSIS — G4730 Sleep apnea, unspecified: Secondary | ICD-10-CM | POA: Diagnosis not present

## 2017-11-14 DIAGNOSIS — I4891 Unspecified atrial fibrillation: Secondary | ICD-10-CM | POA: Diagnosis not present

## 2017-11-14 DIAGNOSIS — G8918 Other acute postprocedural pain: Secondary | ICD-10-CM | POA: Diagnosis not present

## 2017-11-14 DIAGNOSIS — I10 Essential (primary) hypertension: Secondary | ICD-10-CM | POA: Diagnosis not present

## 2017-11-14 DIAGNOSIS — M1711 Unilateral primary osteoarthritis, right knee: Principal | ICD-10-CM | POA: Insufficient documentation

## 2017-11-14 DIAGNOSIS — Z7901 Long term (current) use of anticoagulants: Secondary | ICD-10-CM | POA: Insufficient documentation

## 2017-11-14 DIAGNOSIS — Z905 Acquired absence of kidney: Secondary | ICD-10-CM | POA: Insufficient documentation

## 2017-11-14 DIAGNOSIS — G4733 Obstructive sleep apnea (adult) (pediatric): Secondary | ICD-10-CM | POA: Insufficient documentation

## 2017-11-14 DIAGNOSIS — Z79899 Other long term (current) drug therapy: Secondary | ICD-10-CM | POA: Insufficient documentation

## 2017-11-14 DIAGNOSIS — M25561 Pain in right knee: Secondary | ICD-10-CM | POA: Diagnosis present

## 2017-11-14 DIAGNOSIS — Z96652 Presence of left artificial knee joint: Secondary | ICD-10-CM | POA: Insufficient documentation

## 2017-11-14 DIAGNOSIS — Z85828 Personal history of other malignant neoplasm of skin: Secondary | ICD-10-CM | POA: Insufficient documentation

## 2017-11-14 DIAGNOSIS — M179 Osteoarthritis of knee, unspecified: Secondary | ICD-10-CM | POA: Diagnosis present

## 2017-11-14 DIAGNOSIS — M1710 Unilateral primary osteoarthritis, unspecified knee: Secondary | ICD-10-CM

## 2017-11-14 DIAGNOSIS — E119 Type 2 diabetes mellitus without complications: Secondary | ICD-10-CM | POA: Insufficient documentation

## 2017-11-14 DIAGNOSIS — E785 Hyperlipidemia, unspecified: Secondary | ICD-10-CM | POA: Insufficient documentation

## 2017-11-14 DIAGNOSIS — Z85528 Personal history of other malignant neoplasm of kidney: Secondary | ICD-10-CM | POA: Insufficient documentation

## 2017-11-14 DIAGNOSIS — Z7951 Long term (current) use of inhaled steroids: Secondary | ICD-10-CM | POA: Diagnosis not present

## 2017-11-14 DIAGNOSIS — M25761 Osteophyte, right knee: Secondary | ICD-10-CM | POA: Insufficient documentation

## 2017-11-14 DIAGNOSIS — Z87891 Personal history of nicotine dependence: Secondary | ICD-10-CM | POA: Diagnosis not present

## 2017-11-14 DIAGNOSIS — Z7984 Long term (current) use of oral hypoglycemic drugs: Secondary | ICD-10-CM | POA: Insufficient documentation

## 2017-11-14 HISTORY — PX: PARTIAL KNEE ARTHROPLASTY: SHX2174

## 2017-11-14 LAB — TYPE AND SCREEN
ABO/RH(D): A NEG
Antibody Screen: NEGATIVE

## 2017-11-14 LAB — GLUCOSE, CAPILLARY
GLUCOSE-CAPILLARY: 96 mg/dL (ref 70–99)
GLUCOSE-CAPILLARY: 185 mg/dL — AB (ref 70–99)
Glucose-Capillary: 92 mg/dL (ref 70–99)

## 2017-11-14 LAB — PROTIME-INR
INR: 1.09
Prothrombin Time: 14 seconds (ref 11.4–15.2)

## 2017-11-14 SURGERY — ARTHROPLASTY, KNEE, UNICOMPARTMENTAL
Anesthesia: General | Site: Knee | Laterality: Right

## 2017-11-14 MED ORDER — STERILE WATER FOR IRRIGATION IR SOLN
Status: DC | PRN
  Administered 2017-11-14: 2000 mL

## 2017-11-14 MED ORDER — METOPROLOL SUCCINATE ER 25 MG PO TB24
12.5000 mg | ORAL_TABLET | Freq: Every day | ORAL | Status: DC
  Administered 2017-11-14: 12.5 mg via ORAL
  Filled 2017-11-14: qty 1

## 2017-11-14 MED ORDER — ONDANSETRON HCL 4 MG PO TABS: 4.0000 mg | ORAL_TABLET | Freq: Four times a day (QID) | ORAL | Status: DC | PRN

## 2017-11-14 MED ORDER — DOCUSATE SODIUM 100 MG PO CAPS
100.0000 mg | ORAL_CAPSULE | Freq: Two times a day (BID) | ORAL | Status: DC
  Administered 2017-11-14 – 2017-11-15 (×2): 100 mg via ORAL
  Filled 2017-11-14 (×2): qty 1

## 2017-11-14 MED ORDER — 0.9 % SODIUM CHLORIDE (POUR BTL) OPTIME
TOPICAL | Status: DC | PRN
  Administered 2017-11-14: 1000 mL

## 2017-11-14 MED ORDER — PHENOL 1.4 % MT LIQD
1.0000 | OROMUCOSAL | Status: DC | PRN
  Filled 2017-11-14: qty 177

## 2017-11-14 MED ORDER — FENTANYL CITRATE (PF) 100 MCG/2ML IJ SOLN
INTRAMUSCULAR | Status: AC
  Filled 2017-11-14: qty 2

## 2017-11-14 MED ORDER — OLMESARTAN MEDOXOMIL-HCTZ 40-25 MG PO TABS: 1.0000 | ORAL_TABLET | Freq: Every day | ORAL | Status: DC

## 2017-11-14 MED ORDER — DEXAMETHASONE SODIUM PHOSPHATE 10 MG/ML IJ SOLN
INTRAMUSCULAR | Status: DC | PRN
  Administered 2017-11-14: 10 mg via INTRAVENOUS

## 2017-11-14 MED ORDER — METOCLOPRAMIDE HCL 5 MG/ML IJ SOLN: 5.0000 mg | Freq: Three times a day (TID) | INTRAMUSCULAR | Status: DC | PRN

## 2017-11-14 MED ORDER — METHOCARBAMOL 500 MG PO TABS
500.0000 mg | ORAL_TABLET | Freq: Four times a day (QID) | ORAL | Status: DC | PRN
  Administered 2017-11-15: 500 mg via ORAL
  Filled 2017-11-14 (×2): qty 1

## 2017-11-14 MED ORDER — METOCLOPRAMIDE HCL 5 MG PO TABS: 5.0000 mg | ORAL_TABLET | Freq: Three times a day (TID) | ORAL | Status: DC | PRN

## 2017-11-14 MED ORDER — ATORVASTATIN CALCIUM 10 MG PO TABS
10.0000 mg | ORAL_TABLET | Freq: Every day | ORAL | Status: DC
  Administered 2017-11-15: 10 mg via ORAL
  Filled 2017-11-14: qty 1

## 2017-11-14 MED ORDER — FENTANYL CITRATE (PF) 100 MCG/2ML IJ SOLN
50.0000 ug | INTRAMUSCULAR | Status: DC
  Administered 2017-11-14: 50 ug via INTRAVENOUS

## 2017-11-14 MED ORDER — IRBESARTAN 150 MG PO TABS
300.0000 mg | ORAL_TABLET | Freq: Every day | ORAL | Status: DC
  Filled 2017-11-14: qty 2

## 2017-11-14 MED ORDER — FENTANYL CITRATE (PF) 250 MCG/5ML IJ SOLN
INTRAMUSCULAR | Status: AC
  Filled 2017-11-14: qty 5

## 2017-11-14 MED ORDER — HYDROMORPHONE HCL 1 MG/ML IJ SOLN
INTRAMUSCULAR | Status: DC | PRN
  Administered 2017-11-14 (×2): 1 mg via INTRAVENOUS

## 2017-11-14 MED ORDER — HYDROMORPHONE HCL 2 MG/ML IJ SOLN
INTRAMUSCULAR | Status: AC
  Filled 2017-11-14: qty 1

## 2017-11-14 MED ORDER — EPHEDRINE SULFATE-NACL 50-0.9 MG/10ML-% IV SOSY
PREFILLED_SYRINGE | INTRAVENOUS | Status: DC | PRN
  Administered 2017-11-14 (×3): 10 mg via INTRAVENOUS

## 2017-11-14 MED ORDER — FENTANYL CITRATE (PF) 100 MCG/2ML IJ SOLN
INTRAMUSCULAR | Status: DC | PRN
  Administered 2017-11-14 (×3): 50 ug via INTRAVENOUS
  Administered 2017-11-14: 100 ug via INTRAVENOUS
  Administered 2017-11-14 (×2): 50 ug via INTRAVENOUS

## 2017-11-14 MED ORDER — DIPHENHYDRAMINE HCL 12.5 MG/5ML PO ELIX: 12.5000 mg | ORAL_SOLUTION | ORAL | Status: DC | PRN

## 2017-11-14 MED ORDER — CEFAZOLIN SODIUM-DEXTROSE 2-4 GM/100ML-% IV SOLN
2.0000 g | INTRAVENOUS | Status: AC
  Administered 2017-11-14: 2 g via INTRAVENOUS
  Filled 2017-11-14: qty 100

## 2017-11-14 MED ORDER — SODIUM CHLORIDE 0.9 % IJ SOLN
INTRAMUSCULAR | Status: DC | PRN
  Administered 2017-11-14: 40 mL

## 2017-11-14 MED ORDER — DEXAMETHASONE SODIUM PHOSPHATE 10 MG/ML IJ SOLN
10.0000 mg | Freq: Once | INTRAMUSCULAR | Status: AC
  Administered 2017-11-15: 10 mg via INTRAVENOUS
  Filled 2017-11-14: qty 1

## 2017-11-14 MED ORDER — CHLORHEXIDINE GLUCONATE 4 % EX LIQD: 60.0000 mL | Freq: Once | CUTANEOUS | Status: DC

## 2017-11-14 MED ORDER — DEXAMETHASONE SODIUM PHOSPHATE 10 MG/ML IJ SOLN
INTRAMUSCULAR | Status: AC
  Filled 2017-11-14: qty 1

## 2017-11-14 MED ORDER — MENTHOL 3 MG MT LOZG: 1.0000 | LOZENGE | OROMUCOSAL | Status: DC | PRN

## 2017-11-14 MED ORDER — LACTATED RINGERS IV SOLN
INTRAVENOUS | Status: DC
  Administered 2017-11-14 (×2): via INTRAVENOUS

## 2017-11-14 MED ORDER — POLYETHYLENE GLYCOL 3350 17 G PO PACK: 17.0000 g | PACK | Freq: Every day | ORAL | Status: DC | PRN

## 2017-11-14 MED ORDER — ALBUMIN HUMAN 5 % IV SOLN
INTRAVENOUS | Status: DC | PRN
  Administered 2017-11-14: 17:00:00 via INTRAVENOUS

## 2017-11-14 MED ORDER — PROPOFOL 10 MG/ML IV BOLUS
INTRAVENOUS | Status: DC | PRN
  Administered 2017-11-14: 200 mg via INTRAVENOUS

## 2017-11-14 MED ORDER — MIDAZOLAM HCL 2 MG/2ML IJ SOLN
1.0000 mg | INTRAMUSCULAR | Status: DC
  Administered 2017-11-14: 2 mg via INTRAVENOUS

## 2017-11-14 MED ORDER — METHOCARBAMOL 500 MG IVPB - SIMPLE MED
500.0000 mg | Freq: Four times a day (QID) | INTRAVENOUS | Status: DC | PRN
  Administered 2017-11-14: 500 mg via INTRAVENOUS
  Filled 2017-11-14: qty 500
  Filled 2017-11-14: qty 50

## 2017-11-14 MED ORDER — SUCCINYLCHOLINE CHLORIDE 200 MG/10ML IV SOSY
PREFILLED_SYRINGE | INTRAVENOUS | Status: DC | PRN
  Administered 2017-11-14: 100 mg via INTRAVENOUS

## 2017-11-14 MED ORDER — LIDOCAINE 2% (20 MG/ML) 5 ML SYRINGE
INTRAMUSCULAR | Status: DC | PRN
  Administered 2017-11-14: 100 mg via INTRAVENOUS

## 2017-11-14 MED ORDER — SUCCINYLCHOLINE CHLORIDE 200 MG/10ML IV SOSY
PREFILLED_SYRINGE | INTRAVENOUS | Status: AC
  Filled 2017-11-14: qty 10

## 2017-11-14 MED ORDER — TRANEXAMIC ACID 1000 MG/10ML IV SOLN
2000.0000 mg | Freq: Once | INTRAVENOUS | Status: DC
  Filled 2017-11-14: qty 20

## 2017-11-14 MED ORDER — BUPIVACAINE HCL (PF) 0.25 % IJ SOLN
INTRAMUSCULAR | Status: AC
  Filled 2017-11-14: qty 30

## 2017-11-14 MED ORDER — MIDAZOLAM HCL 2 MG/2ML IJ SOLN
INTRAMUSCULAR | Status: AC
  Filled 2017-11-14: qty 2

## 2017-11-14 MED ORDER — ONDANSETRON HCL 4 MG/2ML IJ SOLN
INTRAMUSCULAR | Status: AC
  Filled 2017-11-14: qty 2

## 2017-11-14 MED ORDER — FLEET ENEMA 7-19 GM/118ML RE ENEM: 1.0000 | ENEMA | Freq: Once | RECTAL | Status: DC | PRN

## 2017-11-14 MED ORDER — ONDANSETRON HCL 4 MG/2ML IJ SOLN: 4.0000 mg | Freq: Four times a day (QID) | INTRAMUSCULAR | Status: DC | PRN

## 2017-11-14 MED ORDER — SODIUM CHLORIDE 0.9 % IJ SOLN
INTRAMUSCULAR | Status: AC
  Filled 2017-11-14: qty 50

## 2017-11-14 MED ORDER — HYDROMORPHONE HCL 1 MG/ML IJ SOLN: 0.2500 mg | INTRAMUSCULAR | Status: DC | PRN

## 2017-11-14 MED ORDER — PROPOFOL 10 MG/ML IV BOLUS
INTRAVENOUS | Status: AC
  Filled 2017-11-14: qty 40

## 2017-11-14 MED ORDER — ALBUMIN HUMAN 5 % IV SOLN
INTRAVENOUS | Status: AC
  Filled 2017-11-14: qty 250

## 2017-11-14 MED ORDER — HYDROCHLOROTHIAZIDE 25 MG PO TABS
25.0000 mg | ORAL_TABLET | Freq: Every day | ORAL | Status: DC
  Filled 2017-11-14: qty 1

## 2017-11-14 MED ORDER — ACETAMINOPHEN 500 MG PO TABS
1000.0000 mg | ORAL_TABLET | Freq: Four times a day (QID) | ORAL | Status: DC
  Administered 2017-11-14 – 2017-11-15 (×3): 1000 mg via ORAL
  Filled 2017-11-14 (×3): qty 2

## 2017-11-14 MED ORDER — RIVAROXABAN 10 MG PO TABS
10.0000 mg | ORAL_TABLET | Freq: Every day | ORAL | Status: DC
  Administered 2017-11-15: 10 mg via ORAL
  Filled 2017-11-14: qty 1

## 2017-11-14 MED ORDER — HYDROMORPHONE HCL 1 MG/ML IJ SOLN
INTRAMUSCULAR | Status: AC
  Filled 2017-11-14: qty 2

## 2017-11-14 MED ORDER — SODIUM CHLORIDE 0.9 % IV SOLN
INTRAVENOUS | Status: DC
  Administered 2017-11-14: 21:00:00 via INTRAVENOUS

## 2017-11-14 MED ORDER — HYDROMORPHONE HCL 1 MG/ML IJ SOLN: 0.5000 mg | INTRAMUSCULAR | Status: DC | PRN

## 2017-11-14 MED ORDER — BISACODYL 10 MG RE SUPP: 10.0000 mg | Freq: Every day | RECTAL | Status: DC | PRN

## 2017-11-14 MED ORDER — ACETAMINOPHEN 10 MG/ML IV SOLN
1000.0000 mg | Freq: Four times a day (QID) | INTRAVENOUS | Status: DC
  Administered 2017-11-14: 1000 mg via INTRAVENOUS
  Filled 2017-11-14: qty 100

## 2017-11-14 MED ORDER — BUPIVACAINE LIPOSOME 1.3 % IJ SUSP
INTRAMUSCULAR | Status: DC | PRN
  Administered 2017-11-14: 20 mL

## 2017-11-14 MED ORDER — CEFAZOLIN SODIUM-DEXTROSE 2-4 GM/100ML-% IV SOLN
2.0000 g | Freq: Four times a day (QID) | INTRAVENOUS | Status: AC
  Administered 2017-11-14 – 2017-11-15 (×2): 2 g via INTRAVENOUS
  Filled 2017-11-14 (×2): qty 100

## 2017-11-14 MED ORDER — OXYCODONE HCL 5 MG PO TABS
5.0000 mg | ORAL_TABLET | ORAL | Status: DC | PRN
  Administered 2017-11-15: 5 mg via ORAL
  Filled 2017-11-14: qty 2

## 2017-11-14 MED ORDER — OXYCODONE HCL 5 MG PO TABS: 10.0000 mg | ORAL_TABLET | ORAL | Status: DC | PRN

## 2017-11-14 MED ORDER — SODIUM CHLORIDE 0.9 % IR SOLN
Status: DC | PRN
  Administered 2017-11-14: 1000 mL

## 2017-11-14 MED ORDER — LIDOCAINE 2% (20 MG/ML) 5 ML SYRINGE
INTRAMUSCULAR | Status: AC
  Filled 2017-11-14: qty 5

## 2017-11-14 SURGICAL SUPPLY — 63 items
BAG DECANTER FOR FLEXI CONT (MISCELLANEOUS) IMPLANT
BAG SPEC THK2 15X12 ZIP CLS (MISCELLANEOUS)
BAG ZIPLOCK 12X15 (MISCELLANEOUS) IMPLANT
BANDAGE ACE 6X5 VEL STRL LF (GAUZE/BANDAGES/DRESSINGS) ×2 IMPLANT
BLADE SAW RECIPROCATING 77.5 (BLADE) ×2 IMPLANT
BLADE SAW SGTL 11.0X1.19X90.0M (BLADE) ×2 IMPLANT
BLADE SAW SGTL 13.0X1.19X90.0M (BLADE) ×2 IMPLANT
BOWL SMART MIX CTS (DISPOSABLE) ×2 IMPLANT
BSPLAT TIB G STRL KN RT MED TI (Orthopedic Implant) ×1 IMPLANT
BUR OVAL CARBIDE 4.0 (BURR) ×2 IMPLANT
CEMENT HV SMART SET (Cement) ×2 IMPLANT
COVER SURGICAL LIGHT HANDLE (MISCELLANEOUS) ×2 IMPLANT
CUFF TOURN SGL QUICK 34 (TOURNIQUET CUFF) ×2
CUFF TRNQT CYL 34X4X40X1 (TOURNIQUET CUFF) ×1 IMPLANT
DECANTER SPIKE VIAL GLASS SM (MISCELLANEOUS) ×2 IMPLANT
DRAPE U-SHAPE 47X51 STRL (DRAPES) ×2 IMPLANT
DRILL PIN HEADLESS TROCAR 3X75 (PIN) ×4 IMPLANT
DRSG ADAPTIC 3X8 NADH LF (GAUZE/BANDAGES/DRESSINGS) ×2 IMPLANT
DRSG PAD ABDOMINAL 8X10 ST (GAUZE/BANDAGES/DRESSINGS) ×2 IMPLANT
DURAPREP 26ML APPLICATOR (WOUND CARE) ×2 IMPLANT
ELECT REM PT RETURN 15FT ADLT (MISCELLANEOUS) ×2 IMPLANT
GAUZE SPONGE 4X4 12PLY STRL (GAUZE/BANDAGES/DRESSINGS) ×2 IMPLANT
GLOVE BIO SURGEON STRL SZ7 (GLOVE) ×2 IMPLANT
GLOVE BIO SURGEON STRL SZ8 (GLOVE) ×2 IMPLANT
GLOVE BIOGEL PI IND STRL 6.5 (GLOVE) ×1 IMPLANT
GLOVE BIOGEL PI IND STRL 7.0 (GLOVE) ×1 IMPLANT
GLOVE BIOGEL PI IND STRL 7.5 (GLOVE) ×3 IMPLANT
GLOVE BIOGEL PI IND STRL 8 (GLOVE) ×2 IMPLANT
GLOVE BIOGEL PI INDICATOR 6.5 (GLOVE) ×1
GLOVE BIOGEL PI INDICATOR 7.0 (GLOVE) ×1
GLOVE BIOGEL PI INDICATOR 7.5 (GLOVE) ×3
GLOVE BIOGEL PI INDICATOR 8 (GLOVE) ×2
GLOVE SURG SS PI 6.5 STRL IVOR (GLOVE) ×2 IMPLANT
GOWN SPEC L4 XLG W/TWL (GOWN DISPOSABLE) ×2 IMPLANT
GOWN STRL REUS W/ TWL XL LVL3 (GOWN DISPOSABLE) ×1 IMPLANT
GOWN STRL REUS W/TWL LRG LVL3 (GOWN DISPOSABLE) ×4 IMPLANT
GOWN STRL REUS W/TWL XL LVL3 (GOWN DISPOSABLE) ×4 IMPLANT
HANDPIECE INTERPULSE COAX TIP (DISPOSABLE) ×2
IMMOBILIZER KNEE 20 (SOFTGOODS) ×2
IMMOBILIZER KNEE 20 THIGH 36 (SOFTGOODS) ×1 IMPLANT
INSERTER TIP PARTIAL KNEE (Miscellaneous) ×2 IMPLANT
KNEE PSN PK CMT FEM RM SZ 5 (Orthopedic Implant) ×2 IMPLANT
KNEE PSN PK CMT TIB RM SZ G (Orthopedic Implant) ×2 IMPLANT
KNEE PSN PK VE PLY RM SZ G 8 (Orthopedic Implant) ×2 IMPLANT
MANIFOLD NEPTUNE II (INSTRUMENTS) ×2 IMPLANT
NDL SAFETY ECLIPSE 18X1.5 (NEEDLE) IMPLANT
NEEDLE HYPO 18GX1.5 SHARP (NEEDLE)
PACK TOTAL KNEE CUSTOM (KITS) ×2 IMPLANT
PAD ABD 8X10 STRL (GAUZE/BANDAGES/DRESSINGS) ×2 IMPLANT
PADDING CAST COTTON 6X4 STRL (CAST SUPPLIES) ×2 IMPLANT
POSITIONER SURGICAL ARM (MISCELLANEOUS) ×2 IMPLANT
SCREW HEADED 33MM KNEE (Screw) ×4 IMPLANT
SCREW HEADED 48MM KNEE (Screw) ×2 IMPLANT
SET HNDPC FAN SPRY TIP SCT (DISPOSABLE) ×1 IMPLANT
STRIP CLOSURE SKIN 1/2X4 (GAUZE/BANDAGES/DRESSINGS) ×2 IMPLANT
SUT MNCRL AB 4-0 PS2 18 (SUTURE) ×2 IMPLANT
SUT STRATAFIX 0 PDS 27 VIOLET (SUTURE) ×2
SUT VIC AB 2-0 CT1 27 (SUTURE) ×4
SUT VIC AB 2-0 CT1 TAPERPNT 27 (SUTURE) ×2 IMPLANT
SUTURE STRATFX 0 PDS 27 VIOLET (SUTURE) ×1 IMPLANT
SYR 50ML LL SCALE MARK (SYRINGE) ×2 IMPLANT
WRAP KNEE MAXI GEL POST OP (GAUZE/BANDAGES/DRESSINGS) ×2 IMPLANT
YANKAUER SUCT BULB TIP 10FT TU (MISCELLANEOUS) ×2 IMPLANT

## 2017-11-14 NOTE — Anesthesia Procedure Notes (Signed)
Procedure Name: Intubation Date/Time: 11/14/2017 4:11 PM Performed by: Maxwell Caul, CRNA Pre-anesthesia Checklist: Patient identified, Emergency Drugs available, Suction available and Patient being monitored Patient Re-evaluated:Patient Re-evaluated prior to induction Oxygen Delivery Method: Circle system utilized Preoxygenation: Pre-oxygenation with 100% oxygen Induction Type: IV induction Ventilation: Mask ventilation without difficulty and Oral airway inserted - appropriate to patient size Laryngoscope Size: Mac and 4 Grade View: Grade II Tube type: Oral Tube size: 7.5 mm Number of attempts: 1 Airway Equipment and Method: Stylet and Oral airway Placement Confirmation: ETT inserted through vocal cords under direct vision,  positive ETCO2 and breath sounds checked- equal and bilateral Secured at: 21 cm Tube secured with: Tape Dental Injury: Teeth and Oropharynx as per pre-operative assessment

## 2017-11-14 NOTE — Interval H&P Note (Signed)
History and Physical Interval Note:  11/14/2017 2:43 PM  Henry Martin  has presented today for surgery, with the diagnosis of right knee medial compartment OA  The various methods of treatment have been discussed with the patient and family. After consideration of risks, benefits and other options for treatment, the patient has consented to  Procedure(s): Right knee medial unicompartmental arthroplasty (Right) as a surgical intervention .  The patient's history has been reviewed, patient examined, no change in status, stable for surgery.  I have reviewed the patient's chart and labs.  Questions were answered to the patient's satisfaction.     Pilar Plate Shirlene Andaya

## 2017-11-14 NOTE — Transfer of Care (Signed)
Immediate Anesthesia Transfer of Care Note  Patient: Henry Martin  Procedure(s) Performed: Right knee medial unicompartmental arthroplasty (Right Knee)  Patient Location: PACU  Anesthesia Type:General  Level of Consciousness: awake, alert , oriented and patient cooperative  Airway & Oxygen Therapy: Patient Spontanous Breathing and Patient connected to face mask oxygen  Post-op Assessment: Report given to RN, Post -op Vital signs reviewed and stable and Patient moving all extremities X 4  Post vital signs: stable  Last Vitals:  Vitals Value Taken Time  BP 163/78 11/14/2017  5:54 PM  Temp    Pulse 90 11/14/2017  5:58 PM  Resp 8 11/14/2017  5:58 PM  SpO2 98 % 11/14/2017  5:58 PM  Vitals shown include unvalidated device data.  Last Pain:  Vitals:   11/14/17 1401  TempSrc:   PainSc: 0-No pain         Complications: No apparent anesthesia complications

## 2017-11-14 NOTE — Anesthesia Procedure Notes (Addendum)
Anesthesia Regional Block: Adductor canal block   Pre-Anesthetic Checklist: ,, timeout performed, Correct Patient, Correct Site, Correct Laterality, Correct Procedure, Correct Position, site marked, Risks and benefits discussed,  Surgical consent,  Pre-op evaluation,  At surgeon's request and post-op pain management  Laterality: Right  Prep: chloraprep       Needles:   Needle Type: Stimulator Needle - 80          Additional Needles:   Procedures: Doppler guided,,,, ultrasound used (permanent image in chart),,,,  Narrative:  Start time: 11/14/2017 3:05 PM End time: 11/14/2017 3:25 PM Injection made incrementally with aspirations every 5 mL.  Performed by: Personally  Anesthesiologist: Belinda Block, MD

## 2017-11-14 NOTE — Op Note (Signed)
OPERATIVE REPORT-UNICOMPARTMENTAL ARTHROPLASTY  PREOPERATIVE DIAGNOSIS: Medial compartment osteoarthritis, Rightt knee  POSTOPERATIVE DIAGNOSIS: Medial compartment osteoarthritis, Right knee  PROCEDURE:Right knee medial unicompartmental arthroplasty. ([facility])  SURGEON: Gaynelle Arabian, MD   ASSISTANT: Theresa Duty, PA-C  ANESTHESIA:  Adductor canal block and General.   ESTIMATED BLOOD LOSS: Minimal.   DRAINS: Hemovac x1.   TOURNIQUET TIME:  Total Tourniquet Time Documented: Thigh (Right) - 45 minutes Total: Thigh (Right) - 45 minutes   COMPLICATIONS: None.   CONDITION: Stable to recovery.   BRIEF CLINICAL NOTE:  Henry Martin is a 77 y.o. male , who has  significant isolated medial compartment arthritis of the Right knee. The patient has had nonoperative management including injections of cortisone and viscous supplements. Unfortunately, the pain persists.  Radiograph showed isolated medial compartment bone-on-bone arthritis  with normal-appearing patellofemoral and lateral compartments. The patient presents now for Right knee unicompartmental arthroplasty.   PROCEDURE IN DETAIL: After successful administration of  Adductor canal block and spinal anesthetic, a tourniquet was placed high on the  Right thigh and the Right lower extremity prepped and draped in usual sterile fashion. Extremity was wrapped in an Esmarch, knee flexed, and tourniquet inflated to 300 mmHg.       A midline incision was made with a 10 blade through subcutaneous  tissue to the extensor mechanism. A fresh blade was used to make a  medial parapatellar arthrotomy. Soft tissue on the proximal medial  tibia subperiosteally elevated to the joint line with a knife and into  the semimembranosus bursa with a Cobb elevator. The patella was  subluxed laterally, and the knee flexed 90 degrees. The ACL was intact.  The marginal osteophytes on the medial femur and tibia were removed with  a rongeur. The  medial meniscus was also removed. The extramedullary tibial cutting guide was placed referencing Proximally at the medial aspect of the tibial tubercle and distally along the 2nd metatarsal axis. 4 degrees of posterior slope was dialed in and the block was pinned to remove 4 mm from the medial tibial surface.The cut is made with an oscillating saw and the cut bone removed.      The 8 mm spacer was then placed with the knee in extension for a stable fit in extension. The distal femoral cutting guide was attached to the spacer in extension and pinned to make the distal femur cut with an oscillating saw.  The trial sizers were placed and size 5 is most appropriate. It is pinned into place and the posterior and chamfer cuts are made through the cutting guide. The two lug holes are also drilled through the guide.The size 5 trial is placed with excellent fit. An 8  mm spacer is placed and there is excellent balance through full range of motion.  The trial and the spacer are removed and tibia addressed.      The tibial sizer is placed and size G is most appropriate. The proximal tibia is prepared with the drill holes and keel for the size G. The size G implant is placed with excellent fit. The trials are removed and cut bone surfaces prepared with pulsatile lavage. The cement is mixed and once ready for implantation The size G tibia and size 5 femur are cemented into place and all extruded cement removed. The 8 mm insert is then placed into the tibial tray  and locked into position. The knee is placed through a full range of motion with excellent stability.  I then injected the extensor mechanism, periosteum of  the femur and subcu tissues, a total of 20 mL of Exparel mixed with 30  mL of saline. Wound was copiously irrigated with saline solution, and the arthrotomy closed  with a running #0 Stratofix  suture. The subcutaneous was closed with  interrupted 2-0 Vicryl and subcuticular running 4-0  Monocryl. The drain  was hooked to suction. Incision cleaned and dried and Steri-Strips and  a bulky sterile dressing applied. The tourniquet was released after a  total time of 45 minutes. This was done after closing the extensor  mechanism. The wound was closed and a bulky sterile dressing was  applied. The operative limb was placed into a knee immobilizer, and the patient awakened and transported to recovery room in stable condition.       Please note that a surgical assistant was a medical necessity for this  procedure in order to perform it in a safe and expeditious manner.  Assistance was necessary for retracting vital ligaments, neurovascular  structures, as well as for proper positioning of the limb to allow for  appropriate bone cuts and appropriate placement of the prosthesis.    Dione Plover Giovoni Bunch, MD

## 2017-11-14 NOTE — Plan of Care (Signed)
  Problem: Health Behavior/Discharge Planning: Goal: Ability to manage health-related needs will improve Outcome: Progressing   Problem: Clinical Measurements: Goal: Ability to maintain clinical measurements within normal limits will improve Outcome: Progressing   Problem: Clinical Measurements: Goal: Diagnostic test results will improve Outcome: Progressing   

## 2017-11-14 NOTE — Progress Notes (Signed)
Assisted Dr. Nyoka Cowden with right, adductor canal block. Side rails up, monitors on throughout procedure. See vital signs in flow sheet. Tolerated Procedure well.

## 2017-11-14 NOTE — Anesthesia Preprocedure Evaluation (Addendum)
Anesthesia Evaluation  Patient identified by MRN, date of birth, ID band Patient awake    Reviewed: Allergy & Precautions, NPO status , Patient's Chart, lab work & pertinent test results  Airway Mallampati: II  TM Distance: >3 FB     Dental   Pulmonary sleep apnea , former smoker,    breath sounds clear to auscultation       Cardiovascular hypertension, + CAD   Rhythm:Regular Rate:Normal     Neuro/Psych    GI/Hepatic Neg liver ROS, GERD  ,  Endo/Other  diabetes  Renal/GU Renal disease     Musculoskeletal   Abdominal   Peds  Hematology   Anesthesia Other Findings   Reproductive/Obstetrics                             Anesthesia Physical Anesthesia Plan  ASA: III  Anesthesia Plan: General   Post-op Pain Management:  Regional for Post-op pain   Induction: Intravenous  PONV Risk Score and Plan: Ondansetron, Dexamethasone and Midazolam  Airway Management Planned: Oral ETT  Additional Equipment:   Intra-op Plan:   Post-operative Plan: Extubation in OR  Informed Consent: I have reviewed the patients History and Physical, chart, labs and discussed the procedure including the risks, benefits and alternatives for the proposed anesthesia with the patient or authorized representative who has indicated his/her understanding and acceptance.   Dental advisory given  Plan Discussed with: CRNA and Anesthesiologist  Anesthesia Plan Comments:       Anesthesia Quick Evaluation

## 2017-11-14 NOTE — Anesthesia Postprocedure Evaluation (Signed)
Anesthesia Post Note  Patient: Henry Martin  Procedure(s) Performed: Right knee medial unicompartmental arthroplasty (Right Knee)     Patient location during evaluation: PACU Anesthesia Type: General Level of consciousness: awake Pain management: pain level controlled Vital Signs Assessment: post-procedure vital signs reviewed and stable Respiratory status: spontaneous breathing Cardiovascular status: stable Postop Assessment: no apparent nausea or vomiting Anesthetic complications: no    Last Vitals:  Vitals:   11/14/17 1800 11/14/17 1815  BP: (!) 147/72 (!) 154/70  Pulse: 92 92  Resp: 16 16  Temp:  36.4 C  SpO2: 98% 96%    Last Pain:  Vitals:   11/14/17 1815  TempSrc:   PainSc: 0-No pain                 Wynne Rozak

## 2017-11-14 NOTE — Progress Notes (Signed)
Per Md rx, this RT spoke with pt regarding use of cpap.  Pt stated he has a cpap machine at home but has not used it in years.  Pt prefers to remain on nasal cannula tonight, currently at 2lpm.  Pt was encouraged to call should he change his mind.  Rx placed for cpap prn for OSA per RT protocol.

## 2017-11-15 DIAGNOSIS — M1711 Unilateral primary osteoarthritis, right knee: Secondary | ICD-10-CM | POA: Diagnosis not present

## 2017-11-15 LAB — BASIC METABOLIC PANEL
Anion gap: 11 (ref 5–15)
BUN: 17 mg/dL (ref 8–23)
CALCIUM: 8.9 mg/dL (ref 8.9–10.3)
CO2: 24 mmol/L (ref 22–32)
CREATININE: 1.2 mg/dL (ref 0.61–1.24)
Chloride: 104 mmol/L (ref 98–111)
GFR calc Af Amer: >60 mL/min (ref >60)
GFR, EST NON AFRICAN AMERICAN: 57 mL/min — AB (ref >60)
Glucose, Bld: 205 mg/dL — ABNORMAL HIGH (ref 70–99)
Potassium: 4.1 mmol/L (ref 3.5–5.1)
Sodium: 139 mmol/L (ref 135–145)

## 2017-11-15 LAB — CBC
HCT: 36.2 % — ABNORMAL LOW (ref 39.0–52.0)
Hemoglobin: 11.9 g/dL — ABNORMAL LOW (ref 13.0–17.0)
MCH: 28.7 pg (ref 26.0–34.0)
MCHC: 32.9 g/dL (ref 30.0–36.0)
MCV: 87.4 fL (ref 78.0–100.0)
PLATELETS: 209 10*3/uL (ref 150–400)
RBC: 4.14 MIL/uL — ABNORMAL LOW (ref 4.22–5.81)
RDW: 12.8 % (ref 11.5–15.5)
WBC: 10.8 10*3/uL — ABNORMAL HIGH (ref 4.0–10.5)

## 2017-11-15 LAB — GLUCOSE, CAPILLARY
GLUCOSE-CAPILLARY: 160 mg/dL — AB (ref 70–99)
GLUCOSE-CAPILLARY: 134 mg/dL — AB (ref 70–99)

## 2017-11-15 MED ORDER — OXYCODONE HCL 5 MG PO TABS: 5.0000 mg | ORAL_TABLET | Freq: Four times a day (QID) | ORAL | 0 refills | Status: DC | PRN

## 2017-11-15 MED ORDER — METHOCARBAMOL 500 MG PO TABS: 500.0000 mg | ORAL_TABLET | Freq: Four times a day (QID) | ORAL | 0 refills | Status: DC | PRN

## 2017-11-15 NOTE — Plan of Care (Signed)
Patient discharged home in stable condition. Discharge instructions given to patient and wife, both verbalized understanding. Rx given.

## 2017-11-15 NOTE — Progress Notes (Signed)
   Subjective: 1 Day Post-Op Procedure(s) (LRB): Right knee medial unicompartmental arthroplasty (Right) Patient reports pain as mild.   Patient seen in rounds with Dr. Wynelle Link. Patient is well, and has had no acute complaints or problems other than mild discomfort in the right knee. States he is ready to go home. No issues overnight. Denies chest pain, SOB or calf pain. Voiding without difficulty. Positive flatus.  We will start therapy today.   Objective: Vital signs in last 24 hours: Temp:  [97.3 F (36.3 C)-98.4 F (36.9 C)] 98.2 F (36.8 C) (09/12 0514) Pulse Rate:  [65-101] 93 (09/12 0514) Resp:  [10-20] 20 (09/12 0514) BP: (111-163)/(50-78) 117/58 (09/12 0514) SpO2:  [93 %-100 %] 95 % (09/12 0514) Weight:  [107 kg] 107 kg (09/11 1401)  Intake/Output from previous day:  Intake/Output Summary (Last 24 hours) at 11/15/2017 0738 Last data filed at 11/15/2017 0600 Gross per 24 hour  Intake 3663.26 ml  Output 980 ml  Net 2683.26 ml    Labs: Recent Labs    11/15/17 0446  HGB 11.9*   Recent Labs    11/15/17 0446  WBC 10.8*  RBC 4.14*  HCT 36.2*  PLT 209   Recent Labs    11/15/17 0446  NA 139  K 4.1  CL 104  CO2 24  BUN 17  CREATININE 1.20  GLUCOSE 205*  CALCIUM 8.9   Recent Labs    11/14/17 1803  INR 1.09    Exam: General - Patient is Alert and Oriented Extremity - Neurologically intact Neurovascular intact Sensation intact distally Dorsiflexion/Plantar flexion intact Dressing - dressing C/D/I Motor Function - intact, moving foot and toes well on exam.   Past Medical History:  Diagnosis Date  . Atrial fibrillation with rapid ventricular response ([place]) 07/04/11   Xarelto started 4/13;    . CAD (coronary artery disease)    mild nonobstructive by cath '05 (ASTEROID trial - mLAD 30%, CFX 20-30%, OM 20%, mRCA 30%, normal LVF), EF 65% by echo '12;    . Complication of anesthesia    "bowels fall asleep & he can't have BM" ;  "slow to wake"   .  Diabetes mellitus without complication ([place])   . GERD (gastroesophageal reflux disease) 07/04/11   "once in awhile; haven't been treated for it"  . HLD (hyperlipidemia)   . HTN (hypertension)   . OSA (obstructive sleep apnea)    "suppose to be wearing my mask"  . Renal cell cancer, right ([place]) 2000   s/p partial neprhectomy   . Renal cell carcinoma   . Skin cancer 06/2011   left ear  . Stroke [facility]) 2007   denies residual    Assessment/Plan: 1 Day Post-Op Procedure(s) (LRB): Right knee medial unicompartmental arthroplasty (Right) Principal Problem:   OA (osteoarthritis) of knee  Estimated body mass index is 32.01 kg/m as calculated from the following:   Height as of this encounter: 6' (1.829 m).   Weight as of this encounter: 107 kg. Advance diet Up with therapy D/C IV fluids   DVT Prophylaxis - Xarelto Weight bearing as tolerated. D/C O2 and pulse ox and try on room air. Will begin therapy today.  Plan is to go Home after hospital stay. If meeting goals with therapy, may discharge today around lunchtime. Otherwise will stay for two sessions of PT. Scheduled for outpatient physical therapy at [facility]. Follow-up in the office in 2 weeks with Dr. Wynelle Link.  Theresa Duty, PA-C Orthopedic Surgery 11/15/2017, 7:38 AM

## 2017-11-15 NOTE — Evaluation (Signed)
Physical Therapy Evaluation Patient Details Name: Henry Martin MRN: [id number] DOB: 13-Aug-1940 Today's Date: 11/15/2017   History of Present Illness  s/p  R UKA; HX: L UKA  Clinical Impression  Pt is s/p UKA resulting in the deficits listed below (see PT Problem List).  Pt will benefit from skilled PT to increase their independence and safety with mobility to allow discharge to the venue listed below.    Follow Up Recommendations Follow surgeon's recommendation for DC plan and follow-up therapies    Equipment Recommendations  None recommended by PT    Recommendations for Other Services       Precautions / Restrictions Precautions Precautions: Knee Restrictions Weight Bearing Restrictions: No Other Position/Activity Restrictions: WBAT      Mobility  Bed Mobility Overal bed mobility: Needs Assistance Bed Mobility: Supine to Sit     Supine to sit: Min guard     General bed mobility comments: for safety  Transfers Overall transfer level: Needs assistance Equipment used: Rolling walker (2 wheeled) Transfers: Sit to/from Stand Sit to Stand: Min guard         General transfer comment: cues for hand placement  Ambulation/Gait Ambulation/Gait assistance: Min guard Gait Distance (Feet): 220 Feet Assistive device: Rolling walker (2 wheeled) Gait Pattern/deviations: Step-to pattern;Step-through pattern     General Gait Details: cues for RW safety, gait progression  Stairs Stairs: Yes Stairs assistance: Min guard Stair Management: Step to pattern;Backwards;Forwards;With walker;With crutches;No rails Number of Stairs: 8 General stair comments: cues for sequence and technique, once with crutches, once with RW posterior technique, wife present  Wheelchair Mobility    Modified Rankin (Stroke Patients Only)       Balance Overall balance assessment: Mild deficits observed, not formally tested                                            Pertinent Vitals/Pain Pain Assessment: No/denies pain    Home Living Family/patient expects to be discharged to:: Private residence Living Arrangements: Spouse/significant other   Type of Home: House Home Access: Stairs to enter Entrance Stairs-Rails: None Technical brewer of Steps: 3 Home Layout: One level Home Equipment: Environmental consultant - 2 wheels      Prior Function Level of Independence: Independent               Hand Dominance        Extremity/Trunk Assessment   Upper Extremity Assessment Upper Extremity Assessment: Overall WFL for tasks assessed    Lower Extremity Assessment Lower Extremity Assessment: RLE deficits/detail RLE Deficits / Details: right knee flexion to 90* AAROM, strength 3/5, limited by post op pain        Communication   Communication: HOH  Cognition Arousal/Alertness: Awake/alert Behavior During Therapy: WFL for tasks assessed/performed Overall Cognitive Status: Within Functional Limits for tasks assessed                                        General Comments      Exercises Total Joint Exercises Ankle Circles/Pumps: AROM;Both;10 reps Quad Sets: AROM;10 reps;Both Knee Flexion: AROM;Right;Seated   Assessment/Plan    PT Assessment Patient needs continued PT services  PT Problem List Decreased strength;Decreased range of motion;Decreased activity tolerance;Decreased mobility;Decreased knowledge of use of DME  PT Treatment Interventions Functional mobility training;Gait training;DME instruction;Therapeutic activities;Therapeutic exercise;Patient/family education;Stair training    PT Goals (Current goals can be found in the Care Plan section)  Acute Rehab PT Goals PT Goal Formulation: With patient Time For Goal Achievement: 11/22/17 Potential to Achieve Goals: Good    Frequency 7X/week   Barriers to discharge        Co-evaluation               AM-PAC PT "6 Clicks" Daily Activity   Outcome Measure Difficulty turning over in bed (including adjusting bedclothes, sheets and blankets)?: A Little Difficulty moving from lying on back to sitting on the side of the bed? : A Little Difficulty sitting down on and standing up from a chair with arms (e.g., wheelchair, bedside commode, etc,.)?: A Little Help needed moving to and from a bed to chair (including a wheelchair)?: A Little Help needed walking in hospital room?: A Little Help needed climbing 3-5 steps with a railing? : A Little 6 Click Score: 18    End of Session Equipment Utilized During Treatment: Gait belt Activity Tolerance: Patient tolerated treatment well Patient left: in chair;with call bell/phone within reach;with family/visitor present;with chair alarm set        Time: 3762-8315 PT Time Calculation (min) (ACUTE ONLY): 33 min   Charges:   PT Evaluation $PT Eval Low Complexity: 1 Low PT Treatments $Gait Training: 8-22 mins        Kenyon Ana, PT Pager: [phone] 11/15/2017   [facility] [phone]   [facility] 11/15/2017, 12:30 PM

## 2017-11-15 NOTE — Care Management Note (Signed)
Case Management Note  Patient Details  Name: COHAN STIPES MRN: [id number] Date of Birth: 01-25-1941  Subjective/Objective:  From home w/spouse. D/c plan home with otpt PT. No further CM needs.                  Action/Plan:d/c home.   Expected Discharge Date:  11/15/17               Expected Discharge Plan:  OP Rehab  In-House Referral:     Discharge planning Services  CM Consult  Post Acute Care Choice:  Durable Medical Equipment(rw) Choice offered to:     DME Arranged:    DME Agency:     HH Arranged:    HH Agency:     Status of Service:  Completed, signed off  If discussed at [employer] of Stay Meetings, dates discussed:    Additional Comments:  Dessa Phi, RN 11/15/2017, 12:04 PM

## 2017-11-15 NOTE — Discharge Instructions (Signed)
Dr. Gaynelle Arabian Total Joint Specialist [facility] [address], [place], [place] [address] [phone]  UNI KNEE REPLACEMENT POSTOPERATIVE DIRECTIONS  Knee Rehabilitation, Guidelines Following Surgery  Results after knee surgery are often greatly improved when you follow the exercise, range of motion and muscle strengthening exercises prescribed by your doctor. Safety measures are also important to protect the knee from further injury. Any time any of these exercises cause you to have increased pain or swelling in your knee joint, decrease the amount until you are comfortable again and slowly increase them. If you have problems or questions, call your caregiver or physical therapist for advice.   HOME CARE INSTRUCTIONS   Remove items at home which could result in a fall. This includes throw rugs or furniture in walking pathways.   ICE to the affected knee every three hours for 30 minutes at a time and then as needed for pain and swelling.  Continue to use ice on the knee for pain and swelling from surgery. You may notice swelling that will progress down to the foot and ankle.  This is normal after surgery.  Elevate the leg when you are not up walking on it.    Continue to use the breathing machine which will help keep your temperature down.  It is common for your temperature to cycle up and down following surgery, especially at night when you are not up moving around and exerting yourself.  The breathing machine keeps your lungs expanded and your temperature down.  Do not place pillow under knee, focus on keeping the knee straight while resting  DIET You may resume your previous home diet once your are discharged from the hospital.  DRESSING / WOUND CARE / SHOWERING You may shower 3 days after surgery, but keep the wounds dry during showering.  You may use an occlusive plastic wrap (Press'n Seal for example), NO SOAKING/SUBMERGING IN THE BATHTUB.  If the bandage gets  wet, change with a clean dry gauze.  If the incision gets wet, pat the wound dry with a clean towel. Leave the surgical dressing on the knee for 48 hours.  May remove the dressing on the second day.  Remove the Ace Wrap along with the cotton padding.  Leave the steri-strip bandaids in place along the incision on the skin.  Cover the incision each day with some dry gauze and paper tape. You may start showering once you are discharged home but do not submerge the incision under water. Just pat the incision dry and apply a dry gauze dressing on daily. Change the surgical dressing daily and reapply a dry dressing each time.  ACTIVITY Walk with your walker as instructed. Use walker as long as suggested by your caregivers. Avoid periods of inactivity such as sitting longer than an hour when not asleep. This helps prevent blood clots.  You may resume a sexual relationship in one month or when given the OK by your doctor.  You may return to work once you are cleared by your doctor.  Do not drive a car for 6 weeks or until released by you surgeon.  Do not drive while taking narcotics.  WEIGHT BEARING Weight bearing as tolerated with assist device (walker, cane, etc) as directed, use it as long as suggested by your surgeon or therapist, typically at least 4-6 weeks.  POSTOPERATIVE CONSTIPATION PROTOCOL Constipation - defined medically as fewer than three stools per week and severe constipation as less than one stool per week.  One of the most common issues patients have following surgery is constipation.  Even if you have a regular bowel pattern at home, your normal regimen is likely to be disrupted due to multiple reasons following surgery.  Combination of anesthesia, postoperative narcotics, change in appetite and fluid intake all can affect your bowels.  In order to avoid complications following surgery, here are some recommendations in order to help you during your recovery period.  Colace (docusate)  - Pick up an over-the-counter form of Colace or another stool softener and take twice a day as long as you are requiring postoperative pain medications.  Take with a full glass of water daily.  If you experience loose stools or diarrhea, hold the colace until you stool forms back up.  If your symptoms do not get better within 1 week or if they get worse, check with your doctor.  Dulcolax (bisacodyl) - Pick up over-the-counter and take as directed by the product packaging as needed to assist with the movement of your bowels.  Take with a full glass of water.  Use this product as needed if not relieved by Colace only.   MiraLax (polyethylene glycol) - Pick up over-the-counter to have on hand.  MiraLax is a solution that will increase the amount of water in your bowels to assist with bowel movements.  Take as directed and can mix with a glass of water, juice, soda, coffee, or tea.  Take if you go more than two days without a movement. Do not use MiraLax more than once per day. Call your doctor if you are still constipated or irregular after using this medication for 7 days in a row.  If you continue to have problems with postoperative constipation, please contact the office for further assistance and recommendations.  If you experience "the worst abdominal pain ever" or develop nausea or vomiting, please contact the office immediatly for further recommendations for treatment.  ITCHING  If you experience itching with your medications, try taking only a single pain pill, or even half a pain pill at a time.  You can also use Benadryl over the counter for itching or also to help with sleep.   TED HOSE STOCKINGS Wear the elastic stockings on both legs for three weeks following surgery during the day but you may remove then at night for sleeping.  MEDICATIONS See your medication summary on the After Visit Summary that the nursing staff will review with you prior to discharge.  You may have some home  medications which will be placed on hold until you complete the course of blood thinner medication.  It is important for you to complete the blood thinner medication as prescribed by your surgeon.  Continue your approved medications as instructed at time of discharge.  PRECAUTIONS If you experience chest pain or shortness of breath - call 911 immediately for transfer to the hospital emergency department.  If you develop a fever greater that 101 F, purulent drainage from wound, increased redness or drainage from wound, foul odor from the wound/dressing, or calf pain - CONTACT YOUR SURGEON.                                                   FOLLOW-UP APPOINTMENTS Make sure you keep all of your appointments after your operation with your surgeon and caregivers. You should call  the office at the above phone number and make an appointment for approximately two weeks after the date of your surgery or on the date instructed by your surgeon outlined in the "After Visit Summary".  RANGE OF MOTION AND STRENGTHENING EXERCISES  Rehabilitation of the knee is important following a knee injury or an operation. After just a few days of immobilization, the muscles of the thigh which control the knee become weakened and shrink (atrophy). Knee exercises are designed to build up the tone and strength of the thigh muscles and to improve knee motion. Often times heat used for twenty to thirty minutes before working out will loosen up your tissues and help with improving the range of motion but do not use heat for the first two weeks following surgery. These exercises can be done on a training (exercise) mat, on the floor, on a table or on a bed. Use what ever works the best and is most comfortable for you Knee exercises include:   Leg Lifts - While your knee is still immobilized in a splint or cast, you can do straight leg raises. Lift the leg to 60 degrees, hold for 3 sec, and slowly lower the leg. Repeat 10-20 times 2-3  times daily. Perform this exercise against resistance later as your knee gets better.   Quad and Hamstring Sets - Tighten up the muscle on the front of the thigh (Quad) and hold for 5-10 sec. Repeat this 10-20 times hourly. Hamstring sets are done by pushing the foot backward against an object and holding for 5-10 sec. Repeat as with quad sets.   Leg Slides: Lying on your back, slowly slide your foot toward your buttocks, bending your knee up off the floor (only go as far as is comfortable). Then slowly slide your foot back down until your leg is flat on the floor again.  Angel Wings: Lying on your back spread your legs to the side as far apart as you can without causing discomfort.  A rehabilitation program following serious knee injuries can speed recovery and prevent re-injury in the future due to weakened muscles. Contact your doctor or a physical therapist for more information on knee rehabilitation.   IF YOU ARE TRANSFERRED TO A SKILLED REHAB FACILITY If the patient is transferred to a skilled rehab facility following release from the hospital, a list of the current medications will be sent to the facility for the patient to continue.  When discharged from the skilled rehab facility, please have the facility set up the patient's [place] prior to being released. Also, the skilled facility will be responsible for providing the patient with their medications at time of release from the facility to include their pain medication, the muscle relaxants, and their blood thinner medication. If the patient is still at the rehab facility at time of the two week follow up appointment, the skilled rehab facility will also need to assist the patient in arranging follow up appointment in our office and any transportation needs.  MAKE SURE YOU:   Understand these instructions.   Get help right away if you are not doing well or get worse.    Pick up stool softner and laxative for home  use following surgery while on pain medications. Do not submerge incision under water. Please use good hand washing techniques while changing dressing each day. May shower starting three days after surgery. Please use a clean towel to pat the incision dry following showers. Continue to use ice  for pain and swelling after surgery. Do not use any lotions or creams on the incision until instructed by your surgeon.

## 2017-11-15 NOTE — Progress Notes (Signed)
Physical Therapy Treatment Patient Details Name: Henry Martin MRN: [id number] DOB: May 11, 1940 Today's Date: 11/15/2017    History of Present Illness s/p  R UKA; HX: L UKA    PT Comments    Pt progressing well, feels ready for d/c home   Follow Up Recommendations  Follow surgeon's recommendation for DC plan and follow-up therapies     Equipment Recommendations  None recommended by PT    Recommendations for Other Services       Precautions / Restrictions Precautions Precautions: Knee Restrictions Weight Bearing Restrictions: No Other Position/Activity Restrictions: WBAT    Mobility  Bed Mobility Overal bed mobility: Needs Assistance Bed Mobility: Supine to Sit     Supine to sit: Min guard     General bed mobility comments: in chair  Transfers Overall transfer level: Needs assistance Equipment used: Rolling walker (2 wheeled) Transfers: Sit to/from Stand Sit to Stand: Supervision         General transfer comment: cues for hand placement  Ambulation/Gait Ambulation/Gait assistance: Supervision Gait Distance (Feet): 120 Feet Assistive device: Rolling walker (2 wheeled) Gait Pattern/deviations: Step-to pattern;Step-through pattern     General Gait Details: cues for RW safety, gait progression   Stairs Stairs: Yes Stairs assistance: Min guard Stair Management: Step to pattern;Backwards;Forwards;With walker;With crutches;No rails Number of Stairs: 8 General stair comments: cues for sequence and technique, once with crutches, once with RW posterior technique, wife present   Wheelchair Mobility    Modified Rankin (Stroke Patients Only)       Balance Overall balance assessment: Mild deficits observed, not formally tested                                          Cognition Arousal/Alertness: Awake/alert Behavior During Therapy: WFL for tasks assessed/performed Overall Cognitive Status: Within Functional Limits for tasks  assessed                                        Exercises Total Joint Exercises Ankle Circles/Pumps: AROM;Both;10 reps Quad Sets: AROM;10 reps;Both Heel Slides: AAROM;Right;10 reps Hip ABduction/ADduction: AROM;10 reps;Right Straight Leg Raises: AROM;Strengthening;Right;10 reps Knee Flexion: AROM;Right;Seated Goniometric ROM: grossly -6* to 90* flexion AAROm right knee    General Comments        Pertinent Vitals/Pain Pain Assessment: No/denies pain(discomfort with end range flexion)    Home Living                      Prior Function            PT Goals (current goals can now be found in the care plan section) Acute Rehab PT Goals PT Goal Formulation: With patient Time For Goal Achievement: 11/22/17 Potential to Achieve Goals: Good Progress towards PT goals: Progressing toward goals    Frequency    7X/week      PT Plan Current plan remains appropriate    Co-evaluation              AM-PAC PT "6 Clicks" Daily Activity  Outcome Measure  Difficulty turning over in bed (including adjusting bedclothes, sheets and blankets)?: A Little Difficulty moving from lying on back to sitting on the side of the bed? : A Little Difficulty sitting down on and standing up from a chair with arms (e.g.,  wheelchair, bedside commode, etc,.)?: A Little Help needed moving to and from a bed to chair (including a wheelchair)?: A Little Help needed walking in hospital room?: A Little Help needed climbing 3-5 steps with a railing? : A Little 6 Click Score: 18    End of Session Equipment Utilized During Treatment: Gait belt Activity Tolerance: Patient tolerated treatment well Patient left: with call bell/phone within reach;with family/visitor present;in chair   PT Visit Diagnosis: Difficulty in walking, not elsewhere classified (R26.2)     Time: 6219-4712 PT Time Calculation (min) (ACUTE ONLY): 29 min  Charges:  $Gait Training: 8-22  mins $Therapeutic Exercise: 8-22 mins                     Kenyon Ana, PT Pager: [phone] 11/15/2017   [facility] [phone]    [facility] 11/15/2017, 2:51 PM

## 2017-11-16 ENCOUNTER — Encounter (HOSPITAL_COMMUNITY): Payer: Self-pay | Admitting: Orthopedic Surgery

## 2017-11-16 DIAGNOSIS — M25561 Pain in right knee: Secondary | ICD-10-CM | POA: Insufficient documentation

## 2017-11-16 NOTE — Discharge Summary (Signed)
Physician Discharge Summary   Patient ID: Henry Martin MRN: [id number] DOB/AGE: [birth year] 77 y.o.  Admit date: 11/14/2017 Discharge date: 11/15/2017  Primary Diagnosis: Medial compartment osteoarthritis, right knee  Admission Diagnoses:  Past Medical History:  Diagnosis Date  . Atrial fibrillation with rapid ventricular response (Henry) 07/04/11   Xarelto started 4/13;    . CAD (coronary artery disease)    mild nonobstructive by cath '05 (ASTEROID trial - mLAD 30%, CFX 20-30%, OM 20%, mRCA 30%, normal LVF), EF 65% by echo '12;    . Complication of anesthesia    "bowels fall asleep & he can't have BM" ;  "slow to wake"   . Diabetes mellitus without complication ([name])   . GERD (gastroesophageal reflux disease) 07/04/11   "once in awhile; haven't been treated for it"  . HLD (hyperlipidemia)   . HTN (hypertension)   . OSA (obstructive sleep apnea)    "suppose to be wearing my mask"  . Renal cell cancer, right ([place]) 2000   s/p partial neprhectomy   . Renal cell carcinoma   . Skin cancer 06/2011   left ear  . Stroke [facility]) 2007   denies residual   Discharge Diagnoses:   Principal Problem:   OA (osteoarthritis) of knee  Estimated body mass index is 32.01 kg/m as calculated from the following:   Height as of this encounter: 6' (1.829 m).   Weight as of this encounter: 107 kg.  Procedure:  Procedure(s) (LRB): Right knee medial unicompartmental arthroplasty (Right)   Consults: None  HPI: Henry Martin is a 77 y.o. male , who has significant isolated medial compartment arthritis of the Right knee. The patient has had nonoperative management including injections of cortisone and viscous supplements. Unfortunately, the pain persists. Radiograph showed isolated medial compartment bone-on-bone arthritis with normal-appearing patellofemoral and lateral compartments. The patient presents now for right knee unicompartmental arthroplasty.  Laboratory Data: Admission on 11/14/2017,  Discharged on 11/15/2017  Component Date Value Ref Range Status  . Glucose-Capillary 11/14/2017 96  70 - 99 mg/dL Final  . Prothrombin Time 11/14/2017 14.0  11.4 - 15.2 seconds Final  . INR 11/14/2017 1.09   Final   Performed at [facility], [place] [address] [place], [place] [address]  . Glucose-Capillary 11/14/2017 92  70 - 99 mg/dL Final  . WBC 11/15/2017 10.8* 4.0 - 10.5 K/uL Final  . RBC 11/15/2017 4.14* 4.22 - 5.81 MIL/uL Final  . Hemoglobin 11/15/2017 11.9* 13.0 - 17.0 g/dL Final  . HCT 11/15/2017 36.2* 39.0 - 52.0 % Final  . MCV 11/15/2017 87.4  78.0 - 100.0 fL Final  . MCH 11/15/2017 28.7  26.0 - 34.0 pg Final  . MCHC 11/15/2017 32.9  30.0 - 36.0 g/dL Final  . RDW 11/15/2017 12.8  11.5 - 15.5 % Final  . Platelets 11/15/2017 209  150 - 400 K/uL Final   Performed at [facility], [place] [address] [place], [place] [address]  . Sodium 11/15/2017 139  135 - 145 mmol/L Final  . Potassium 11/15/2017 4.1  3.5 - 5.1 mmol/L Final  . Chloride 11/15/2017 104  98 - 111 mmol/L Final  . CO2 11/15/2017 24  22 - 32 mmol/L Final  . Glucose, Bld 11/15/2017 205* 70 - 99 mg/dL Final  . BUN 11/15/2017 17  8 - 23 mg/dL Final  . Creatinine, Ser 11/15/2017 1.20  0.61 - 1.24 mg/dL Final  . Calcium 11/15/2017 8.9  8.9 - 10.3 mg/dL Final  . GFR  calc non Af Amer 11/15/2017 57* >60 mL/min Final  . GFR calc Af Amer 11/15/2017 >60  >60 mL/min Final   Comment: (NOTE) The eGFR has been calculated using the CKD EPI equation. This calculation has not been validated in all clinical situations. eGFR's persistently <60 mL/min signify possible Chronic Kidney Disease.   Georgiann Hahn gap 11/15/2017 11  5 - 15 Final   Performed at [facility], [place] [address] [place], [place] [address]  . Glucose-Capillary 11/14/2017 185* 70 - 99 mg/dL Final  . Glucose-Capillary 11/15/2017 160* 70 - 99 mg/dL Final  . Glucose-Capillary 11/15/2017 134* 70 - 99 mg/dL Final    Hospital Outpatient Visit on 11/08/2017  Component Date Value Ref Range Status  . Glucose-Capillary 11/08/2017 156* 70 - 99 mg/dL Final  . aPTT 11/08/2017 38* 24 - 36 seconds Final   Comment:        IF BASELINE aPTT IS ELEVATED, SUGGEST PATIENT RISK ASSESSMENT BE USED TO DETERMINE APPROPRIATE ANTICOAGULANT THERAPY. Performed at [facility], [place] [address], [place], [place] [address]   . Sodium 11/08/2017 141  135 - 145 mmol/L Final  . Potassium 11/08/2017 4.6  3.5 - 5.1 mmol/L Final  . Chloride 11/08/2017 106  98 - 111 mmol/L Final  . CO2 11/08/2017 28  22 - 32 mmol/L Final  . Glucose, Bld 11/08/2017 114* 70 - 99 mg/dL Final  . BUN 11/08/2017 19  8 - 23 mg/dL Final  . Creatinine, Ser 11/08/2017 1.21  0.61 - 1.24 mg/dL Final  . Calcium 11/08/2017 10.2  8.9 - 10.3 mg/dL Final  . Total Protein 11/08/2017 7.4  6.5 - 8.1 g/dL Final  . Albumin 11/08/2017 4.0  3.5 - 5.0 g/dL Final  . AST 11/08/2017 23  15 - 41 U/L Final  . ALT 11/08/2017 28  0 - 44 U/L Final  . Alkaline Phosphatase 11/08/2017 44  38 - 126 U/L Final  . Total Bilirubin 11/08/2017 0.9  0.3 - 1.2 mg/dL Final  . GFR calc non Af Amer 11/08/2017 56* >60 mL/min Final  . GFR calc Af Amer 11/08/2017 >60  >60 mL/min Final   Comment: (NOTE) The eGFR has been calculated using the CKD EPI equation. This calculation has not been validated in all clinical situations. eGFR's persistently <60 mL/min signify possible Chronic Kidney Disease.   Georgiann Hahn gap 11/08/2017 7  5 - 15 Final   Performed at [facility], [place] [address], [place], [place] [address]  . Prothrombin Time 11/08/2017 16.5* 11.4 - 15.2 seconds Final  . INR 11/08/2017 1.34   Final   Performed at [facility], [place] [address] [place], [place] [address]  . ABO/RH(D) 11/08/2017 A NEG   Final  . Antibody Screen 11/08/2017 NEG   Final  . Sample Expiration 11/08/2017 11/17/2017   Final  . Extend sample reason  11/08/2017    Final                   Value:NO TRANSFUSIONS OR PREGNANCY IN THE PAST 3 MONTHS Performed at [facility], [place] [address] [place], [place] [address]   . MRSA, PCR 11/08/2017 NEGATIVE  NEGATIVE Final  . Staphylococcus aureus 11/08/2017 NEGATIVE  NEGATIVE Final   Comment: (NOTE) The Xpert SA Assay (FDA approved for NASAL specimens in patients 45 years of age and older), is one component of a comprehensive surveillance program. It is not intended to diagnose infection nor to guide or monitor  treatment. Performed at [facility], [place] [address], [place], [place] [address]   . ABO/RH(D) 11/08/2017    Final                   Value:A NEG Performed at [facility], [place] [address], [place], [place] [address]   Office Visit on 10/11/2017  Component Date Value Ref Range Status  . WBC 10/15/2017 9.1  3.8 - 10.8 Thousand/uL Final  . RBC 10/15/2017 4.79  4.20 - 5.80 Million/uL Final  . Hemoglobin 10/15/2017 13.9  13.2 - 17.1 g/dL Final  . HCT 10/15/2017 42.0  38.5 - 50.0 % Final  . MCV 10/15/2017 87.7  80.0 - 100.0 fL Final  . MCH 10/15/2017 29.0  27.0 - 33.0 pg Final  . MCHC 10/15/2017 33.1  32.0 - 36.0 g/dL Final  . RDW 10/15/2017 12.0  11.0 - 15.0 % Final  . Platelets 10/15/2017 203  140 - 400 Thousand/uL Final  . MPV 10/15/2017 12.0  7.5 - 12.5 fL Final  . Neutro Abs 10/15/2017 4,750  1,500 - 7,800 cells/uL Final  . Lymphs Abs 10/15/2017 2,876  850 - 3,900 cells/uL Final  . WBC mixed population 10/15/2017 901  200 - 950 cells/uL Final  . Eosinophils Absolute 10/15/2017 482  15 - 500 cells/uL Final  . Basophils Absolute 10/15/2017 91  0 - 200 cells/uL Final  . Neutrophils Relative % 10/15/2017 52.2  % Final  . Total Lymphocyte 10/15/2017 31.6  % Final  . Monocytes Relative 10/15/2017 9.9  % Final  . Eosinophils Relative 10/15/2017 5.3  % Final  . Basophils Relative 10/15/2017 1.0  % Final  . Glucose, Bld 10/15/2017  218* 65 - 99 mg/dL Final   Comment: .            Fasting reference interval . For someone without known diabetes, a glucose value >125 mg/dL indicates that they may have diabetes and this should be confirmed with a follow-up test. .   . BUN 10/15/2017 15  7 - 25 mg/dL Final  . Creat 10/15/2017 1.04  0.70 - 1.18 mg/dL Final   Comment: For patients >33 years of age, the reference limit for Creatinine is approximately 13% higher for people identified as African-American. .   . GFR, Est Non African American 10/15/2017 69  > OR = 60 mL/min/1.50m Final  . GFR, Est African American 10/15/2017 80  > OR = 60 mL/min/1.773mFinal  . BUN/Creatinine Ratio 0878/58/8502OT APPLICABLE  6 - 22 (calc) Final  . Sodium 10/15/2017 138  135 - 146 mmol/L Final  . Potassium 10/15/2017 4.2  3.5 - 5.3 mmol/L Final  . Chloride 10/15/2017 103  98 - 110 mmol/L Final  . CO2 10/15/2017 27  20 - 32 mmol/L Final  . Calcium 10/15/2017 10.0  8.6 - 10.3 mg/dL Final  . Total Protein 10/15/2017 6.7  6.1 - 8.1 g/dL Final  . Albumin 10/15/2017 3.8  3.6 - 5.1 g/dL Final  . Globulin 10/15/2017 2.9  1.9 - 3.7 g/dL (calc) Final  . AG Ratio 10/15/2017 1.3  1.0 - 2.5 (calc) Final  . Total Bilirubin 10/15/2017 0.6  0.2 - 1.2 mg/dL Final  . Alkaline phosphatase (APISO) 10/15/2017 54  40 - 115 U/L Final  . AST 10/15/2017 13  10 - 35 U/L Final  . ALT 10/15/2017 19  9 - 46 U/L Final  . Cholesterol 10/15/2017 114  <200 mg/dL Final  . HDL 10/15/2017 37* >40 mg/dL  Final  . Triglycerides 10/15/2017 75  <150 mg/dL Final  . LDL Cholesterol (Calc) 10/15/2017 61  mg/dL (calc) Final   Comment: Reference range: <100 . Desirable range <100 mg/dL for primary prevention;   <70 mg/dL for patients with CHD or diabetic patients  with > or = 2 CHD risk factors. Marland Kitchen LDL-C is now calculated using the Martin-Hopkins  calculation, which is a validated novel method providing  better accuracy than the Friedewald equation in the  estimation of  LDL-C.  Cresenciano Genre et al. Annamaria Helling. 9937;169(67): 2061-2068  (http://education.[employer].com/faq/FAQ164)   . Total CHOL/HDL Ratio 10/15/2017 3.1  <5.0 (calc) Final  . Non-HDL Cholesterol (Calc) 10/15/2017 77  <130 mg/dL (calc) Final   Comment: For patients with diabetes plus 1 major ASCVD risk  factor, treating to a non-HDL-C goal of <100 mg/dL  (LDL-C of <70 mg/dL) is considered a therapeutic  option.   Marland Kitchen PSA 10/15/2017 3.5  < OR = 4.0 ng/mL Final   Comment: The total PSA value from this assay system is  standardized against the WHO standard. The test  result will be approximately 20% lower when compared  to the equimolar-standardized total PSA (Beckman  Coulter). Comparison of serial PSA results should be  interpreted with this fact in mind. . This test was performed using the Siemens  chemiluminescent method. Values obtained from  different assay methods cannot be used interchangeably. PSA levels, regardless of value, should not be interpreted as absolute evidence of the presence or absence of disease.   . Hgb A1c MFr Bld 10/15/2017 8.2* <5.7 % of total Hgb Final   Comment: For someone without known diabetes, a hemoglobin A1c value of 6.5% or greater indicates that they may have  diabetes and this should be confirmed with a follow-up  test. . For someone with known diabetes, a value <7% indicates  that their diabetes is well controlled and a value  greater than or equal to 7% indicates suboptimal  control. A1c targets should be individualized based on  duration of diabetes, age, comorbid conditions, and  other considerations. . Currently, no consensus exists regarding use of hemoglobin A1c for diagnosis of diabetes for children. .   . TEST NAME: 10/15/2017 HEMOGLOBIN A1c   Final  . TEST CODE: 10/15/2017 [id number]   Final  . CLIENT CONTACT: 10/15/2017 Danella Sensing   Final  . REPORT ALWAYS MESSAGE SIGNATURE 10/15/2017    Final   Comment: . The laboratory testing on this  patient was verbally requested or confirmed by the ordering physician or his or her authorized representative after contact with an employee of [employer]. Federal regulations require that we maintain on file written authorization for all laboratory testing.  Accordingly we are asking that the ordering physician or his or her authorized representative sign a copy of this report and promptly return it to the client service representative. . . Signature:____________________________________________________ . Please fax this signed page to [phone] or return it via your [employer] courier.      X-Rays:No results found.  EKG: Orders placed or performed in visit on 10/15/17  . EKG 12-Lead     Hospital Course: Henry Martin is a 77 y.o. who was admitted to [facility]. They were brought to the operating room on 11/14/2017 and underwent Procedure(s): Right knee medial unicompartmental arthroplasty.  Patient tolerated the procedure well and was later transferred to the recovery room and then to the orthopaedic floor for postoperative care. They were given PO and IV analgesics  for pain control following their surgery. They were given 24 hours of postoperative antibiotics of  Anti-infectives (From admission, onward)   Start     Dose/Rate Route Frequency Ordered Stop   11/14/17 2200  ceFAZolin (ANCEF) IVPB 2g/100 mL premix     2 g 200 mL/hr over 30 Minutes Intravenous Every 6 hours 11/14/17 1840 11/15/17 0430   11/14/17 1330  ceFAZolin (ANCEF) IVPB 2g/100 mL premix     2 g 200 mL/hr over 30 Minutes Intravenous On call to O.R. 11/14/17 1326 11/14/17 1627     and started on DVT prophylaxis in the form of Xarelto.   PT and OT were ordered for total joint protocol. Discharge planning consulted to help with postop disposition and equipment needs.  Patient had a good night on the evening of surgery. They started to get up OOB with therapy on POD #1. Pt was seen during  rounds and was ready to go home pending progress with therapy. He worked with therapy on POD #1 and was meeting his goals. Pt was discharged to home later that day in stable condition.  Diet: Cardiac diet Activity: WBAT Follow-up: in 2 weeks with Dr. Wynelle Link Disposition: Home with outpatient therapy at [facility] Discharged Condition: stable   Discharge Instructions    Call MD / Call 911   Complete by:  As directed    If you experience chest pain or shortness of breath, CALL 911 and be transported to the hospital emergency room.  If you develope a fever above 101 F, pus (white drainage) or increased drainage or redness at the wound, or calf pain, call your surgeon's office.   Change dressing   Complete by:  As directed    Change dressing on Friday (09/13), then change the dressing daily with sterile 4 x 4 inch gauze dressing and apply TED hose.   Constipation Prevention   Complete by:  As directed    Drink plenty of fluids.  Prune juice may be helpful.  You may use a stool softener, such as Colace (over the counter) 100 mg twice a day.  Use MiraLax (over the counter) for constipation as needed.   Diet - low sodium heart healthy   Complete by:  As directed    Discharge instructions   Complete by:  As directed    Dr. Gaynelle Arabian Total Joint Specialist [facility] [address] [place], [place] [address] [phone]  PARTIAL KNEE REPLACEMENT POSTOPERATIVE DIRECTIONS  Knee Rehabilitation, Guidelines Following Surgery  Results after knee surgery are often greatly improved when you follow the exercise, range of motion and muscle strengthening exercises prescribed by your doctor. Safety measures are also important to protect the knee from further injury. Any time any of these exercises cause you to have increased pain or swelling in your knee joint, decrease the amount until you are comfortable again and slowly increase them. If you have problems or questions, call your  caregiver or physical therapist for advice.   HOME CARE INSTRUCTIONS  Remove items at home which could result in a fall. This includes throw rugs or furniture in walking pathways.  ICE to the affected knee every three hours for 30 minutes at a time and then as needed for pain and swelling.  Continue to use ice on the knee for pain and swelling from surgery. You may notice swelling that will progress down to the foot and ankle.  This is normal after surgery.  Elevate the leg when you are not  up walking on it.   Continue to use the breathing machine which will help keep your temperature down.  It is common for your temperature to cycle up and down following surgery, especially at night when you are not up moving around and exerting yourself.  The breathing machine keeps your lungs expanded and your temperature down. Do not place pillow under knee, focus on keeping the knee straight while resting   DIET You may resume your previous home diet once your are discharged from the hospital.  DRESSING / WOUND CARE / SHOWERING You may shower 3 days after surgery, but keep the wounds dry during showering.  You may use an occlusive plastic wrap (Press'n Seal for example), NO SOAKING/SUBMERGING IN THE BATHTUB.  If the bandage gets wet, change with a clean dry gauze.  If the incision gets wet, pat the wound dry with a clean towel. You may start showering once you are discharged home but do not submerge the incision under water. Just pat the incision dry and apply a dry gauze dressing on daily. Change the surgical dressing daily and reapply a dry dressing each time.  ACTIVITY Walk with your walker as instructed. Use walker as long as suggested by your caregivers. Avoid periods of inactivity such as sitting longer than an hour when not asleep. This helps prevent blood clots.  You may resume a sexual relationship in one month or when given the OK by your doctor.  You may return to work once you are cleared by  your doctor.  Do not drive a car for 6 weeks or until released by you surgeon.  Do not drive while taking narcotics.  WEIGHT BEARING Weight bearing as tolerated with assist device (walker, cane, etc) as directed, use it as long as suggested by your surgeon or therapist, typically at least 4-6 weeks.  POSTOPERATIVE CONSTIPATION PROTOCOL Constipation - defined medically as fewer than three stools per week and severe constipation as less than one stool per week.  One of the most common issues patients have following surgery is constipation.  Even if you have a regular bowel pattern at home, your normal regimen is likely to be disrupted due to multiple reasons following surgery.  Combination of anesthesia, postoperative narcotics, change in appetite and fluid intake all can affect your bowels.  In order to avoid complications following surgery, here are some recommendations in order to help you during your recovery period.  Colace (docusate) - Pick up an over-the-counter form of Colace or another stool softener and take twice a day as long as you are requiring postoperative pain medications.  Take with a full glass of water daily.  If you experience loose stools or diarrhea, hold the colace until you stool forms back up.  If your symptoms do not get better within 1 week or if they get worse, check with your doctor.  Dulcolax (bisacodyl) - Pick up over-the-counter and take as directed by the product packaging as needed to assist with the movement of your bowels.  Take with a full glass of water.  Use this product as needed if not relieved by Colace only.   MiraLax (polyethylene glycol) - Pick up over-the-counter to have on hand.  MiraLax is a solution that will increase the amount of water in your bowels to assist with bowel movements.  Take as directed and can mix with a glass of water, juice, soda, coffee, or tea.  Take if you go more than two days without a movement. Do  not use MiraLax more than once  per day. Call your doctor if you are still constipated or irregular after using this medication for 7 days in a row.  If you continue to have problems with postoperative constipation, please contact the office for further assistance and recommendations.  If you experience "the worst abdominal pain ever" or develop nausea or vomiting, please contact the office immediatly for further recommendations for treatment.  ITCHING  If you experience itching with your medications, try taking only a single pain pill, or even half a pain pill at a time.  You can also use Benadryl over the counter for itching or also to help with sleep.   TED HOSE STOCKINGS Wear the elastic stockings on both legs for three weeks following surgery during the day but you may remove then at night for sleeping.  MEDICATIONS See your medication summary on the "After Visit Summary" that the nursing staff will review with you prior to discharge.  You may have some home medications which will be placed on hold until you complete the course of blood thinner medication.  It is important for you to complete the blood thinner medication as prescribed by your surgeon.  Continue your approved medications as instructed at time of discharge.  PRECAUTIONS If you experience chest pain or shortness of breath - call 911 immediately for transfer to the hospital emergency department.  If you develop a fever greater that 101 F, purulent drainage from wound, increased redness or drainage from wound, foul odor from the wound/dressing, or calf pain - CONTACT YOUR SURGEON.                                                   FOLLOW-UP APPOINTMENTS Make sure you keep all of your appointments after your operation with your surgeon and caregivers. You should call the office at the above phone number and make an appointment for approximately two weeks after the date of your surgery or on the date instructed by your surgeon outlined in the "After Visit  Summary".   RANGE OF MOTION AND STRENGTHENING EXERCISES  Rehabilitation of the knee is important following a knee injury or an operation. After just a few days of immobilization, the muscles of the thigh which control the knee become weakened and shrink (atrophy). Knee exercises are designed to build up the tone and strength of the thigh muscles and to improve knee motion. Often times heat used for twenty to thirty minutes before working out will loosen up your tissues and help with improving the range of motion but do not use heat for the first two weeks following surgery. These exercises can be done on a training (exercise) mat, on the floor, on a table or on a bed. Use what ever works the best and is most comfortable for you Knee exercises include:  Leg Lifts - While your knee is still immobilized in a splint or cast, you can do straight leg raises. Lift the leg to 60 degrees, hold for 3 sec, and slowly lower the leg. Repeat 10-20 times 2-3 times daily. Perform this exercise against resistance later as your knee gets better.  Quad and Hamstring Sets - Tighten up the muscle on the front of the thigh (Quad) and hold for 5-10 sec. Repeat this 10-20 times hourly. Hamstring sets are done by pushing the foot  backward against an object and holding for 5-10 sec. Repeat as with quad sets.  Leg Slides: Lying on your back, slowly slide your foot toward your buttocks, bending your knee up off the floor (only go as far as is comfortable). Then slowly slide your foot back down until your leg is flat on the floor again. Angel Wings: Lying on your back spread your legs to the side as far apart as you can without causing discomfort.  A rehabilitation program following serious knee injuries can speed recovery and prevent re-injury in the future due to weakened muscles. Contact your doctor or a physical therapist for more information on knee rehabilitation.   IF YOU ARE TRANSFERRED TO A SKILLED REHAB FACILITY If the  patient is transferred to a skilled rehab facility following release from the hospital, a list of the current medications will be sent to the facility for the patient to continue.  When discharged from the skilled rehab facility, please have the facility set up the patient's [place] prior to being released. Also, the skilled facility will be responsible for providing the patient with their medications at time of release from the facility to include their pain medication, the muscle relaxants, and their blood thinner medication. If the patient is still at the rehab facility at time of the two week follow up appointment, the skilled rehab facility will also need to assist the patient in arranging follow up appointment in our office and any transportation needs.  MAKE SURE YOU:  Understand these instructions.  Get help right away if you are not doing well or get worse.    Pick up stool softner and laxative for home use following surgery while on pain medications. Do not submerge incision under water. Please use good hand washing techniques while changing dressing each day. May shower starting three days after surgery. Please use a clean towel to pat the incision dry following showers. Continue to use ice for pain and swelling after surgery. Do not use any lotions or creams on the incision until instructed by your surgeon.   Do not put a pillow under the knee. Place it under the heel.   Complete by:  As directed    Driving restrictions   Complete by:  As directed    No driving for two weeks   TED hose   Complete by:  As directed    Use stockings (TED hose) for three weeks on both leg(s).  You may remove them at night for sleeping.   Weight bearing as tolerated   Complete by:  As directed      Allergies as of 11/15/2017      Reactions   Contrast Media [iodinated Diagnostic Agents] Other (See Comments)   "Isovue"; "blisters; all down my legs"   Metrizamide Other (See  Comments)   "Isovue"; "blisters; all down my legs"      Medication List    TAKE these medications   ACCU-CHEK AVIVA PLUS w/Device Kit Use to check BS QAM   ACCU-CHEK MULTICLIX LANCET DEV Kit Check BC QAM DX: e11.9   accu-chek multiclix lancets Check BC QAM DX: e11.9   acetaminophen 500 MG tablet Commonly known as:  TYLENOL Take 1,000 mg by mouth daily as needed for moderate pain or headache.   atorvastatin 10 MG tablet Commonly known as:  LIPITOR Take 1 tablet (10 mg total) daily by mouth.   Fish Oil Oil Take 800 mg by mouth 2 (two) times daily.  fluticasone 50 MCG/ACT nasal spray Commonly known as:  FLONASE PLACE 2 SPRAYS INTO THE NOSE EVERY EVENING.   glucose blood test strip Check BC QAM DX: e11.9   metFORMIN 500 MG tablet Commonly known as:  GLUCOPHAGE Take 2 tablets (1,000 mg total) by mouth 2 (two) times daily with a meal.   methocarbamol 500 MG tablet Commonly known as:  ROBAXIN Take 1 tablet (500 mg total) by mouth every 6 (six) hours as needed for muscle spasms.   metoprolol succinate 25 MG 24 hr tablet Commonly known as:  TOPROL-XL Take 0.5 tablets (12.5 mg total) by mouth daily. What changed:  when to take this   olmesartan-hydrochlorothiazide 40-25 MG tablet Commonly known as:  BENICAR HCT TAKE 1 TABLET BY MOUTH ONCE A DAY   oxyCODONE 5 MG immediate release tablet Commonly known as:  Oxy IR/ROXICODONE Take 1-2 tablets (5-10 mg total) by mouth every 6 (six) hours as needed for moderate pain (pain score 4-6).   tadalafil 20 MG tablet Commonly known as:  ADCIRCA/CIALIS Take 1 tablet (20 mg total) by mouth daily as needed for erectile dysfunction.   Vitamin D3 1000 units Caps Take 1,000 Units by mouth daily.   XARELTO 20 MG Tabs tablet Generic drug:  rivaroxaban TAKE 1 TABLET BY MOUTH ONCE A DAY What changed:    how much to take  when to take this            Discharge Care Instructions  (From admission, onward)         Start      Ordered   11/15/17 0000  Weight bearing as tolerated     11/15/17 0743   11/15/17 0000  Change dressing    Comments:  Change dressing on Friday (09/13), then change the dressing daily with sterile 4 x 4 inch gauze dressing and apply TED hose.   11/15/17 0743         Follow-up Information    Gaynelle Arabian, MD. Schedule an appointment as soon as possible for a visit on 11/29/2017.   Specialty:  Orthopedic Surgery Contact information: [address] [place] [address] [phone]           Signed: Theresa Duty, PA-C Orthopedic Surgery 11/16/2017, 1:03 PM

## 2017-11-19 DIAGNOSIS — M25561 Pain in right knee: Secondary | ICD-10-CM | POA: Diagnosis not present

## 2017-11-22 DIAGNOSIS — M25561 Pain in right knee: Secondary | ICD-10-CM | POA: Diagnosis not present

## 2017-11-26 DIAGNOSIS — M25561 Pain in right knee: Secondary | ICD-10-CM | POA: Diagnosis not present

## 2017-11-29 DIAGNOSIS — M25561 Pain in right knee: Secondary | ICD-10-CM | POA: Diagnosis not present

## 2017-12-03 DIAGNOSIS — M25561 Pain in right knee: Secondary | ICD-10-CM | POA: Diagnosis not present

## 2017-12-06 DIAGNOSIS — M25561 Pain in right knee: Secondary | ICD-10-CM | POA: Diagnosis not present

## 2017-12-10 DIAGNOSIS — M25561 Pain in right knee: Secondary | ICD-10-CM | POA: Diagnosis not present

## 2017-12-13 DIAGNOSIS — M25561 Pain in right knee: Secondary | ICD-10-CM | POA: Diagnosis not present

## 2017-12-18 DIAGNOSIS — M25561 Pain in right knee: Secondary | ICD-10-CM | POA: Diagnosis not present

## 2017-12-28 DIAGNOSIS — R69 Illness, unspecified: Secondary | ICD-10-CM | POA: Diagnosis not present

## 2018-01-05 IMAGING — CR DG WRIST 2V*R*
2 series · 2 of 2 positions shown · non-contrast
Comparison: None.

CLINICAL DATA: Pain in the right wrist for 3 days, clinical
suspicion of gout

EXAM:
RIGHT WRIST - 2 VIEW

[x wrist pa right]
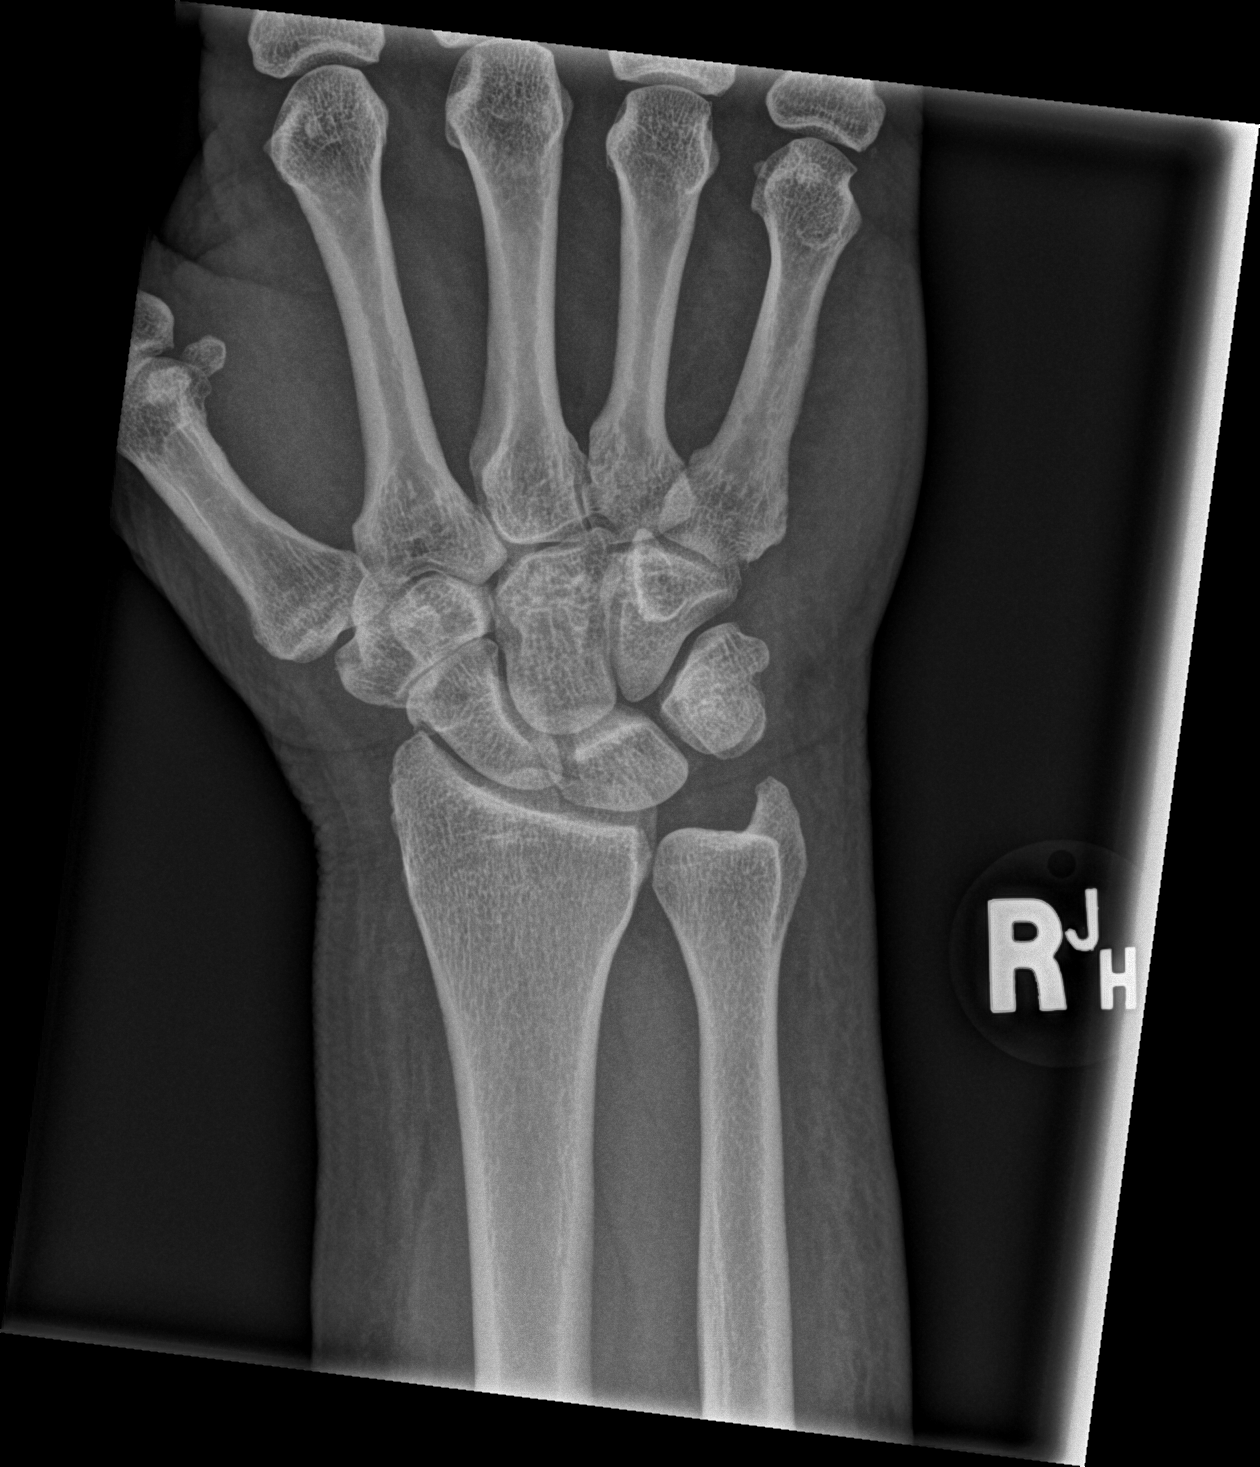

[x wrist lat right]
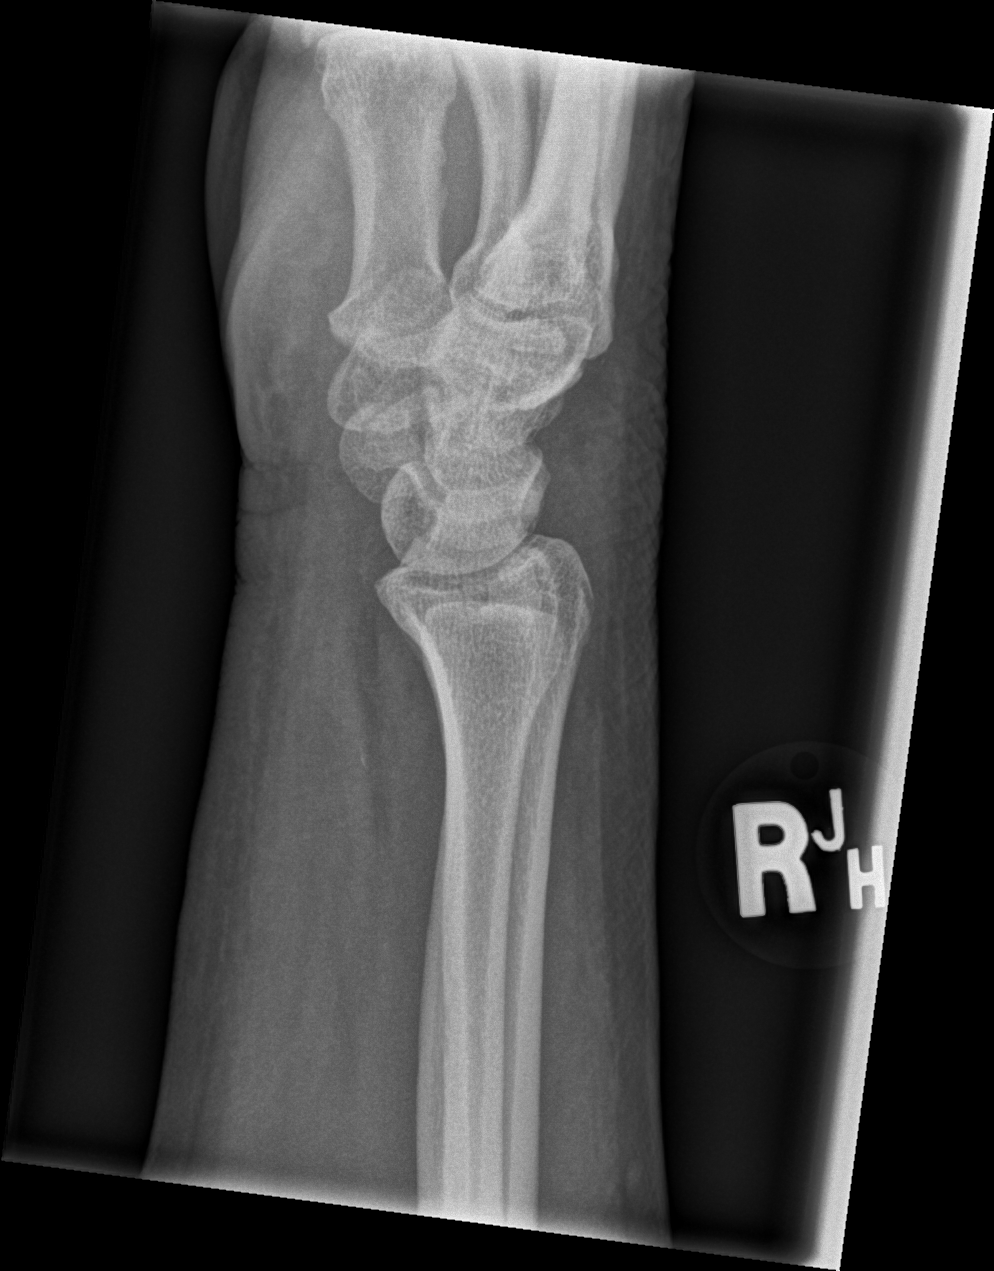

[2 of 2 positions shown; findings below may reference images not displayed]

FINDINGS: The radiocarpal joint space appears normal. The ulnar styloid is
intact. The carpal bones are in normal position with normal
alignment.
IMPRESSION: Negative.

## 2018-01-10 DIAGNOSIS — M5416 Radiculopathy, lumbar region: Secondary | ICD-10-CM | POA: Diagnosis not present

## 2018-03-04 DIAGNOSIS — R69 Illness, unspecified: Secondary | ICD-10-CM | POA: Diagnosis not present

## 2018-03-05 ENCOUNTER — Other Ambulatory Visit: Payer: Self-pay | Admitting: Family Medicine

## 2018-03-06 DIAGNOSIS — S0183XA Puncture wound without foreign body of other part of head, initial encounter: Secondary | ICD-10-CM

## 2018-03-06 HISTORY — DX: Puncture wound without foreign body of other part of head, initial encounter: S01.83XA

## 2018-03-06 HISTORY — PX: OTHER SURGICAL HISTORY: SHX169

## 2018-03-26 ENCOUNTER — Other Ambulatory Visit: Payer: Self-pay | Admitting: Family Medicine

## 2018-04-23 ENCOUNTER — Other Ambulatory Visit: Payer: Self-pay | Admitting: Family Medicine

## 2018-04-27 ENCOUNTER — Encounter: Payer: Self-pay | Admitting: Gastroenterology

## 2018-06-03 DIAGNOSIS — R69 Illness, unspecified: Secondary | ICD-10-CM | POA: Diagnosis not present

## 2018-06-05 ENCOUNTER — Other Ambulatory Visit: Payer: Self-pay | Admitting: Family Medicine

## 2018-07-11 ENCOUNTER — Other Ambulatory Visit: Payer: Self-pay | Admitting: Family Medicine

## 2018-07-11 MED ORDER — OLMESARTAN MEDOXOMIL 40 MG PO TABS: 40.0000 mg | ORAL_TABLET | Freq: Every day | ORAL | 2 refills | Status: DC

## 2018-07-11 MED ORDER — HYDROCHLOROTHIAZIDE 25 MG PO TABS: 25.0000 mg | ORAL_TABLET | Freq: Every day | ORAL | 2 refills | Status: DC

## 2018-08-01 DIAGNOSIS — R69 Illness, unspecified: Secondary | ICD-10-CM | POA: Diagnosis not present

## 2018-08-02 DIAGNOSIS — H353111 Nonexudative age-related macular degeneration, right eye, early dry stage: Secondary | ICD-10-CM | POA: Diagnosis not present

## 2018-08-02 DIAGNOSIS — H2513 Age-related nuclear cataract, bilateral: Secondary | ICD-10-CM | POA: Diagnosis not present

## 2018-08-02 DIAGNOSIS — H5203 Hypermetropia, bilateral: Secondary | ICD-10-CM | POA: Diagnosis not present

## 2018-08-30 ENCOUNTER — Other Ambulatory Visit: Payer: Self-pay

## 2018-08-30 ENCOUNTER — Other Ambulatory Visit: Payer: Medicare HMO

## 2018-08-30 DIAGNOSIS — Z125 Encounter for screening for malignant neoplasm of prostate: Secondary | ICD-10-CM

## 2018-08-30 DIAGNOSIS — E785 Hyperlipidemia, unspecified: Secondary | ICD-10-CM | POA: Diagnosis not present

## 2018-08-30 DIAGNOSIS — Z0000 Encounter for general adult medical examination without abnormal findings: Secondary | ICD-10-CM

## 2018-08-30 DIAGNOSIS — I10 Essential (primary) hypertension: Secondary | ICD-10-CM | POA: Diagnosis not present

## 2018-08-31 LAB — CBC WITH DIFFERENTIAL/PLATELET
Absolute Monocytes: 860 cells/uL (ref 200–950)
Basophils Absolute: 69 cells/uL (ref 0–200)
Basophils Relative: 0.8 %
Eosinophils Absolute: 404 cells/uL (ref 15–500)
Eosinophils Relative: 4.7 %
HCT: 40.3 % (ref 38.5–50.0)
Hemoglobin: 13 g/dL — ABNORMAL LOW (ref 13.2–17.1)
Lymphs Abs: 2666 cells/uL (ref 850–3900)
MCH: 28.5 pg (ref 27.0–33.0)
MCHC: 32.3 g/dL (ref 32.0–36.0)
MCV: 88.4 fL (ref 80.0–100.0)
MPV: 11.7 fL (ref 7.5–12.5)
Monocytes Relative: 10 %
Neutro Abs: 4601 cells/uL (ref 1500–7800)
Neutrophils Relative %: 53.5 %
Platelets: 210 10*3/uL (ref 140–400)
RBC: 4.56 10*6/uL (ref 4.20–5.80)
RDW: 12.4 % (ref 11.0–15.0)
Total Lymphocyte: 31 %
WBC: 8.6 10*3/uL (ref 3.8–10.8)

## 2018-08-31 LAB — COMPLETE METABOLIC PANEL WITH GFR
AG Ratio: 1.7 (calc) (ref 1.0–2.5)
ALT: 13 U/L (ref 9–46)
AST: 13 U/L (ref 10–35)
Albumin: 3.9 g/dL (ref 3.6–5.1)
Alkaline phosphatase (APISO): 37 U/L (ref 35–144)
BUN/Creatinine Ratio: 14 (calc) (ref 6–22)
BUN: 17 mg/dL (ref 7–25)
CO2: 27 mmol/L (ref 20–32)
Calcium: 9.6 mg/dL (ref 8.6–10.3)
Chloride: 107 mmol/L (ref 98–110)
Creat: 1.19 mg/dL — ABNORMAL HIGH (ref 0.70–1.18)
GFR, Est African American: 68 mL/min/{1.73_m2} (ref >=60)
GFR, Est Non African American: 59 mL/min/{1.73_m2} — ABNORMAL LOW (ref >=60)
Globulin: 2.3 g/dL (calc) (ref 1.9–3.7)
Glucose, Bld: 104 mg/dL — ABNORMAL HIGH (ref 65–99)
Potassium: 4.4 mmol/L (ref 3.5–5.3)
Sodium: 142 mmol/L (ref 135–146)
Total Bilirubin: 0.7 mg/dL (ref 0.2–1.2)
Total Protein: 6.2 g/dL (ref 6.1–8.1)

## 2018-08-31 LAB — LIPID PANEL
Cholesterol: 118 mg/dL (ref <200)
HDL: 39 mg/dL — ABNORMAL LOW (ref >=40)
LDL Cholesterol (Calc): 65 mg/dL (calc)
Non-HDL Cholesterol (Calc): 79 mg/dL (calc) (ref <130)
Total CHOL/HDL Ratio: 3 (calc) (ref <5.0)
Triglycerides: 67 mg/dL (ref <150)

## 2018-08-31 LAB — PSA: PSA: 3.7 ng/mL (ref <=4.0)

## 2018-09-10 ENCOUNTER — Ambulatory Visit (INDEPENDENT_AMBULATORY_CARE_PROVIDER_SITE_OTHER): Payer: Medicare HMO | Admitting: Family Medicine

## 2018-09-10 ENCOUNTER — Encounter: Payer: Self-pay | Admitting: Family Medicine

## 2018-09-10 ENCOUNTER — Other Ambulatory Visit: Payer: Self-pay

## 2018-09-10 VITALS — BP 144/70 | HR 70 | Temp 98.8°F | Resp 16 | Ht 72.0 in | Wt 229.0 lb

## 2018-09-10 DIAGNOSIS — I2510 Atherosclerotic heart disease of native coronary artery without angina pectoris: Secondary | ICD-10-CM | POA: Diagnosis not present

## 2018-09-10 DIAGNOSIS — IMO0002 Reserved for concepts with insufficient information to code with codable children: Secondary | ICD-10-CM

## 2018-09-10 DIAGNOSIS — Z8673 Personal history of transient ischemic attack (TIA), and cerebral infarction without residual deficits: Secondary | ICD-10-CM | POA: Diagnosis not present

## 2018-09-10 DIAGNOSIS — E7800 Pure hypercholesterolemia, unspecified: Secondary | ICD-10-CM

## 2018-09-10 DIAGNOSIS — E1165 Type 2 diabetes mellitus with hyperglycemia: Secondary | ICD-10-CM

## 2018-09-10 DIAGNOSIS — Z0000 Encounter for general adult medical examination without abnormal findings: Secondary | ICD-10-CM

## 2018-09-10 DIAGNOSIS — Z0001 Encounter for general adult medical examination with abnormal findings: Secondary | ICD-10-CM

## 2018-09-10 DIAGNOSIS — E118 Type 2 diabetes mellitus with unspecified complications: Secondary | ICD-10-CM

## 2018-09-10 DIAGNOSIS — I10 Essential (primary) hypertension: Secondary | ICD-10-CM

## 2018-09-10 DIAGNOSIS — Z7901 Long term (current) use of anticoagulants: Secondary | ICD-10-CM | POA: Diagnosis not present

## 2018-09-10 DIAGNOSIS — Z125 Encounter for screening for malignant neoplasm of prostate: Secondary | ICD-10-CM

## 2018-09-10 DIAGNOSIS — I480 Paroxysmal atrial fibrillation: Secondary | ICD-10-CM

## 2018-09-10 LAB — HEMOGLOBIN A1C, FINGERSTICK: Hgb A1C (fingerstick): 6 % OF TOTAL HGB — ABNORMAL HIGH (ref <6.0)

## 2018-09-10 MED ORDER — HYDROCHLOROTHIAZIDE 25 MG PO TABS: 25.0000 mg | ORAL_TABLET | Freq: Every day | ORAL | 2 refills | Status: DC

## 2018-09-10 MED ORDER — OLMESARTAN MEDOXOMIL 40 MG PO TABS: 40.0000 mg | ORAL_TABLET | Freq: Every day | ORAL | 3 refills | Status: DC

## 2018-09-10 MED ORDER — RIVAROXABAN 20 MG PO TABS: 20.0000 mg | ORAL_TABLET | Freq: Every day | ORAL | 3 refills | Status: DC

## 2018-09-10 MED ORDER — AMOXICILLIN 500 MG PO CAPS: 2000.0000 mg | ORAL_CAPSULE | Freq: Once | ORAL | 1 refills | Status: AC

## 2018-09-10 NOTE — Addendum Note (Signed)
Addended by: Jenna Luo T on: 09/10/2018 12:38 PM   Modules accepted: Orders

## 2018-09-10 NOTE — Progress Notes (Addendum)
Subjective:    Patient ID: Henry Martin, male    DOB: March 07, 1940, 78 y.o.   MRN: [id number]  HPI 10/2017 Patient was recently seen for preoperative clearance for right knee replacement scheduled to be performed September 11.  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  As a result, a follow-up hemoglobin A1c was obtained which confirmed type 2 diabetes.   He is here today to discuss treatment options for his diabetes prior to his upcoming surgery.  At that time, my plan was: Spent 20 minutes with the patient discussing a low carbohydrate diet, less than 45 g of carbohydrates per meal, less than 15 g of carbohydrates per snack.  We discussed with carbohydrates were in the foods to avoid in his diet.  I recommended 30 minutes a day 5 days a week of aerobic exercise and ultimately I like to see the patient try to lose 20 to 30 pounds over the next 6 to 10 months.  Meanwhile we will begin the patient on metformin 500 mg p.o. twice daily and increase to 1000 mg p.o. twice daily gradually over the next month.  Reassess hemoglobin A1c in 3 months.  Blood pressure today is acceptable.  09/10/18 Patient has not been seen since.  He is here today for a complete physical exam.  Most recent lab work is listed below: Appointment on 08/30/2018  Component Date Value Ref Range Status  . WBC 08/30/2018 8.6  3.8 - 10.8 Thousand/uL Final  . RBC 08/30/2018 4.56  4.20 - 5.80 Million/uL Final  . Hemoglobin 08/30/2018 13.0* 13.2 - 17.1 g/dL Final  . HCT 08/30/2018 40.3  38.5 - 50.0 % Final  . MCV 08/30/2018 88.4  80.0 - 100.0 fL Final  . MCH 08/30/2018 28.5  27.0 - 33.0 pg Final  . MCHC 08/30/2018 32.3  32.0 - 36.0 g/dL Final  . RDW 08/30/2018 12.4  11.0 - 15.0 % Final  . Platelets 08/30/2018 210  140 - 400 Thousand/uL Final  . MPV 08/30/2018 11.7  7.5 - 12.5 fL Final  . Neutro Abs 08/30/2018 4,601  1,500 - 7,800  cells/uL Final  . Lymphs Abs 08/30/2018 2,666  850 - 3,900 cells/uL Final  . Absolute Monocytes 08/30/2018 860  200 - 950 cells/uL Final  . Eosinophils Absolute 08/30/2018 404  15 - 500 cells/uL Final  . Basophils Absolute 08/30/2018 69  0 - 200 cells/uL Final  . Neutrophils Relative % 08/30/2018 53.5  % Final  . Total Lymphocyte 08/30/2018 31.0  % Final  . Monocytes Relative 08/30/2018 10.0  % Final  . Eosinophils Relative 08/30/2018 4.7  % Final  . Basophils Relative 08/30/2018 0.8  % Final  . Glucose, Bld 08/30/2018 104* 65 - 99 mg/dL Final   Comment: .            Fasting reference interval . For someone without known diabetes, a glucose value between 100 and 125 mg/dL is consistent with prediabetes and should be confirmed with a follow-up test. .   . BUN 08/30/2018 17  7 - 25 mg/dL Final  . Creat 08/30/2018 1.19* 0.70 - 1.18 mg/dL Final   Comment: For patients >3 years of age, the reference limit for Creatinine is approximately 13% higher for people identified as African-American. .   . GFR, Est Non African American 08/30/2018 59* > OR = 60 mL/min/1.38m Final  . GFR,  Est African American 08/30/2018 68  > OR = 60 mL/min/1.19m Final  . BUN/Creatinine Ratio 08/30/2018 14  6 - 22 (calc) Final  . Sodium 08/30/2018 142  135 - 146 mmol/L Final  . Potassium 08/30/2018 4.4  3.5 - 5.3 mmol/L Final  . Chloride 08/30/2018 107  98 - 110 mmol/L Final  . CO2 08/30/2018 27  20 - 32 mmol/L Final  . Calcium 08/30/2018 9.6  8.6 - 10.3 mg/dL Final  . Total Protein 08/30/2018 6.2  6.1 - 8.1 g/dL Final  . Albumin 08/30/2018 3.9  3.6 - 5.1 g/dL Final  . Globulin 08/30/2018 2.3  1.9 - 3.7 g/dL (calc) Final  . AG Ratio 08/30/2018 1.7  1.0 - 2.5 (calc) Final  . Total Bilirubin 08/30/2018 0.7  0.2 - 1.2 mg/dL Final  . Alkaline phosphatase (APISO) 08/30/2018 37  35 - 144 U/L Final  . AST 08/30/2018 13  10 - 35 U/L Final  . ALT 08/30/2018 13  9 - 46 U/L Final  . Cholesterol 08/30/2018 118  <200  mg/dL Final  . HDL 08/30/2018 39* > OR = 40 mg/dL Final  . Triglycerides 08/30/2018 67  <150 mg/dL Final  . LDL Cholesterol (Calc) 08/30/2018 65  mg/dL (calc) Final   Comment: Reference range: <100 . Desirable range <100 mg/dL for primary prevention;   <70 mg/dL for patients with CHD or diabetic patients  with > or = 2 CHD risk factors. .Marland KitchenLDL-C is now calculated using the Martin-Hopkins  calculation, which is a validated novel method providing  better accuracy than the Friedewald equation in the  estimation of LDL-C.  MCresenciano Genreet al. JAnnamaria Helling 22694;854(62: 2061-2068  (http://education.[employer].com/faq/FAQ164)   . Total CHOL/HDL Ratio 08/30/2018 3.0  <5.0 (calc) Final  . Non-HDL Cholesterol (Calc) 08/30/2018 79  <130 mg/dL (calc) Final   Comment: For patients with diabetes plus 1 major ASCVD risk  factor, treating to a non-HDL-C goal of <100 mg/dL  (LDL-C of <70 mg/dL) is considered a therapeutic  option.   .Marland KitchenPSA 08/30/2018 3.7  < OR = 4.0 ng/mL Final   Comment: The total PSA value from this assay system is  standardized against the WHO standard. The test  result will be approximately 20% lower when compared  to the equimolar-standardized total PSA (Beckman  Coulter). Comparison of serial PSA results should be  interpreted with this fact in mind. . This test was performed using the Siemens  chemiluminescent method. Values obtained from  different assay methods cannot be used interchangeably. PSA levels, regardless of value, should not be interpreted as absolute evidence of the presence or absence of disease.    Immunization History  Administered Date(s) Administered  . Influenza, High Dose Seasonal PF 01/19/2017  . Influenza,inj,Quad PF,6+ Mos 01/17/2013, 12/18/2013, 01/12/2015, 12/28/2015  . Influenza-Unspecified 01/04/2006, 12/05/2006, 12/17/2007, 12/19/2008, 12/31/2009, 02/03/2011, 01/11/2012  . Pneumococcal Conjugate-13 09/02/2013  . Pneumococcal  Polysaccharide-23 12/14/2003, 10/05/2015  . Td 05/05/1998, 06/24/2008  . Tdap 06/24/2008  . Zoster 03/15/2007  . Zoster Recombinat (Shingrix) 01/19/2017, 04/30/2017  Patient appears to be due for a tetanus shot.  Patient declines this today  Last colonoscopy was in 2014.  Patient was found to have a tubular adenoma at that time.  Repeat colonoscopy was recommended in 2019.  Therefore patient is overdue for a colonoscopy.  He has received communication from his gastroenterologist office however he has not yet made an appointment.  Since his last visit here he is lost more than 20 pounds through a combination  of diet and exercise.  His fasting blood sugars are ranging between 104 and 134 with average less than 120.  His PSA has risen slightly from 3.5-3.7 since last year however he denies any symptoms of lower urinary tract obstruction  Past Medical History:  Diagnosis Date  . Atrial fibrillation with rapid ventricular response ([place]) 07/04/11   Xarelto started 4/13;    . CAD (coronary artery disease)    mild nonobstructive by cath '05 (ASTEROID trial - mLAD 30%, CFX 20-30%, OM 20%, mRCA 30%, normal LVF), EF 65% by echo '12;    . Complication of anesthesia    "bowels fall asleep & he can't have BM" ;  "slow to wake"   . Diabetes mellitus without complication ([place])   . GERD (gastroesophageal reflux disease) 07/04/11   "once in awhile; haven't been treated for it"  . HLD (hyperlipidemia)   . HTN (hypertension)   . OSA (obstructive sleep apnea)    "suppose to be wearing my mask"  . Renal cell cancer, right ([place]) 2000   s/p partial neprhectomy   . Renal cell carcinoma   . Skin cancer 06/2011   left ear  . Stroke [facility]) 2007   denies residual   Past Surgical History:  Procedure Laterality Date  . CARDIAC CATHETERIZATION  ~ 2007  . KNEE ARTHROSCOPY Left 1990's  . nasal roto rooter surgery    . PARTIAL KNEE ARTHROPLASTY Right 11/14/2017   Procedure: Right knee medial unicompartmental  arthroplasty;  Surgeon: Gaynelle Arabian, MD;  Location: [facility];  Service: Orthopedics;  Laterality: Right;  . PARTIAL NEPHRECTOMY Right 2000   "took off  25%"  . SHOULDER ARTHROSCOPY  "way back when "  . THYROIDECTOMY, PARTIAL  ~ 2007  . TONSILLECTOMY AND ADENOIDECTOMY     "as a child"  . TOTAL KNEE ARTHROPLASTY Left 2010   Current Outpatient Medications on File Prior to Visit  Medication Sig Dispense Refill  . acetaminophen (TYLENOL) 500 MG tablet Take 1,000 mg by mouth daily as needed for moderate pain or headache.    Marland Kitchen atorvastatin (LIPITOR) 10 MG tablet TAKE 1 TABLET DAILY 90 tablet 3  . Blood Glucose Monitoring Suppl (ACCU-CHEK AVIVA PLUS) w/Device KIT Use to check BS QAM 1 kit 0  . Cholecalciferol (VITAMIN D3) 1000 UNITS CAPS Take 1,000 Units by mouth daily.     . Fish Oil OIL Take 800 mg by mouth 2 (two) times daily.     . fluticasone (FLONASE) 50 MCG/ACT nasal spray PLACE 2 SPRAYS INTO THE NOSE EVERY EVENING. (Patient not taking: Reported on 11/06/2017) 16 g 3  . glucose blood (ACCU-CHEK AVIVA PLUS) test strip Check BC QAM DX: e11.9 100 each 3  . hydrochlorothiazide (HYDRODIURIL) 25 MG tablet Take 1 tablet (25 mg total) by mouth daily. 90 tablet 2  . Lancets (ACCU-CHEK MULTICLIX) lancets Check BC QAM DX: e11.9 100 each 3  . Lancets Misc. (ACCU-CHEK MULTICLIX LANCET DEV) KIT Check BC QAM DX: e11.9 100 each 3  . metFORMIN (GLUCOPHAGE) 500 MG tablet Take 2 tablets (1,000 mg total) by mouth 2 (two) times daily with a meal. 360 tablet 3  . methocarbamol (ROBAXIN) 500 MG tablet Take 1 tablet (500 mg total) by mouth every 6 (six) hours as needed for muscle spasms. 40 tablet 0  . metoprolol succinate (TOPROL-XL) 25 MG 24 hr tablet Take 0.5 tablets (12.5 mg total) by mouth at bedtime. 45 tablet 3  . olmesartan (BENICAR) 40 MG tablet Take 1 tablet (40 mg  total) by mouth daily. 90 tablet 2  . oxyCODONE (OXY IR/ROXICODONE) 5 MG immediate release tablet Take 1-2 tablets (5-10 mg total) by mouth  every 6 (six) hours as needed for moderate pain (pain score 4-6). 56 tablet 0  . tadalafil (CIALIS) 20 MG tablet Take 1 tablet (20 mg total) by mouth daily as needed for erectile dysfunction. 10 tablet 11  . XARELTO 20 MG TABS tablet TAKE 1 TABLET BY MOUTH ONCE DAILY 90 tablet 3   No current facility-administered medications on file prior to visit.    Allergies  Allergen Reactions  . Contrast Media [Iodinated Diagnostic Agents] Other (See Comments)    "Isovue"; "blisters; all down my legs"  . Metrizamide Other (See Comments)    "Isovue"; "blisters; all down my legs"   Social History   Socioeconomic History  . Marital status: Married    Spouse name: Not on file  . Number of children: Not on file  . Years of education: Not on file  . Highest education level: Not on file  Occupational History  . Not on file  Social Needs  . Financial resource strain: Not on file  . Food insecurity    Worry: Not on file    Inability: Not on file  . Transportation needs    Medical: Not on file    Non-medical: Not on file  Tobacco Use  . Smoking status: Former Smoker    Packs/day: 1.00    Years: 54.00    Pack years: 54.00    Types: Cigarettes    Quit date: 04/04/1977    Years since quitting: 41.4  . Smokeless tobacco: Former Systems developer    Types: [place] date: 10/04/2005  Substance and Sexual Activity  . Alcohol use: Yes    Comment: 3-5 beers ech month    . Drug use: No  . Sexual activity: Yes    Comment: married happily [place]  . Physical activity    Days per week: Not on file    Minutes per session: Not on file  . Stress: Not on file  Relationships  . Social Herbalist on phone: Not on file    Gets together: Not on file    Attends religious service: Not on file    Active member of club or organization: Not on file    Attends meetings of clubs or organizations: Not on file    Relationship status: Not on file  . Intimate partner violence    Fear of current or  ex partner: Not on file    Emotionally abused: Not on file    Physically abused: Not on file    Forced sexual activity: Not on file  Other Topics Concern  . Not on file  Social History Narrative   Retired. Lives in [place]   Family History  Problem Relation Age of Onset  . Heart failure Mother        died at 59  . Diabetes Mother   . Kidney cancer Father   . Colon cancer Neg Hx   . Prostate cancer Neg Hx       Review of Systems  All other systems reviewed and are negative.      Objective:   Physical Exam  Constitutional: He is oriented to person, place, and time. He appears well-developed and well-nourished. No distress.  HENT:  Head: Normocephalic and atraumatic.  Right Ear: External ear normal.  Left Ear: External ear  normal.  Nose: Nose normal.  Mouth/Throat: Oropharynx is clear and moist. No oropharyngeal exudate.  Eyes: Pupils are equal, round, and reactive to light. Conjunctivae and EOM are normal. Right eye exhibits no discharge. Left eye exhibits no discharge. No scleral icterus.  Neck: Normal range of motion. Neck supple. No JVD present. No tracheal deviation present. No thyromegaly present.  Cardiovascular: Normal rate, regular rhythm, normal heart sounds and intact distal pulses. Exam reveals no gallop and no friction rub.  No murmur heard. Pulmonary/Chest: Effort normal and breath sounds normal. No stridor. No respiratory distress. He has no wheezes. He has no rales. He exhibits no tenderness.  Abdominal: Soft. Bowel sounds are normal. He exhibits no distension and no mass. There is no abdominal tenderness. There is no rebound and no guarding.  Musculoskeletal: Normal range of motion.        General: No tenderness or edema.  Lymphadenopathy:    He has no cervical adenopathy.  Neurological: He is alert and oriented to person, place, and time. He has normal reflexes. No cranial nerve deficit. He exhibits normal muscle tone. Coordination normal.  Skin: Skin  is warm. No rash noted. He is not diaphoretic. No erythema. No pallor.  Psychiatric: He has a normal mood and affect. His behavior is normal. Judgment and thought content normal.  Vitals reviewed.         Assessment & Plan:  1. Diabetes mellitus type 2, uncontrolled, with complications (HCC) - Hemoglobin A1C, fingerstick 2. Benign essential HTN 3. PAF (paroxysmal atrial fibrillation) ([place]) 4. Pure hypercholesterolemia 5. Coronary artery disease involving native coronary artery of native heart without angina pectoris 6. Prostate cancer screening 7. Routine general medical examination at a health care facility 8. History of stroke 9. Chronic anticoagulation   I would repeat PSA in 6 months to monitor more closely.  I would also repeat a hemoglobin A1c in 6 months.  Blood pressure is acceptable.  Cholesterol is outstanding.  Patient declined the tetanus shot today.  The remainder of his immunizations are up-to-date.  I recommended that he schedule his colonoscopy at his earliest convenience.  Regular anticipatory guidance is provided.  Hemoglobin A1c today was found to be 6.0 in the office.  After a long discussion, the patient would like to discontinue his metformin and try to lose additional weight and see if he can manage his sugars through lifestyle changes.  I agreed to this plan.  We will recheck his lab work in December.  Patient will continue to monitor his fasting blood sugars every morning and if his fasting blood sugars rise greater than 130, he will resume metformin and notify me of the change.

## 2018-09-12 ENCOUNTER — Other Ambulatory Visit: Payer: Self-pay

## 2018-09-12 ENCOUNTER — Emergency Department (HOSPITAL_COMMUNITY)
Admission: EM | Admit: 2018-09-12 | Discharge: 2018-09-13 | Disposition: A | Discharge status: Home / Self Care | Payer: Medicare HMO | Attending: Emergency Medicine | Admitting: Emergency Medicine

## 2018-09-12 ENCOUNTER — Encounter (HOSPITAL_COMMUNITY): Payer: Self-pay | Admitting: Emergency Medicine

## 2018-09-12 ENCOUNTER — Emergency Department (HOSPITAL_COMMUNITY): Payer: Medicare HMO

## 2018-09-12 DIAGNOSIS — S01111A Laceration without foreign body of right eyelid and periocular area, initial encounter: Secondary | ICD-10-CM

## 2018-09-12 DIAGNOSIS — W3301XA Accidental discharge of shotgun, initial encounter: Secondary | ICD-10-CM | POA: Insufficient documentation

## 2018-09-12 DIAGNOSIS — E119 Type 2 diabetes mellitus without complications: Secondary | ICD-10-CM | POA: Diagnosis not present

## 2018-09-12 DIAGNOSIS — Z85820 Personal history of malignant melanoma of skin: Secondary | ICD-10-CM | POA: Diagnosis not present

## 2018-09-12 DIAGNOSIS — W3400XA Accidental discharge from unspecified firearms or gun, initial encounter: Secondary | ICD-10-CM

## 2018-09-12 DIAGNOSIS — I2510 Atherosclerotic heart disease of native coronary artery without angina pectoris: Secondary | ICD-10-CM | POA: Diagnosis not present

## 2018-09-12 DIAGNOSIS — Z87891 Personal history of nicotine dependence: Secondary | ICD-10-CM | POA: Diagnosis not present

## 2018-09-12 DIAGNOSIS — Y939 Activity, unspecified: Secondary | ICD-10-CM | POA: Insufficient documentation

## 2018-09-12 DIAGNOSIS — Y999 Unspecified external cause status: Secondary | ICD-10-CM | POA: Insufficient documentation

## 2018-09-12 DIAGNOSIS — S0181XA Laceration without foreign body of other part of head, initial encounter: Secondary | ICD-10-CM | POA: Diagnosis not present

## 2018-09-12 DIAGNOSIS — S0182XA Laceration with foreign body of other part of head, initial encounter: Secondary | ICD-10-CM | POA: Insufficient documentation

## 2018-09-12 DIAGNOSIS — Y92007 Garden or yard of unspecified non-institutional (private) residence as the place of occurrence of the external cause: Secondary | ICD-10-CM | POA: Diagnosis not present

## 2018-09-12 DIAGNOSIS — I10 Essential (primary) hypertension: Secondary | ICD-10-CM | POA: Insufficient documentation

## 2018-09-12 DIAGNOSIS — S0990XA Unspecified injury of head, initial encounter: Secondary | ICD-10-CM | POA: Diagnosis present

## 2018-09-12 DIAGNOSIS — Z8673 Personal history of transient ischemic attack (TIA), and cerebral infarction without residual deficits: Secondary | ICD-10-CM | POA: Insufficient documentation

## 2018-09-12 DIAGNOSIS — Z23 Encounter for immunization: Secondary | ICD-10-CM | POA: Insufficient documentation

## 2018-09-12 DIAGNOSIS — S0101XA Laceration without foreign body of scalp, initial encounter: Secondary | ICD-10-CM | POA: Diagnosis not present

## 2018-09-12 DIAGNOSIS — Z96652 Presence of left artificial knee joint: Secondary | ICD-10-CM | POA: Insufficient documentation

## 2018-09-12 DIAGNOSIS — Z7901 Long term (current) use of anticoagulants: Secondary | ICD-10-CM | POA: Insufficient documentation

## 2018-09-12 DIAGNOSIS — S0180XA Unspecified open wound of other part of head, initial encounter: Secondary | ICD-10-CM | POA: Diagnosis not present

## 2018-09-12 DIAGNOSIS — Z79899 Other long term (current) drug therapy: Secondary | ICD-10-CM | POA: Insufficient documentation

## 2018-09-12 DIAGNOSIS — S0085XA Superficial foreign body of other part of head, initial encounter: Secondary | ICD-10-CM | POA: Diagnosis not present

## 2018-09-12 DIAGNOSIS — R220 Localized swelling, mass and lump, head: Secondary | ICD-10-CM | POA: Diagnosis not present

## 2018-09-12 MED ORDER — LIDOCAINE-EPINEPHRINE (PF) 2 %-1:200000 IJ SOLN
10.0000 mL | Freq: Once | INTRAMUSCULAR | Status: AC
  Administered 2018-09-12: 10 mL via INTRADERMAL
  Filled 2018-09-12: qty 20

## 2018-09-12 MED ORDER — TETANUS-DIPHTH-ACELL PERTUSSIS 5-2.5-18.5 LF-MCG/0.5 IM SUSP
0.5000 mL | Freq: Once | INTRAMUSCULAR | Status: AC
  Administered 2018-09-12: 0.5 mL via INTRAMUSCULAR
  Filled 2018-09-12: qty 0.5
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
## 2018-09-12 NOTE — ED Notes (Signed)
NO trauma activation per Dr Sedonia Small.
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
## 2018-09-12 NOTE — Discharge Instructions (Signed)
As discussed today before your discharge, the suture material that was used is absorbable, you do not need to go get the suture material removed.  I do recommend that you follow-up with your primary care physician for reassessment of the wound site if there are any issues.

## 2018-09-13 NOTE — ED Notes (Signed)
Pt verbalizes understanding of discharge instructions and care plan, pt reports having an opportunity to have questions answered

## 2018-09-17 ENCOUNTER — Encounter: Payer: Self-pay | Admitting: Gastroenterology

## 2018-09-30 ENCOUNTER — Telehealth: Payer: Self-pay | Admitting: *Deleted

## 2018-09-30 NOTE — Telephone Encounter (Signed)
Henry Martin, stated he's felling fine and will call us if he needs to be seen.

## 2018-10-03 DIAGNOSIS — Z85828 Personal history of other malignant neoplasm of skin: Secondary | ICD-10-CM | POA: Diagnosis not present

## 2018-10-03 DIAGNOSIS — L821 Other seborrheic keratosis: Secondary | ICD-10-CM | POA: Diagnosis not present

## 2018-10-03 DIAGNOSIS — L570 Actinic keratosis: Secondary | ICD-10-CM | POA: Diagnosis not present

## 2018-10-03 DIAGNOSIS — D2261 Melanocytic nevi of right upper limb, including shoulder: Secondary | ICD-10-CM | POA: Diagnosis not present

## 2018-10-03 DIAGNOSIS — D485 Neoplasm of uncertain behavior of skin: Secondary | ICD-10-CM | POA: Diagnosis not present

## 2018-10-03 DIAGNOSIS — D225 Melanocytic nevi of trunk: Secondary | ICD-10-CM | POA: Diagnosis not present

## 2018-10-03 DIAGNOSIS — D1801 Hemangioma of skin and subcutaneous tissue: Secondary | ICD-10-CM | POA: Diagnosis not present

## 2018-10-07 DIAGNOSIS — R69 Illness, unspecified: Secondary | ICD-10-CM | POA: Diagnosis not present

## 2018-10-15 ENCOUNTER — Encounter: Payer: Self-pay | Admitting: Gastroenterology

## 2018-10-15 ENCOUNTER — Ambulatory Visit: Payer: Medicare HMO | Admitting: Gastroenterology

## 2018-10-15 VITALS — BP 128/78 | HR 76 | Temp 98.2°F | Ht 72.0 in | Wt 230.8 lb

## 2018-10-15 DIAGNOSIS — Z7901 Long term (current) use of anticoagulants: Secondary | ICD-10-CM

## 2018-10-15 DIAGNOSIS — Z86010 Personal history of colonic polyps: Secondary | ICD-10-CM

## 2018-10-15 DIAGNOSIS — I480 Paroxysmal atrial fibrillation: Secondary | ICD-10-CM
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
## 2018-10-15 NOTE — Patient Instructions (Signed)
If you are age 77 or older, your body mass index should be between 23-30. Your Body mass index is 31.3 kg/m. If this is out of the aforementioned range listed, please consider follow up with your Primary Care Provider.  If you are age 55 or younger, your body mass index should be between 19-25. Your Body mass index is 31.3 kg/m. If this is out of the aformentioned range listed, please consider follow up with your Primary Care Provider.   It was a pleasure to see you today!  Dr. Loletha Carrow

## 2018-11-19 DIAGNOSIS — R69 Illness, unspecified: Secondary | ICD-10-CM | POA: Diagnosis not present

## 2018-11-20 DIAGNOSIS — R69 Illness, unspecified: Secondary | ICD-10-CM | POA: Diagnosis not present

## 2018-11-21 DIAGNOSIS — Z96652 Presence of left artificial knee joint: Secondary | ICD-10-CM | POA: Diagnosis not present

## 2018-11-21 DIAGNOSIS — Z471 Aftercare following joint replacement surgery: Secondary | ICD-10-CM | POA: Diagnosis not present

## 2018-11-21 DIAGNOSIS — Z96653 Presence of artificial knee joint, bilateral: Secondary | ICD-10-CM | POA: Diagnosis not present

## 2018-11-26 ENCOUNTER — Other Ambulatory Visit: Payer: Self-pay | Admitting: Family Medicine

## 2018-12-13 DIAGNOSIS — R69 Illness, unspecified: Secondary | ICD-10-CM | POA: Diagnosis not present

## 2019-02-08 DIAGNOSIS — Z7984 Long term (current) use of oral hypoglycemic drugs: Secondary | ICD-10-CM | POA: Diagnosis not present

## 2019-02-08 DIAGNOSIS — Z008 Encounter for other general examination: Secondary | ICD-10-CM | POA: Diagnosis not present

## 2019-02-08 DIAGNOSIS — D6869 Other thrombophilia: Secondary | ICD-10-CM | POA: Diagnosis not present

## 2019-02-08 DIAGNOSIS — E669 Obesity, unspecified: Secondary | ICD-10-CM | POA: Diagnosis not present

## 2019-02-08 DIAGNOSIS — I10 Essential (primary) hypertension: Secondary | ICD-10-CM | POA: Diagnosis not present

## 2019-02-08 DIAGNOSIS — N529 Male erectile dysfunction, unspecified: Secondary | ICD-10-CM | POA: Diagnosis not present

## 2019-02-08 DIAGNOSIS — E785 Hyperlipidemia, unspecified: Secondary | ICD-10-CM | POA: Diagnosis not present

## 2019-02-08 DIAGNOSIS — Z7901 Long term (current) use of anticoagulants: Secondary | ICD-10-CM | POA: Diagnosis not present

## 2019-02-08 DIAGNOSIS — Z85528 Personal history of other malignant neoplasm of kidney: Secondary | ICD-10-CM | POA: Diagnosis not present

## 2019-02-08 DIAGNOSIS — I4891 Unspecified atrial fibrillation: Secondary | ICD-10-CM | POA: Diagnosis not present

## 2019-02-08 DIAGNOSIS — E11630 Type 2 diabetes mellitus with periodontal disease: Secondary | ICD-10-CM | POA: Diagnosis not present

## 2019-02-24 ENCOUNTER — Other Ambulatory Visit: Payer: Self-pay | Admitting: Family Medicine

## 2019-03-12 DIAGNOSIS — Z20828 Contact with and (suspected) exposure to other viral communicable diseases: Secondary | ICD-10-CM | POA: Diagnosis not present

## 2019-04-24 ENCOUNTER — Other Ambulatory Visit: Payer: Self-pay | Admitting: Family Medicine

## 2019-05-27 DIAGNOSIS — R69 Illness, unspecified: Secondary | ICD-10-CM | POA: Diagnosis not present

## 2019-07-10 ENCOUNTER — Other Ambulatory Visit: Payer: Self-pay | Admitting: Family Medicine

## 2019-07-10 MED ORDER — ATORVASTATIN CALCIUM 10 MG PO TABS: 10.0000 mg | ORAL_TABLET | Freq: Every day | ORAL | 3 refills | Status: DC

## 2019-07-23 ENCOUNTER — Telehealth: Payer: Self-pay | Admitting: Family Medicine

## 2019-07-23 MED ORDER — HYDROCHLOROTHIAZIDE 25 MG PO TABS: 25.0000 mg | ORAL_TABLET | Freq: Every day | ORAL | 2 refills | Status: DC

## 2019-07-23 NOTE — Telephone Encounter (Signed)
Medication called/sent to requested pharmacy  

## 2019-07-23 NOTE — Telephone Encounter (Signed)
CB# [phone] Refill Hydrochlorthiazide

## 2019-08-05 DIAGNOSIS — H2513 Age-related nuclear cataract, bilateral: Secondary | ICD-10-CM | POA: Diagnosis not present

## 2019-08-05 DIAGNOSIS — H5203 Hypermetropia, bilateral: Secondary | ICD-10-CM | POA: Diagnosis not present

## 2019-08-21 ENCOUNTER — Other Ambulatory Visit: Payer: Self-pay | Admitting: Family Medicine

## 2019-08-22 ENCOUNTER — Other Ambulatory Visit: Payer: Self-pay | Admitting: Family Medicine

## 2019-08-22 ENCOUNTER — Telehealth: Payer: Self-pay | Admitting: Family Medicine

## 2019-08-22 MED ORDER — AMOXICILLIN 500 MG PO CAPS: 2000.0000 mg | ORAL_CAPSULE | Freq: Once | ORAL | 0 refills | Status: DC

## 2019-08-22 MED ORDER — AMOXICILLIN 500 MG PO CAPS: 2000.0000 mg | ORAL_CAPSULE | Freq: Once | ORAL | 0 refills | Status: AC

## 2019-08-22 NOTE — Telephone Encounter (Signed)
CB# [phone] has dentist appointment on 08-28-18 can Dr.Pickard prescribe Amoxicillin x 4 pills to last a year. Pharmacy [facility] [address].

## 2019-08-28 DIAGNOSIS — R69 Illness, unspecified: Secondary | ICD-10-CM | POA: Diagnosis not present

## 2019-09-24 ENCOUNTER — Other Ambulatory Visit: Payer: Self-pay | Admitting: Family Medicine

## 2019-10-01 DIAGNOSIS — L570 Actinic keratosis: Secondary | ICD-10-CM | POA: Diagnosis not present

## 2019-10-01 DIAGNOSIS — D2262 Melanocytic nevi of left upper limb, including shoulder: Secondary | ICD-10-CM | POA: Diagnosis not present

## 2019-10-01 DIAGNOSIS — D1801 Hemangioma of skin and subcutaneous tissue: Secondary | ICD-10-CM | POA: Diagnosis not present

## 2019-10-01 DIAGNOSIS — D2261 Melanocytic nevi of right upper limb, including shoulder: Secondary | ICD-10-CM | POA: Diagnosis not present

## 2019-10-01 DIAGNOSIS — Z85828 Personal history of other malignant neoplasm of skin: Secondary | ICD-10-CM | POA: Diagnosis not present

## 2019-10-01 DIAGNOSIS — L814 Other melanin hyperpigmentation: Secondary | ICD-10-CM | POA: Diagnosis not present

## 2019-10-01 DIAGNOSIS — D225 Melanocytic nevi of trunk: Secondary | ICD-10-CM | POA: Diagnosis not present

## 2019-10-01 DIAGNOSIS — L821 Other seborrheic keratosis: Secondary | ICD-10-CM | POA: Diagnosis not present

## 2019-10-27 ENCOUNTER — Other Ambulatory Visit: Payer: Medicare HMO

## 2019-10-27 ENCOUNTER — Other Ambulatory Visit: Payer: Self-pay

## 2019-10-27 DIAGNOSIS — Z125 Encounter for screening for malignant neoplasm of prostate: Secondary | ICD-10-CM

## 2019-10-27 DIAGNOSIS — E785 Hyperlipidemia, unspecified: Secondary | ICD-10-CM | POA: Diagnosis not present

## 2019-10-27 DIAGNOSIS — I10 Essential (primary) hypertension: Secondary | ICD-10-CM

## 2019-10-27 DIAGNOSIS — Z0000 Encounter for general adult medical examination without abnormal findings: Secondary | ICD-10-CM | POA: Diagnosis not present

## 2019-10-28 ENCOUNTER — Ambulatory Visit: Payer: Medicare HMO | Admitting: Family Medicine

## 2019-10-28 LAB — CBC WITH DIFFERENTIAL/PLATELET
Absolute Monocytes: 822 cells/uL (ref 200–950)
Basophils Absolute: 83 cells/uL (ref 0–200)
Basophils Relative: 1 %
Eosinophils Absolute: 357 cells/uL (ref 15–500)
Eosinophils Relative: 4.3 %
HCT: 39 % (ref 38.5–50.0)
Hemoglobin: 13.2 g/dL (ref 13.2–17.1)
Lymphs Abs: 2573 cells/uL (ref 850–3900)
MCH: 29.4 pg (ref 27.0–33.0)
MCHC: 33.8 g/dL (ref 32.0–36.0)
MCV: 86.9 fL (ref 80.0–100.0)
MPV: 11.6 fL (ref 7.5–12.5)
Monocytes Relative: 9.9 %
Neutro Abs: 4465 cells/uL (ref 1500–7800)
Neutrophils Relative %: 53.8 %
Platelets: 205 10*3/uL (ref 140–400)
RBC: 4.49 10*6/uL (ref 4.20–5.80)
RDW: 13.1 % (ref 11.0–15.0)
Total Lymphocyte: 31 %
WBC: 8.3 10*3/uL (ref 3.8–10.8)

## 2019-10-28 LAB — COMPLETE METABOLIC PANEL WITH GFR
AG Ratio: 1.5 (calc) (ref 1.0–2.5)
ALT: 19 U/L (ref 9–46)
AST: 15 U/L (ref 10–35)
Albumin: 3.8 g/dL (ref 3.6–5.1)
Alkaline phosphatase (APISO): 36 U/L (ref 35–144)
BUN: 21 mg/dL (ref 7–25)
CO2: 27 mmol/L (ref 20–32)
Calcium: 9.4 mg/dL (ref 8.6–10.3)
Chloride: 106 mmol/L (ref 98–110)
Creat: 1.14 mg/dL (ref 0.70–1.18)
GFR, Est African American: 71 mL/min/{1.73_m2} (ref >=60)
GFR, Est Non African American: 61 mL/min/{1.73_m2} (ref >=60)
Globulin: 2.5 g/dL (calc) (ref 1.9–3.7)
Glucose, Bld: 115 mg/dL — ABNORMAL HIGH (ref 65–99)
Potassium: 4.2 mmol/L (ref 3.5–5.3)
Sodium: 140 mmol/L (ref 135–146)
Total Bilirubin: 0.4 mg/dL (ref 0.2–1.2)
Total Protein: 6.3 g/dL (ref 6.1–8.1)

## 2019-10-28 LAB — PSA: PSA: 3.4 ng/mL (ref <=4.0)

## 2019-10-28 LAB — LIPID PANEL
Cholesterol: 115 mg/dL (ref <200)
HDL: 38 mg/dL — ABNORMAL LOW (ref >=40)
LDL Cholesterol (Calc): 61 mg/dL (calc)
Non-HDL Cholesterol (Calc): 77 mg/dL (calc) (ref <130)
Total CHOL/HDL Ratio: 3 (calc) (ref <5.0)
Triglycerides: 84 mg/dL (ref <150)

## 2019-10-30 ENCOUNTER — Other Ambulatory Visit: Payer: Self-pay

## 2019-10-30 ENCOUNTER — Ambulatory Visit (INDEPENDENT_AMBULATORY_CARE_PROVIDER_SITE_OTHER): Payer: Medicare HMO | Admitting: Family Medicine

## 2019-10-30 VITALS — BP 130/78 | HR 61 | Temp 98.1°F | Ht 72.0 in | Wt 246.0 lb

## 2019-10-30 DIAGNOSIS — E7800 Pure hypercholesterolemia, unspecified: Secondary | ICD-10-CM

## 2019-10-30 DIAGNOSIS — E1165 Type 2 diabetes mellitus with hyperglycemia: Secondary | ICD-10-CM

## 2019-10-30 DIAGNOSIS — I10 Essential (primary) hypertension: Secondary | ICD-10-CM

## 2019-10-30 DIAGNOSIS — Z125 Encounter for screening for malignant neoplasm of prostate: Secondary | ICD-10-CM | POA: Diagnosis not present

## 2019-10-30 DIAGNOSIS — IMO0002 Reserved for concepts with insufficient information to code with codable children: Secondary | ICD-10-CM

## 2019-10-30 DIAGNOSIS — E118 Type 2 diabetes mellitus with unspecified complications: Secondary | ICD-10-CM

## 2019-10-30 DIAGNOSIS — I480 Paroxysmal atrial fibrillation: Secondary | ICD-10-CM

## 2019-10-30 DIAGNOSIS — I2510 Atherosclerotic heart disease of native coronary artery without angina pectoris: Secondary | ICD-10-CM
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
## 2019-12-02 DIAGNOSIS — R69 Illness, unspecified: Secondary | ICD-10-CM | POA: Diagnosis not present

## 2019-12-10 ENCOUNTER — Other Ambulatory Visit: Payer: Self-pay | Admitting: Family Medicine

## 2019-12-16 ENCOUNTER — Other Ambulatory Visit: Payer: Self-pay | Admitting: Family Medicine

## 2020-01-09 DIAGNOSIS — R69 Illness, unspecified: Secondary | ICD-10-CM | POA: Diagnosis not present

## 2020-02-02 DIAGNOSIS — Z7901 Long term (current) use of anticoagulants: Secondary | ICD-10-CM | POA: Diagnosis not present

## 2020-02-02 DIAGNOSIS — G8929 Other chronic pain: Secondary | ICD-10-CM | POA: Diagnosis not present

## 2020-02-02 DIAGNOSIS — I4891 Unspecified atrial fibrillation: Secondary | ICD-10-CM | POA: Diagnosis not present

## 2020-02-02 DIAGNOSIS — I2510 Atherosclerotic heart disease of native coronary artery without angina pectoris: Secondary | ICD-10-CM | POA: Diagnosis not present

## 2020-02-02 DIAGNOSIS — Z20822 Contact with and (suspected) exposure to covid-19: Secondary | ICD-10-CM | POA: Diagnosis not present

## 2020-02-02 DIAGNOSIS — E119 Type 2 diabetes mellitus without complications: Secondary | ICD-10-CM | POA: Diagnosis not present

## 2020-02-02 DIAGNOSIS — D6869 Other thrombophilia: Secondary | ICD-10-CM | POA: Diagnosis not present

## 2020-02-02 DIAGNOSIS — I10 Essential (primary) hypertension: Secondary | ICD-10-CM | POA: Diagnosis not present

## 2020-02-02 DIAGNOSIS — E785 Hyperlipidemia, unspecified: Secondary | ICD-10-CM | POA: Diagnosis not present

## 2020-02-02 DIAGNOSIS — M1990 Unspecified osteoarthritis, unspecified site: Secondary | ICD-10-CM | POA: Diagnosis not present

## 2020-02-05 DIAGNOSIS — R69 Illness, unspecified: Secondary | ICD-10-CM | POA: Diagnosis not present

## 2020-02-18 ENCOUNTER — Other Ambulatory Visit: Payer: Self-pay | Admitting: Family Medicine

## 2020-02-19 ENCOUNTER — Other Ambulatory Visit: Payer: Self-pay | Admitting: Family Medicine

## 2020-02-19 MED ORDER — AMOXICILLIN 500 MG PO CAPS: ORAL_CAPSULE | ORAL | 0 refills | Status: DC

## 2020-02-19 NOTE — Addendum Note (Signed)
Addended by: Sheral Flow on: 02/19/2020 02:08 PM   Modules accepted: Orders

## 2020-03-03 ENCOUNTER — Other Ambulatory Visit: Payer: Self-pay | Admitting: Family Medicine

## 2020-03-25 ENCOUNTER — Other Ambulatory Visit: Payer: Self-pay | Admitting: Family Medicine

## 2020-03-29 ENCOUNTER — Telehealth: Payer: Self-pay | Admitting: Family Medicine

## 2020-03-29 MED ORDER — METOPROLOL SUCCINATE ER 25 MG PO TB24: ORAL_TABLET | ORAL | 3 refills | Status: DC

## 2020-03-29 NOTE — Telephone Encounter (Signed)
Pharmacy faxed refill request for  metoprolol succinate (TOPROL-XL) 25 MG 24 hr tablet [[id number]]

## 2020-03-29 NOTE — Telephone Encounter (Signed)
Prescription sent to pharmacy.

## 2020-05-28 ENCOUNTER — Other Ambulatory Visit: Payer: Self-pay | Admitting: Family Medicine

## 2020-06-08 ENCOUNTER — Other Ambulatory Visit: Payer: Self-pay

## 2020-06-08 ENCOUNTER — Telehealth: Payer: Self-pay | Admitting: Family Medicine

## 2020-06-08 MED ORDER — AMOXICILLIN 500 MG PO CAPS: ORAL_CAPSULE | ORAL | 0 refills | Status: DC

## 2020-06-08 NOTE — Telephone Encounter (Signed)
Pt came in needing a refill of  amoxicillin (AMOXIL) 500 MG capsule For a dentist appointment.Pt is needing 4 pills as he gets his teeth cleaned 4 times a year. Cb#: [phone]

## 2020-07-13 ENCOUNTER — Telehealth: Payer: Self-pay | Admitting: Family Medicine

## 2020-07-13 NOTE — Telephone Encounter (Signed)
Copied from [place] [phone]. Topic: Medicare AWV >> Jul 13, 2020  2:00 PM Cher Nakai R wrote: Reason for CRM: Left message for patient to call back and schedule Medicare Annual Wellness Visit (AWV) in office.   If not able to come in office, please offer to do virtually or by telephone.   Last AWV: 09/10/2018  Please schedule at anytime with [facility]-Nurse Health Advisor.  If any questions, please contact me at [phone]
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
## 2020-07-15 ENCOUNTER — Other Ambulatory Visit: Payer: Self-pay

## 2020-07-15 ENCOUNTER — Ambulatory Visit (INDEPENDENT_AMBULATORY_CARE_PROVIDER_SITE_OTHER): Payer: Medicare HMO

## 2020-07-15 VITALS — BP 160/70 | HR 83 | Temp 97.8°F | Wt 242.1 lb

## 2020-07-15 DIAGNOSIS — Z0000 Encounter for general adult medical examination without abnormal findings: Secondary | ICD-10-CM

## 2020-07-15 NOTE — Patient Instructions (Signed)
[name] , Thank you for taking time to come for your Medicare Wellness Visit. I appreciate your ongoing commitment to your health goals. Please review the following plan we discussed and let me know if I can assist you in the future.   Screening recommendations/referrals: Colonoscopy: No longer required  Recommended yearly ophthalmology/optometry visit for glaucoma screening and checkup Recommended yearly dental visit for hygiene and checkup  Vaccinations: Influenza vaccine: Up to date, next due fall 2022  Pneumococcal vaccine: Completed series  Tdap vaccine: Up to date 09/11/2028 Shingles vaccine: Completed series     Advanced directives: Please bring in copies of your advanced medical directives   Conditions/risks identified: None   Next appointment: 07/22/2021 @ 9:45 am with Nurse Health Advisor   Preventive Care 65 Years and Older, Male Preventive care refers to lifestyle choices and visits with your health care provider that can promote health and wellness. What does preventive care include?  A yearly physical exam. This is also called an annual well check.  Dental exams once or twice a year.  Routine eye exams. Ask your health care provider how often you should have your eyes checked.  Personal lifestyle choices, including:  Daily care of your teeth and gums.  Regular physical activity.  Eating a healthy diet.  Avoiding tobacco and drug use.  Limiting alcohol use.  Practicing safe sex.  Taking low doses of aspirin every day.  Taking vitamin and mineral supplements as recommended by your health care provider. What happens during an annual well check? The services and screenings done by your health care provider during your annual well check will depend on your age, overall health, lifestyle risk factors, and family history of disease. Counseling  Your health care provider may ask you questions about your:  Alcohol use.  Tobacco use.  Drug  use.  Emotional well-being.  Home and relationship well-being.  Sexual activity.  Eating habits.  History of falls.  Memory and ability to understand (cognition).  Work and work Statistician. Screening  You may have the following tests or measurements:  Height, weight, and BMI.  Blood pressure.  Lipid and cholesterol levels. These may be checked every 5 years, or more frequently if you are over 37 years old.  Skin check.  Lung cancer screening. You may have this screening every year starting at age 47 if you have a 30-pack-year history of smoking and currently smoke or have quit within the past 15 years.  Fecal occult blood test (FOBT) of the stool. You may have this test every year starting at age 69.  Flexible sigmoidoscopy or colonoscopy. You may have a sigmoidoscopy every 5 years or a colonoscopy every 10 years starting at age 83.  Prostate cancer screening. Recommendations will vary depending on your family history and other risks.  Hepatitis C blood test.  Hepatitis B blood test.  Sexually transmitted disease (STD) testing.  Diabetes screening. This is done by checking your blood sugar (glucose) after you have not eaten for a while (fasting). You may have this done every 1-3 years.  Abdominal aortic aneurysm (AAA) screening. You may need this if you are a current or former smoker.  Osteoporosis. You may be screened starting at age 60 if you are at high risk. Talk with your health care provider about your test results, treatment options, and if necessary, the need for more tests. Vaccines  Your health care provider may recommend certain vaccines, such as:  Influenza vaccine. This is recommended every year.  Tetanus, diphtheria, and acellular pertussis (Tdap, Td) vaccine. You may need a Td booster every 10 years.  Zoster vaccine. You may need this after age 11.  Pneumococcal 13-valent conjugate (PCV13) vaccine. One dose is recommended after age  6.  Pneumococcal polysaccharide (PPSV23) vaccine. One dose is recommended after age 69. Talk to your health care provider about which screenings and vaccines you need and how often you need them. This information is not intended to replace advice given to you by your health care provider. Make sure you discuss any questions you have with your health care provider. Document Released: 03/19/2015 Document Revised: 11/10/2015 Document Reviewed: 12/22/2014 Elsevier Interactive Patient Education  2017 [place] Prevention in the Home Falls can cause injuries. They can happen to people of all ages. There are many things you can do to make your home safe and to help prevent falls. What can I do on the outside of my home?  Regularly fix the edges of walkways and driveways and fix any cracks.  Remove anything that might make you trip as you walk through a door, such as a raised step or threshold.  Trim any bushes or trees on the path to your home.  Use bright outdoor lighting.  Clear any walking paths of anything that might make someone trip, such as rocks or tools.  Regularly check to see if handrails are loose or broken. Make sure that both sides of any steps have handrails.  Any raised decks and porches should have guardrails on the edges.  Have any leaves, snow, or ice cleared regularly.  Use sand or salt on walking paths during winter.  Clean up any spills in your garage right away. This includes oil or grease spills. What can I do in the bathroom?  Use night lights.  Install grab bars by the toilet and in the tub and shower. Do not use towel bars as grab bars.  Use non-skid mats or decals in the tub or shower.  If you need to sit down in the shower, use a plastic, non-slip stool.  Keep the floor dry. Clean up any water that spills on the floor as soon as it happens.  Remove soap buildup in the tub or shower regularly.  Attach bath mats securely with double-sided  non-slip rug tape.  Do not have throw rugs and other things on the floor that can make you trip. What can I do in the bedroom?  Use night lights.  Make sure that you have a light by your bed that is easy to reach.  Do not use any sheets or blankets that are too big for your bed. They should not hang down onto the floor.  Have a firm chair that has side arms. You can use this for support while you get dressed.  Do not have throw rugs and other things on the floor that can make you trip. What can I do in the kitchen?  Clean up any spills right away.  Avoid walking on wet floors.  Keep items that you use a lot in easy-to-reach places.  If you need to reach something above you, use a strong step stool that has a grab bar.  Keep electrical cords out of the way.  Do not use floor polish or wax that makes floors slippery. If you must use wax, use non-skid floor wax.  Do not have throw rugs and other things on the floor that can make you trip. What can I do  with my stairs?  Do not leave any items on the stairs.  Make sure that there are handrails on both sides of the stairs and use them. Fix handrails that are broken or loose. Make sure that handrails are as long as the stairways.  Check any carpeting to make sure that it is firmly attached to the stairs. Fix any carpet that is loose or worn.  Avoid having throw rugs at the top or bottom of the stairs. If you do have throw rugs, attach them to the floor with carpet tape.  Make sure that you have a light switch at the top of the stairs and the bottom of the stairs. If you do not have them, ask someone to add them for you. What else can I do to help prevent falls?  Wear shoes that:  Do not have high heels.  Have rubber bottoms.  Are comfortable and fit you well.  Are closed at the toe. Do not wear sandals.  If you use a stepladder:  Make sure that it is fully opened. Do not climb a closed stepladder.  Make sure that both  sides of the stepladder are locked into place.  Ask someone to hold it for you, if possible.  Clearly mark and make sure that you can see:  Any grab bars or handrails.  First and last steps.  Where the edge of each step is.  Use tools that help you move around (mobility aids) if they are needed. These include:  Canes.  Walkers.  Scooters.  Crutches.  Turn on the lights when you go into a dark area. Replace any light bulbs as soon as they burn out.  Set up your furniture so you have a clear path. Avoid moving your furniture around.  If any of your floors are uneven, fix them.  If there are any pets around you, be aware of where they are.  Review your medicines with your doctor. Some medicines can make you feel dizzy. This can increase your chance of falling. Ask your doctor what other things that you can do to help prevent falls. This information is not intended to replace advice given to you by your health care provider. Make sure you discuss any questions you have with your health care provider. Document Released: 12/17/2008 Document Revised: 07/29/2015 Document Reviewed: 03/27/2014 Elsevier Interactive Patient Education  2017 [employer].

## 2020-07-22 ENCOUNTER — Other Ambulatory Visit: Payer: Self-pay | Admitting: Family Medicine

## 2020-08-30 ENCOUNTER — Telehealth: Payer: Self-pay | Admitting: *Deleted

## 2020-08-30 NOTE — Chronic Care Management (AMB) (Signed)
  Chronic Care Management   Note  08/30/2020 Name: Henry Martin MRN: [id number] DOB: 1940-10-03  [name] is a 80 y.o. year old male who is a primary care patient of Dennard Schaumann, Cammie Mcgee, MD. I reached out to Kirstie Mirza by phone today in response to a referral sent by [name] PCP, Dr. Dennard Schaumann      [name] was given information about Chronic Care Management services today including:  CCM service includes personalized support from designated clinical staff supervised by his physician, including individualized plan of care and coordination with other care providers 24/7 contact phone numbers for assistance for urgent and routine care needs. Service will only be billed when office clinical staff spend 20 minutes or more in a month to coordinate care. Only one practitioner may furnish and bill the service in a calendar month. The patient may stop CCM services at any time (effective at the end of the month) by phone call to the office staff. The patient will be responsible for cost sharing (co-pay) of up to 20% of the service fee (after annual deductible is met).  Patient agreed to services and verbal consent obtained.   Follow up plan: Telephone appointment with care management team member scheduled for:09/17/2020  [place] [facility]

## 2020-09-17 ENCOUNTER — Ambulatory Visit (INDEPENDENT_AMBULATORY_CARE_PROVIDER_SITE_OTHER): Payer: Medicare HMO | Admitting: *Deleted

## 2020-09-17 DIAGNOSIS — I480 Paroxysmal atrial fibrillation: Secondary | ICD-10-CM | POA: Diagnosis not present

## 2020-09-17 DIAGNOSIS — I10 Essential (primary) hypertension: Secondary | ICD-10-CM | POA: Diagnosis not present

## 2020-09-17 NOTE — Patient Instructions (Signed)
Visit Information   PATIENT GOALS:   Goals Addressed             This Visit's Progress    Track and Manage Heart Rate and Rhythm-Atrial Fibrillation       Timeframe:  Long-Range Goal Priority:  Medium Start Date:        09/17/2020                     Expected End Date:       03/20/2021                Follow Up Date - 10/29/2021   - begin a symptom diary - make a plan to exercise regularly - make a plan to eat healthy - take medicine as prescribed , reviewed importance of blood thinner - make your annual wellness visit appointment for this year   Why is this important?   Atrial fibrillation may have no symptoms. Sometimes the symptoms get worse or happen more often.  It is important to keep track of what your symptoms are and when they happen.  A change in symptoms is important to discuss with your doctor or nurse.  Being active and healthy eating will also help you manage your heart condition.     Notes:      Track and Manage My Blood Pressure-Hypertension       Timeframe:  Long-Range Goal Priority:  Medium Start Date:       09/17/2020                      Expected End Date:      03/20/2021                 Follow Up Date - 10/29/2020   - check blood pressure 3 times per week - choose a place to take my blood pressure (home, clinic or office, retail store) - write blood pressure results in a log or diary  - follow low sodium diet - look over education mailed to you for low sodium diet - follow up with primary care provider as needed   Why is this important?   You won't feel high blood pressure, but it can still hurt your blood vessels.  High blood pressure can cause heart or kidney problems. It can also cause a stroke.  Making lifestyle changes like losing a little weight or eating less salt will help.  Checking your blood pressure at home and at different times of the day can help to control blood pressure.  If the doctor prescribes medicine remember to take it the way  the doctor ordered.  Call the office if you cannot afford the medicine or if there are questions about it.     Notes:         Consent to CCM Services: [name] was given information about Chronic Care Management services today including:  CCM service includes personalized support from designated clinical staff supervised by his physician, including individualized plan of care and coordination with other care providers 24/7 contact phone numbers for assistance for urgent and routine care needs. Service will only be billed when office clinical staff spend 20 minutes or more in a month to coordinate care. Only one practitioner may furnish and bill the service in a calendar month. The patient may stop CCM services at any time (effective at the end of the month) by phone call to the office staff. The patient will be  responsible for cost sharing (co-pay) of up to 20% of the service fee (after annual deductible is met).  Patient agreed to services and verbal consent obtained.   The patient verbalized understanding of instructions, educational materials, and care plan provided today and agreed to receive a mailed copy of patient instructions, educational materials, and care plan.   Telephone follow up appointment with care management team member scheduled for:  8/26/2022Atrial Fibrillation  Atrial fibrillation is a type of irregular or rapid heartbeat (arrhythmia). In atrial fibrillation, the top part of the heart (atria) beats in an irregular pattern. This makes the heart unable to pump bloodnormally and effectively. The goal of treatment is to prevent blood clots from forming, control your heart rate, or restore your heartbeat to a normal rhythm. If this condition is not treated, it can cause serious problems, such as a weakened heart muscle (cardiomyopathy) or a stroke. What are the causes? This condition is often caused by medical conditions that damage the heart's electrical system. These  include: High blood pressure (hypertension). This is the most common cause. Certain heart problems or conditions, such as heart failure, coronary artery disease, heart valve problems, or heart surgery. Diabetes. Overactive thyroid (hyperthyroidism). Obesity. Chronic kidney disease. In some cases, the cause of this condition is not known. What increases the risk? This condition is more likely to develop in: Older people. People who smoke. Athletes who do endurance exercise. People who have a family history of atrial fibrillation. Men. People who use drugs. People who drink a lot of alcohol. People who have lung conditions, such as emphysema, pneumonia, or COPD. People who have obstructive sleep apnea. What are the signs or symptoms? Symptoms of this condition include: A feeling that your heart is racing or beating irregularly. Discomfort or pain in your chest. Shortness of breath. Sudden light-headedness or weakness. Tiring easily during exercise or activity. Fatigue. Syncope (fainting). Sweating. In some cases, there are no symptoms. How is this diagnosed? Your health care provider may detect atrial fibrillation when taking your pulse. If detected, this condition may be diagnosed with: An electrocardiogram (ECG) to check electrical signals of the heart. An ambulatory cardiac monitor to record your heart's activity for a few days. A transthoracic echocardiogram (TTE) to create pictures of your heart. A transesophageal echocardiogram (TEE) to create even closer pictures of your heart. A stress test to check your blood supply while you exercise. Imaging tests, such as a CT scan or chest X-ray. Blood tests. How is this treated? Treatment depends on underlying conditions and how you feel when you experience atrial fibrillation. This condition may be treated with: Medicines to prevent blood clots or to treat heart rate or heart rhythm problems. Electrical cardioversion to reset  the heart's rhythm. A pacemaker to correct abnormal heart rhythm. Ablation to remove the heart tissue that sends abnormal signals. Left atrial appendage closure to seal the area where blood clots can form. In some cases, underlying conditions will be treated. Follow these instructions at home: Medicines Take over-the counter and prescription medicines only as told by your health care provider. Do not take any new medicines without talking to your health care provider. If you are taking blood thinners: Talk with your health care provider before you take any medicines that contain aspirin or NSAIDs, such as ibuprofen. These medicines increase your risk for dangerous bleeding. Take your medicine exactly as told, at the same time every day. Avoid activities that could cause injury or bruising, and follow instructions about how  to prevent falls. Wear a medical alert bracelet or carry a card that lists what medicines you take. Lifestyle     Do not use any products that contain nicotine or tobacco, such as cigarettes, e-cigarettes, and chewing tobacco. If you need help quitting, ask your health care provider. Eat heart-healthy foods. Talk with a dietitian to make an eating plan that is right for you. Exercise regularly as told by your health care provider. Do not drink alcohol. Lose weight if you are overweight. Do not use drugs, including cannabis. General instructions If you have obstructive sleep apnea, manage your condition as told by your health care provider. Do not use diet pills unless your health care provider approves. Diet pills can make heart problems worse. Keep all follow-up visits as told by your health care provider. This is important. Contact a health care provider if you: Notice a change in the rate, rhythm, or strength of your heartbeat. Are taking a blood thinner and you notice more bruising. Tire more easily when you exercise or do heavy work. Have a sudden change in  weight. Get help right away if you have:  Chest pain, abdominal pain, sweating, or weakness. Trouble breathing. Side effects of blood thinners, such as blood in your vomit, stool, or urine, or bleeding that cannot stop. Any symptoms of a stroke. "BE FAST" is an easy way to remember the main warning signs of a stroke: B - Balance. Signs are dizziness, sudden trouble walking, or loss of balance. E - Eyes. Signs are trouble seeing or a sudden change in vision. F - Face. Signs are sudden weakness or numbness of the face, or the face or eyelid drooping on one side. A - Arms. Signs are weakness or numbness in an arm. This happens suddenly and usually on one side of the body. S - Speech. Signs are sudden trouble speaking, slurred speech, or trouble understanding what people say. T - Time. Time to call emergency services. Write down what time symptoms started. Other signs of a stroke, such as: A sudden, severe headache with no known cause. Nausea or vomiting. Seizure. These symptoms may represent a serious problem that is an emergency. Do not wait to see if the symptoms will go away. Get medical help right away. Call your local emergency services (911 in the [place] Do not drive yourself to the hospital. Summary Atrial fibrillation is a type of irregular or rapid heartbeat (arrhythmia). Symptoms include a feeling that your heart is beating fast or irregularly. You may be given medicines to prevent blood clots or to treat heart rate or heart rhythm problems. Get help right away if you have signs or symptoms of a stroke. Get help right away if you cannot catch your breath or have chest pain or pressure. This information is not intended to replace advice given to you by your health care provider. Make sure you discuss any questions you have with your healthcare provider. Document Revised: 08/14/2018 Document Reviewed: 08/14/2018 Elsevier Patient Education  [place]. Low-Sodium Eating  Plan Sodium, which is an element that makes up salt, helps you maintain a healthy balance of fluids in your body. Too much sodium can increase your bloodpressure and cause fluid and waste to be held in your body. Your health care provider or dietitian may recommend following this plan if you have high blood pressure (hypertension), kidney disease, liver disease, or heart failure. Eating less sodium can help lower your blood pressure, reduce swelling, and protect your heart, liver,  andkidneys. What are tips for following this plan? Reading food labels The Nutrition Facts label lists the amount of sodium in one serving of the food. If you eat more than one serving, you must multiply the listed amount of sodium by the number of servings. Choose foods with less than 140 mg of sodium per serving. Avoid foods with 300 mg of sodium or more per serving. Shopping  Look for lower-sodium products, often labeled as "low-sodium" or "no salt added." Always check the sodium content, even if foods are labeled as "unsalted" or "no salt added." Buy fresh foods. Avoid canned foods and pre-made or frozen meals. Avoid canned, cured, or processed meats. Buy breads that have less than 80 mg of sodium per slice.  Cooking  Eat more home-cooked food and less restaurant, buffet, and fast food. Avoid adding salt when cooking. Use salt-free seasonings or herbs instead of table salt or sea salt. Check with your health care provider or pharmacist before using salt substitutes. Cook with plant-based oils, such as canola, sunflower, or olive oil.  Meal planning When eating at a restaurant, ask that your food be prepared with less salt or no salt, if possible. Avoid dishes labeled as brined, pickled, cured, smoked, or made with soy sauce, miso, or teriyaki sauce. Avoid foods that contain MSG (monosodium glutamate). MSG is sometimes added to [place] food, bouillon, and some canned foods. Make meals that can be grilled,  baked, poached, roasted, or steamed. These are generally made with less sodium. General information Most people on this plan should limit their sodium intake to 1,500-2,000 mg (milligrams) of sodium each day. What foods should I eat? Fruits Fresh, frozen, or canned fruit. Fruit juice. Vegetables Fresh or frozen vegetables. "No salt added" canned vegetables. "No salt added"tomato sauce and paste. Low-sodium or reduced-sodium tomato and vegetable juice. Grains Low-sodium cereals, including oats, puffed wheat and rice, and shredded wheat. Low-sodium crackers. Unsalted rice. Unsalted pasta. Low-sodium bread.Whole-grain breads and whole-grain pasta. Meats and other proteins Fresh or frozen (no salt added) meat, poultry, seafood, and fish. Low-sodium canned tuna and salmon. Unsalted nuts. Dried peas, beans, and lentils withoutadded salt. Unsalted canned beans. Eggs. Unsalted nut butters. Dairy Milk. Soy milk. Cheese that is naturally low in sodium, such as ricotta cheese, fresh mozzarella, or Swiss cheese. Low-sodium or reduced-sodium cheese. Creamcheese. Yogurt. Seasonings and condiments Fresh and dried herbs and spices. Salt-free seasonings. Low-sodium mustard and ketchup. Sodium-free salad dressing. Sodium-free light mayonnaise. Fresh orrefrigerated horseradish. Lemon juice. Vinegar. Other foods Homemade, reduced-sodium, or low-sodium soups. Unsalted popcorn and pretzels.Low-salt or salt-free chips. The items listed above may not be a complete list of foods and beverages you can eat. Contact a dietitian for more information. What foods should I avoid? Vegetables Sauerkraut, pickled vegetables, and relishes. Olives. [place] fries. Onion rings. Regular canned vegetables (not low-sodium or reduced-sodium). Regular canned tomato sauce and paste (not low-sodium or reduced-sodium). Regular tomato and vegetable juice (not low-sodium or reduced-sodium). Frozenvegetables in sauces. Grains Instant hot  cereals. Bread stuffing, pancake, and biscuit mixes. Croutons. Seasoned rice or pasta mixes. Noodle soup cups. Boxed or frozen macaroni andcheese. Regular salted crackers. Self-rising flour. Meats and other proteins Meat or fish that is salted, canned, smoked, spiced, or pickled. Precooked or cured meat, such as sausages or meat loaves. [place] Salines. Ham. Pepperoni. Hot dogs. Corned beef. Chipped beef. Salt pork. Jerky. Pickled herring. Anchovies andsardines. Regular canned tuna. Salted nuts. Dairy Processed cheese and cheese spreads. Hard cheeses. Cheese curds. Blue cheese.Feta cheese. String cheese.  Regular cottage cheese. Buttermilk. Canned milk. Fats and oils Salted butter. Regular margarine. Ghee. Bacon fat. Seasonings and condiments Onion salt, garlic salt, seasoned salt, table salt, and sea salt. Canned and packaged gravies. Worcestershire sauce. Tartar sauce. Barbecue sauce. Teriyaki sauce. Soy sauce, including reduced-sodium. Steak sauce. Fish sauce. Oyster sauce. Cocktail sauce. Horseradish that you find on the shelf. Regular ketchup and mustard. Meat flavorings and tenderizers. Bouillon cubes. Hot sauce. Pre-made or packaged marinades. Pre-made or packaged taco seasonings. Relishes.Regular salad dressings. Salsa. Other foods Salted popcorn and pretzels. Corn chips and puffs. Potato and tortilla chips.Canned or dried soups. Pizza. Frozen entrees and pot pies. The items listed above may not be a complete list of foods and beverages you should avoid. Contact a dietitian for more information. Summary Eating less sodium can help lower your blood pressure, reduce swelling, and protect your heart, liver, and kidneys. Most people on this plan should limit their sodium intake to 1,500-2,000 mg (milligrams) of sodium each day. Canned, boxed, and frozen foods are high in sodium. Restaurant foods, fast foods, and pizza are also very high in sodium. You also get sodium by adding salt to food. Try to cook at  home, eat more fresh fruits and vegetables, and eat less fast food and canned, processed, or prepared foods. This information is not intended to replace advice given to you by your health care provider. Make sure you discuss any questions you have with your healthcare provider. Document Revised: 03/28/2019 Document Reviewed: 01/22/2019 Elsevier Patient Education  2022 [place] [facility], BSN RN Case Manager [facility] [phone]   CLINICAL CARE PLAN: Patient Care Plan: Atrial Fibrillation (Adult)     Problem Identified: Dysrhythmia (Atrial Fibrillation)   Priority: Medium     Long-Range Goal: Heart Rate and Rhythm Monitored and Managed   Start Date: 09/17/2020  Expected End Date: 03/20/2021  This Visit's Progress: On track  Priority: Medium  Note:   Current Barriers:  Ineffective Self Health Maintenance in a patient with Atrial Fibrillation- can benefit from reinforcement on Atrial Fibrillation action plan, pt lives with spouse (has dementia) and has assistance in the home for her, daughter will be moving to assist with her mother.  Pt feels he is managing " pretty well" with health conditions at present, has medications and taking as prescribed, states he needs to make AWV appointment for this year. Clinical Goal(s):  Collaboration with Susy Frizzle, MD regarding development and update of comprehensive plan of care as evidenced by provider attestation and co-signature Inter-disciplinary care team collaboration (see longitudinal plan of care) patient will work with care management team to address care coordination and chronic disease management needs related to Disease Management Educational Needs Care Coordination Medication Management and Education   Interventions:  Evaluation of current treatment plan related to Atrial Fibrillation and HTN,  self-management and patient's adherence to plan as established by provider. Collaboration with  Susy Frizzle, MD regarding development and update of comprehensive plan of care as evidenced by provider attestation       and co-signature Inter-disciplinary care team collaboration (see longitudinal plan of care) Discussed plans with patient for ongoing care management follow up and provided patient with direct contact information for care management team Mailed education to patient's home Atrial fibrillation Reviewed importance of taking medications as prescribed, importance of beta-blocker and blood thinner Gave RN care manager contact information to patient Reviewed signs/ symptoms Atrial fibrillation exacerbation, importance of keeping in touch with doctor  for symptom management Encouraged pt to make his Annual Wellness visit appointment for this year Self Care Activities:  Attends all scheduled provider appointments Calls provider office for new concerns or questions Patient Goals: - begin a symptom diary - make a plan to exercise regularly - make a plan to eat healthy - take medicine as prescribed , reviewed importance of blood thinner - make your annual wellness visit appointment for this year Follow Up Plan: Telephone follow up appointment with care management team member scheduled for:  10/29/2020    Patient Care Plan: Hypertension (Adult)     Problem Identified: Hypertension (Hypertension)   Priority: Medium     Long-Range Goal: Hypertension Monitored   Start Date: 09/17/2020  Expected End Date: 03/20/2021  This Visit's Progress: On track  Priority: Medium  Note:   Objective:  Last practice recorded BP readings:  BP Readings from Last 3 Encounters:  07/15/20 (!) 160/70  10/30/19 130/78  10/15/18 128/78   Most recent eGFR/CrCl: No results found for: EGFR  No components found for: CRCL Current Barriers:  Knowledge Deficits related to basic understanding of hypertension pathophysiology and self care management-  needs reinforcement of low sodium diet, importance  of keeping blood pressure managed for overall health, pt reports he checks BP on occasion, eats fast food " sometimes', is part time [employer] and stays busy, gets out for church and other social activities at times, lives with spouse and he helps take care of her. Case Manager Clinical Goal(s):  patient will verbalize understanding of plan for hypertension management patient will attend all scheduled medical appointments: needs to make appointment for annual wellness visit for this year patient will demonstrate improved adherence to prescribed treatment plan for hypertension as evidenced by taking all medications as prescribed, monitoring and recording blood pressure as directed, adhering to low sodium/DASH diet Interventions:  Collaboration with Susy Frizzle, MD regarding development and update of comprehensive plan of care as evidenced by provider attestation and co-signature Inter-disciplinary care team collaboration (see longitudinal plan of care) Evaluation of current treatment plan related to hypertension self management and patient's adherence to plan as established by provider. Provided education to patient re: stroke prevention, s/s of heart attack and stroke, DASH diet, complications of uncontrolled blood pressure Reviewed medications with patient and discussed importance of compliance Discussed plans with patient for ongoing care management follow up and provided patient with direct contact information for care management team Advised patient, providing education and rationale, to monitor blood pressure 3 times week and record, calling PCP for findings outside established parameters.  Reviewed scheduled/upcoming provider appointments including: Annual Wellness Visit Provided patient with RN care manager contact number Mailed education (low sodium diet) to patient's home Self-Care Activities: Attends all scheduled provider appointments Calls provider office for new concerns, questions,  or BP outside discussed parameters Follows a low sodium diet/DASH diet Patient Goals: - check blood pressure 3 times per week - choose a place to take my blood pressure (home, clinic or office, retail store) - write blood pressure results in a log or diary  - follow low sodium diet - look over education mailed to you for low sodium diet - follow up with primary care provider as needed Follow Up Plan: Telephone follow up appointment with care management team member scheduled for:  10/29/2020

## 2020-09-17 NOTE — Chronic Care Management (AMB) (Signed)
Chronic Care Management   CCM RN Visit Note  09/17/2020 Name: Henry Martin MRN: [id number] DOB: 06/23/1940  Subjective: Henry Martin is a 80 y.o. year old male who is a primary care patient of Pickard, Cammie Mcgee, MD. The care management team was consulted for assistance with disease management and care coordination needs.    Engaged with patient by telephone for initial visit in response to provider referral for case management and/or care coordination services.   Consent to Services:  The patient was given the following information about Chronic Care Management services today, agreed to services, and gave verbal consent: 1. CCM service includes personalized support from designated clinical staff supervised by the primary care provider, including individualized plan of care and coordination with other care providers 2. 24/7 contact phone numbers for assistance for urgent and routine care needs. 3. Service will only be billed when office clinical staff spend 20 minutes or more in a month to coordinate care. 4. Only one practitioner may furnish and bill the service in a calendar month. 5.The patient may stop CCM services at any time (effective at the end of the month) by phone call to the office staff. 6. The patient will be responsible for cost sharing (co-pay) of up to 20% of the service fee (after annual deductible is met). Patient agreed to services and consent obtained.  Patient agreed to services and verbal consent obtained.   Assessment: Review of patient past medical history, allergies, medications, health status, including review of consultants reports, laboratory and other test data, was performed as part of comprehensive evaluation and provision of chronic care management services.   SDOH (Social Determinants of Health) assessments and interventions performed:  SDOH Interventions    Flowsheet Row Most Recent Value  SDOH Interventions   Food Insecurity Interventions Intervention Not  Indicated  Transportation Interventions Intervention Not Indicated        CCM Care Plan  Allergies  Allergen Reactions   Contrast Media [Iodinated Diagnostic Agents] Other (See Comments)    "Isovue"; "blisters; all down my legs"   Metrizamide Other (See Comments)    "Isovue"; "blisters; all down my legs"    Outpatient Encounter Medications as of 09/17/2020  Medication Sig   acetaminophen (TYLENOL) 500 MG tablet Take 1,000 mg by mouth daily as needed for moderate pain or headache.   atorvastatin (LIPITOR) 10 MG tablet TAKE 1 TABLET DAILY   Blood Glucose Monitoring Suppl (ACCU-CHEK AVIVA PLUS) w/Device KIT Use to check BS QAM   Cholecalciferol (VITAMIN D3) 1000 UNITS CAPS Take 1,000 Units by mouth daily.    Fish Oil OIL Take 800 mg by mouth 2 (two) times daily.    glucose blood (ACCU-CHEK AVIVA PLUS) test strip Check BC QAM DX: e11.9   hydrochlorothiazide (HYDRODIURIL) 25 MG tablet TAKE 1 TABLET BY MOUTH ONCE A DAY   Lancets (ACCU-CHEK MULTICLIX) lancets Check BC QAM DX: e11.9   Lancets Misc. (ACCU-CHEK MULTICLIX LANCET DEV) KIT Check BC QAM DX: e11.9   metFORMIN (GLUCOPHAGE) 500 MG tablet TAKE 2 TABLETS BY MOUTH  DAILY WITH A MEAL   metoprolol succinate (TOPROL-XL) 25 MG 24 hr tablet TAKE 1/2 TABLET (12.5MG) ATBEDTIME   olmesartan (BENICAR) 40 MG tablet TAKE 1 TABLET DAILY   XARELTO 20 MG TABS tablet TAKE 1 TABLET BY MOUTH ONCE DAILY   amoxicillin (AMOXIL) 500 MG capsule TAKE 4 CAPSULES BY MOUTH ONCE FOR 1 DOSE (Patient not taking: No sig reported)   tadalafil (CIALIS) 20 MG tablet Take 1  tablet (20 mg total) by mouth daily as needed for erectile dysfunction. (Patient not taking: No sig reported)   No facility-administered encounter medications on file as of 09/17/2020.    Patient Active Problem List   Diagnosis Date Noted   OA (osteoarthritis) of knee 11/14/2017   Chronic anticoagulation 02/01/2016   History of stroke 02/01/2016   Sleep apnea 02/01/2016   Personal history of  colonic polyps 12/26/2012   Diarrhea 07/05/2011   PAF (paroxysmal atrial fibrillation) ([place]) 07/04/2011   HLD (hyperlipidemia) 03/23/2008   Essential hypertension 03/23/2008   CAD (coronary artery disease) 03/23/2008    Conditions to be addressed/monitored:Atrial Fibrillation and HTN  Care Plan : Atrial Fibrillation (Adult)  Updates made by Kassie Mends, RN since 09/17/2020 12:00 AM     Problem: Dysrhythmia (Atrial Fibrillation)   Priority: Medium     Long-Range Goal: Heart Rate and Rhythm Monitored and Managed   Start Date: 09/17/2020  Expected End Date: 03/20/2021  This Visit's Progress: On track  Priority: Medium  Note:   Current Barriers:  Ineffective Self Health Maintenance in a patient with Atrial Fibrillation- can benefit from reinforcement on Atrial Fibrillation action plan, pt lives with spouse (has dementia) and has assistance in the home for her, daughter will be moving to assist with her mother.  Pt feels he is managing " pretty well" with health conditions at present, has medications and taking as prescribed, states he needs to make AWV appointment for this year. Clinical Goal(s):  Collaboration with Susy Frizzle, MD regarding development and update of comprehensive plan of care as evidenced by provider attestation and co-signature Inter-disciplinary care team collaboration (see longitudinal plan of care) patient will work with care management team to address care coordination and chronic disease management needs related to Disease Management Educational Needs Care Coordination Medication Management and Education   Interventions:  Evaluation of current treatment plan related to Atrial Fibrillation and HTN,  self-management and patient's adherence to plan as established by provider. Collaboration with Susy Frizzle, MD regarding development and update of comprehensive plan of care as evidenced by provider attestation       and co-signature Inter-disciplinary  care team collaboration (see longitudinal plan of care) Discussed plans with patient for ongoing care management follow up and provided patient with direct contact information for care management team Mailed education to patient's home Atrial fibrillation Reviewed importance of taking medications as prescribed, importance of beta-blocker and blood thinner Gave RN care manager contact information to patient Reviewed signs/ symptoms Atrial fibrillation exacerbation, importance of keeping in touch with doctor for symptom management Encouraged pt to make his Annual Wellness visit appointment for this year Self Care Activities:  Attends all scheduled provider appointments Calls provider office for new concerns or questions Patient Goals: - begin a symptom diary - make a plan to exercise regularly - make a plan to eat healthy - take medicine as prescribed , reviewed importance of blood thinner - make your annual wellness visit appointment for this year Follow Up Plan: Telephone follow up appointment with care management team member scheduled for:  10/29/2020    Care Plan : Hypertension (Adult)  Updates made by Kassie Mends, RN since 09/17/2020 12:00 AM     Problem: Hypertension (Hypertension)   Priority: Medium     Long-Range Goal: Hypertension Monitored   Start Date: 09/17/2020  Expected End Date: 03/20/2021  This Visit's Progress: On track  Priority: Medium  Note:   Objective:  Last practice recorded BP  readings:  BP Readings from Last 3 Encounters:  07/15/20 (!) 160/70  10/30/19 130/78  10/15/18 128/78   Most recent eGFR/CrCl: No results found for: EGFR  No components found for: CRCL Current Barriers:  Knowledge Deficits related to basic understanding of hypertension pathophysiology and self care management-  needs reinforcement of low sodium diet, importance of keeping blood pressure managed for overall health, pt reports he checks BP on occasion, eats fast food " sometimes',  is part time [employer] and stays busy, gets out for church and other social activities at times, lives with spouse and he helps take care of her. Case Manager Clinical Goal(s):  patient will verbalize understanding of plan for hypertension management patient will attend all scheduled medical appointments: needs to make appointment for annual wellness visit for this year patient will demonstrate improved adherence to prescribed treatment plan for hypertension as evidenced by taking all medications as prescribed, monitoring and recording blood pressure as directed, adhering to low sodium/DASH diet Interventions:  Collaboration with Susy Frizzle, MD regarding development and update of comprehensive plan of care as evidenced by provider attestation and co-signature Inter-disciplinary care team collaboration (see longitudinal plan of care) Evaluation of current treatment plan related to hypertension self management and patient's adherence to plan as established by provider. Provided education to patient re: stroke prevention, s/s of heart attack and stroke, DASH diet, complications of uncontrolled blood pressure Reviewed medications with patient and discussed importance of compliance Discussed plans with patient for ongoing care management follow up and provided patient with direct contact information for care management team Advised patient, providing education and rationale, to monitor blood pressure 3 times week and record, calling PCP for findings outside established parameters.  Reviewed scheduled/upcoming provider appointments including: Annual Wellness Visit Provided patient with RN care manager contact number Mailed education (low sodium diet) to patient's home Self-Care Activities: Attends all scheduled provider appointments Calls provider office for new concerns, questions, or BP outside discussed parameters Follows a low sodium diet/DASH diet Patient Goals: - check blood pressure 3 times  per week - choose a place to take my blood pressure (home, clinic or office, retail store) - write blood pressure results in a log or diary  - follow low sodium diet - look over education mailed to you for low sodium diet - follow up with primary care provider as needed Follow Up Plan: Telephone follow up appointment with care management team member scheduled for:  10/29/2020     Plan:Telephone follow up appointment with care management team member scheduled for:  10/29/2020  Jacqlyn Larsen [facility], BSN RN Case Manager [facility] [phone]

## 2020-09-28 ENCOUNTER — Other Ambulatory Visit: Payer: Self-pay | Admitting: Family Medicine

## 2020-10-05 DIAGNOSIS — L814 Other melanin hyperpigmentation: Secondary | ICD-10-CM | POA: Diagnosis not present

## 2020-10-05 DIAGNOSIS — L430 Hypertrophic lichen planus: Secondary | ICD-10-CM | POA: Diagnosis not present

## 2020-10-05 DIAGNOSIS — H2513 Age-related nuclear cataract, bilateral: Secondary | ICD-10-CM | POA: Diagnosis not present

## 2020-10-05 DIAGNOSIS — L821 Other seborrheic keratosis: Secondary | ICD-10-CM | POA: Diagnosis not present

## 2020-10-05 DIAGNOSIS — L570 Actinic keratosis: Secondary | ICD-10-CM | POA: Diagnosis not present

## 2020-10-05 DIAGNOSIS — H35372 Puckering of macula, left eye: Secondary | ICD-10-CM | POA: Diagnosis not present

## 2020-10-05 DIAGNOSIS — D485 Neoplasm of uncertain behavior of skin: Secondary | ICD-10-CM | POA: Diagnosis not present

## 2020-10-05 DIAGNOSIS — H5203 Hypermetropia, bilateral: Secondary | ICD-10-CM | POA: Diagnosis not present

## 2020-10-05 DIAGNOSIS — D1801 Hemangioma of skin and subcutaneous tissue: Secondary | ICD-10-CM | POA: Diagnosis not present

## 2020-10-05 DIAGNOSIS — Z85828 Personal history of other malignant neoplasm of skin: Secondary | ICD-10-CM | POA: Diagnosis not present

## 2020-10-09 ENCOUNTER — Other Ambulatory Visit: Payer: Self-pay | Admitting: Family Medicine

## 2020-10-15 ENCOUNTER — Other Ambulatory Visit: Payer: Medicare HMO

## 2020-10-15 ENCOUNTER — Other Ambulatory Visit: Payer: Self-pay

## 2020-10-15 DIAGNOSIS — Z125 Encounter for screening for malignant neoplasm of prostate: Secondary | ICD-10-CM

## 2020-10-15 DIAGNOSIS — I10 Essential (primary) hypertension: Secondary | ICD-10-CM

## 2020-10-15 DIAGNOSIS — Z1159 Encounter for screening for other viral diseases: Secondary | ICD-10-CM | POA: Diagnosis not present

## 2020-10-15 DIAGNOSIS — E1165 Type 2 diabetes mellitus with hyperglycemia: Secondary | ICD-10-CM | POA: Diagnosis not present

## 2020-10-15 DIAGNOSIS — I480 Paroxysmal atrial fibrillation: Secondary | ICD-10-CM

## 2020-10-15 DIAGNOSIS — IMO0002 Reserved for concepts with insufficient information to code with codable children: Secondary | ICD-10-CM

## 2020-10-15 DIAGNOSIS — Z136 Encounter for screening for cardiovascular disorders: Secondary | ICD-10-CM

## 2020-10-15 DIAGNOSIS — E7800 Pure hypercholesterolemia, unspecified: Secondary | ICD-10-CM

## 2020-10-15 DIAGNOSIS — Z13220 Encounter for screening for lipoid disorders: Secondary | ICD-10-CM | POA: Diagnosis not present

## 2020-10-15 DIAGNOSIS — E118 Type 2 diabetes mellitus with unspecified complications: Secondary | ICD-10-CM | POA: Diagnosis not present

## 2020-10-18 LAB — CBC WITH DIFFERENTIAL/PLATELET
Absolute Monocytes: 719 cells/uL (ref 200–950)
Basophils Absolute: 71 cells/uL (ref 0–200)
Basophils Relative: 0.9 %
Eosinophils Absolute: 292 cells/uL (ref 15–500)
Eosinophils Relative: 3.7 %
HCT: 42.4 % (ref 38.5–50.0)
Hemoglobin: 14 g/dL (ref 13.2–17.1)
Lymphs Abs: 2481 cells/uL (ref 850–3900)
MCH: 29.5 pg (ref 27.0–33.0)
MCHC: 33 g/dL (ref 32.0–36.0)
MCV: 89.3 fL (ref 80.0–100.0)
MPV: 12 fL (ref 7.5–12.5)
Monocytes Relative: 9.1 %
Neutro Abs: 4337 cells/uL (ref 1500–7800)
Neutrophils Relative %: 54.9 %
Platelets: 216 10*3/uL (ref 140–400)
RBC: 4.75 10*6/uL (ref 4.20–5.80)
RDW: 12.3 % (ref 11.0–15.0)
Total Lymphocyte: 31.4 %
WBC: 7.9 10*3/uL (ref 3.8–10.8)

## 2020-10-18 LAB — LIPID PANEL
Cholesterol: 102 mg/dL (ref <200)
HDL: 38 mg/dL — ABNORMAL LOW (ref >=40)
LDL Cholesterol (Calc): 48 mg/dL (calc)
Non-HDL Cholesterol (Calc): 64 mg/dL (calc) (ref <130)
Total CHOL/HDL Ratio: 2.7 (calc) (ref <5.0)
Triglycerides: 81 mg/dL (ref <150)

## 2020-10-18 LAB — COMPLETE METABOLIC PANEL WITH GFR
AG Ratio: 1.6 (calc) (ref 1.0–2.5)
ALT: 19 U/L (ref 9–46)
AST: 15 U/L (ref 10–35)
Albumin: 4 g/dL (ref 3.6–5.1)
Alkaline phosphatase (APISO): 40 U/L (ref 35–144)
BUN/Creatinine Ratio: 12 (calc) (ref 6–22)
BUN: 16 mg/dL (ref 7–25)
CO2: 27 mmol/L (ref 20–32)
Calcium: 10.2 mg/dL (ref 8.6–10.3)
Chloride: 104 mmol/L (ref 98–110)
Creat: 1.35 mg/dL — ABNORMAL HIGH (ref 0.70–1.28)
Globulin: 2.5 g/dL (calc) (ref 1.9–3.7)
Glucose, Bld: 129 mg/dL — ABNORMAL HIGH (ref 65–99)
Potassium: 4.2 mmol/L (ref 3.5–5.3)
Sodium: 141 mmol/L (ref 135–146)
Total Bilirubin: 0.7 mg/dL (ref 0.2–1.2)
Total Protein: 6.5 g/dL (ref 6.1–8.1)
eGFR: 53 mL/min/{1.73_m2} — ABNORMAL LOW (ref >=60)

## 2020-10-18 LAB — MICROALBUMIN / CREATININE URINE RATIO
Creatinine, Urine: 156 mg/dL (ref 20–320)
Microalb Creat Ratio: 15 mcg/mg creat (ref <30)
Microalb, Ur: 2.3 mg/dL

## 2020-10-18 LAB — PSA: PSA: 4.31 ng/mL — ABNORMAL HIGH (ref <=4.00)

## 2020-10-18 LAB — HEMOGLOBIN A1C
Hgb A1c MFr Bld: 6.5 % of total Hgb — ABNORMAL HIGH (ref <5.7)
Mean Plasma Glucose: 140 mg/dL
eAG (mmol/L): 7.7 mmol/L

## 2020-10-18 LAB — HEPATITIS C ANTIBODY
Hepatitis C Ab: NONREACTIVE
SIGNAL TO CUT-OFF: 0.01 (ref <1.00)

## 2020-10-22 ENCOUNTER — Encounter: Payer: Self-pay | Admitting: Family Medicine

## 2020-10-22 ENCOUNTER — Other Ambulatory Visit: Payer: Self-pay

## 2020-10-22 ENCOUNTER — Ambulatory Visit (INDEPENDENT_AMBULATORY_CARE_PROVIDER_SITE_OTHER): Payer: Medicare HMO | Admitting: Family Medicine

## 2020-10-22 VITALS — BP 144/82 | HR 66 | Temp 98.1°F | Resp 14 | Ht 72.0 in | Wt 234.0 lb

## 2020-10-22 DIAGNOSIS — Z0000 Encounter for general adult medical examination without abnormal findings: Secondary | ICD-10-CM

## 2020-10-22 DIAGNOSIS — E118 Type 2 diabetes mellitus with unspecified complications: Secondary | ICD-10-CM | POA: Diagnosis not present

## 2020-10-22 DIAGNOSIS — I10 Essential (primary) hypertension: Secondary | ICD-10-CM

## 2020-10-22 DIAGNOSIS — Z0001 Encounter for general adult medical examination with abnormal findings: Secondary | ICD-10-CM | POA: Diagnosis not present

## 2020-10-22 DIAGNOSIS — I480 Paroxysmal atrial fibrillation: Secondary | ICD-10-CM | POA: Diagnosis not present

## 2020-10-22 DIAGNOSIS — E1165 Type 2 diabetes mellitus with hyperglycemia: Secondary | ICD-10-CM

## 2020-10-22 DIAGNOSIS — E7800 Pure hypercholesterolemia, unspecified: Secondary | ICD-10-CM | POA: Diagnosis not present

## 2020-10-22 DIAGNOSIS — I2510 Atherosclerotic heart disease of native coronary artery without angina pectoris: Secondary | ICD-10-CM

## 2020-10-22 DIAGNOSIS — IMO0002 Reserved for concepts with insufficient information to code with codable children: Secondary | ICD-10-CM

## 2020-10-22 MED ORDER — AMOXICILLIN 500 MG PO CAPS: ORAL_CAPSULE | ORAL | 4 refills | Status: DC
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
## 2020-10-29 ENCOUNTER — Ambulatory Visit (INDEPENDENT_AMBULATORY_CARE_PROVIDER_SITE_OTHER): Payer: Medicare HMO | Admitting: *Deleted

## 2020-10-29 ENCOUNTER — Telehealth: Payer: Medicare HMO

## 2020-10-29 DIAGNOSIS — I10 Essential (primary) hypertension: Secondary | ICD-10-CM

## 2020-10-29 DIAGNOSIS — I480 Paroxysmal atrial fibrillation: Secondary | ICD-10-CM

## 2020-10-29 NOTE — Patient Instructions (Signed)
Visit Information  PATIENT GOALS:  Goals Addressed             This Visit's Progress    Track and Manage Heart Rate and Rhythm-Atrial Fibrillation       Timeframe:  Long-Range Goal Priority:  Medium Start Date:        09/17/2020                     Expected End Date:       03/20/2021                Follow Up Date - 12/06/2020   - begin a symptom diary - try to get outside every day - continue working in your yard, part time farming work - eat heart healthy diet, avoid saturated/ trans fats - take medicine as prescribed , reviewed importance of blood thinner - follow up with your doctor as needed   Why is this important?   Atrial fibrillation may have no symptoms. Sometimes the symptoms get worse or happen more often.  It is important to keep track of what your symptoms are and when they happen.  A change in symptoms is important to discuss with your doctor or nurse.  Being active and healthy eating will also help you manage your heart condition.     Notes:      Track and Manage My Blood Pressure-Hypertension       Timeframe:  Long-Range Goal Priority:  Medium Start Date:       09/17/2020                      Expected End Date:      03/20/2021                 Follow Up Date - 12/06/2020   - check blood pressure 3 times per week and record in log or diary - follow low sodium diet- avoid salty snacks and fast food - take all medications as prescribed - follow up with primary care provider as needed   Why is this important?   You won't feel high blood pressure, but it can still hurt your blood vessels.  High blood pressure can cause heart or kidney problems. It can also cause a stroke.  Making lifestyle changes like losing a little weight or eating less salt will help.  Checking your blood pressure at home and at different times of the day can help to control blood pressure.  If the doctor prescribes medicine remember to take it the way the doctor ordered.  Call the office  if you cannot afford the medicine or if there are questions about it.     Notes:         The patient verbalized understanding of instructions, educational materials, and care plan provided today and declined offer to receive copy of patient instructions, educational materials, and care plan.   Telephone follow up appointment with care management team member scheduled for:  12/06/2020  Jacqlyn Larsen [facility], BSN RN Case Manager [facility] [phone]

## 2020-10-29 NOTE — Chronic Care Management (AMB) (Signed)
Chronic Care Management   CCM RN Visit Note  10/29/2020 Name: BERWYN BIGLEY MRN: [id number] DOB: 11/22/1940  Subjective: Henry Martin is a 80 y.o. year old male who is a primary care patient of Pickard, Cammie Mcgee, MD. The care management team was consulted for assistance with disease management and care coordination needs.    Engaged with patient by telephone for follow up visit in response to provider referral for case management and/or care coordination services.   Consent to Services:  The patient was given information about Chronic Care Management services, agreed to services, and gave verbal consent prior to initiation of services.  Please see initial visit note for detailed documentation.   Patient agreed to services and verbal consent obtained.   Assessment: Review of patient past medical history, allergies, medications, health status, including review of consultants reports, laboratory and other test data, was performed as part of comprehensive evaluation and provision of chronic care management services.   SDOH (Social Determinants of Health) assessments and interventions performed:    CCM Care Plan  Allergies  Allergen Reactions   Contrast Media [Iodinated Diagnostic Agents] Other (See Comments)    "Isovue"; "blisters; all down my legs"   Metrizamide Other (See Comments)    "Isovue"; "blisters; all down my legs"    Outpatient Encounter Medications as of 10/29/2020  Medication Sig   atorvastatin (LIPITOR) 10 MG tablet TAKE 1 TABLET DAILY   Blood Glucose Monitoring Suppl (ACCU-CHEK AVIVA PLUS) w/Device KIT Use to check BS QAM   Cholecalciferol (VITAMIN D3) 1000 UNITS CAPS Take 1,000 Units by mouth daily.    Fish Oil OIL Take 800 mg by mouth 2 (two) times daily.    glucose blood (ACCU-CHEK AVIVA PLUS) test strip Check BC QAM DX: e11.9   hydrochlorothiazide (HYDRODIURIL) 25 MG tablet TAKE 1 TABLET BY MOUTH ONCE A DAY   Lancets (ACCU-CHEK MULTICLIX) lancets Check BC QAM  DX: e11.9   Lancets Misc. (ACCU-CHEK MULTICLIX LANCET DEV) KIT Check BC QAM DX: e11.9   metFORMIN (GLUCOPHAGE) 500 MG tablet TAKE 2 TABLETS BY MOUTH  DAILY WITH A MEAL   metoprolol succinate (TOPROL-XL) 25 MG 24 hr tablet TAKE 1/2 TABLET (12.5MG) ATBEDTIME   olmesartan (BENICAR) 40 MG tablet TAKE 1 TABLET DAILY   XARELTO 20 MG TABS tablet TAKE 1 TABLET BY MOUTH ONCE DAILY   amoxicillin (AMOXIL) 500 MG capsule TAKE 4 CAPSULES BY MOUTH ONCE FOR 1 DOSE PRIOR TO DENTAL WORK (Patient not taking: Reported on 10/29/2020)   No facility-administered encounter medications on file as of 10/29/2020.    Patient Active Problem List   Diagnosis Date Noted   OA (osteoarthritis) of knee 11/14/2017   Chronic anticoagulation 02/01/2016   History of stroke 02/01/2016   Sleep apnea 02/01/2016   Personal history of colonic polyps 12/26/2012   Diarrhea 07/05/2011   PAF (paroxysmal atrial fibrillation) ([place]) 07/04/2011   HLD (hyperlipidemia) 03/23/2008   Essential hypertension 03/23/2008   CAD (coronary artery disease) 03/23/2008    Conditions to be addressed/monitored:Atrial Fibrillation and HTN  Care Plan : Atrial Fibrillation (Adult)  Updates made by Kassie Mends, RN since 10/29/2020 12:00 AM     Problem: Dysrhythmia (Atrial Fibrillation)   Priority: Medium     Long-Range Goal: Heart Rate and Rhythm Monitored and Managed   Start Date: 09/17/2020  Expected End Date: 03/20/2021  This Visit's Progress: On track  Recent Progress: On track  Priority: Medium  Note:   Current Barriers:  Ineffective Self Health Maintenance  in a patient with Atrial Fibrillation- can benefit from reinforcement on Atrial Fibrillation action plan, pt lives with spouse (has dementia) and has assistance in the home for her, daughter will be moving to assist with her mother.  Pt feels he is managing " pretty well" with health conditions at present, has medications and taking as prescribed, states saw primary care provider last  week. Clinical Goal(s):  Collaboration with Susy Frizzle, MD regarding development and update of comprehensive plan of care as evidenced by provider attestation and co-signature Inter-disciplinary care team collaboration (see longitudinal plan of care) patient will work with care management team to address care coordination and chronic disease management needs related to Disease Management Educational Needs Care Coordination Medication Management and Education   Interventions:  Evaluation of current treatment plan related to Atrial Fibrillation and HTN,  self-management and patient's adherence to plan as established by provider. Collaboration with Susy Frizzle, MD regarding development and update of comprehensive plan of care as evidenced by provider attestation       and co-signature Inter-disciplinary care team collaboration (see longitudinal plan of care) Reviewed plans with patient for ongoing care management follow up and provided patient with direct contact information for care management team Reviewed importance of taking medications as prescribed, importance of beta-blocker and blood thinner Reinforced RN care manager contact information to patient Reinforced signs/ symptoms Atrial fibrillation exacerbation, importance of keeping in touch with doctor for symptom management Encouraged pt to continue getting outside daily and continue working in the yard, etc. Self Care Activities:  Attends all scheduled provider appointments Calls provider office for new concerns or questions Patient Goals: - begin a symptom diary - try to get outside every day - continue working in your yard, part time farming work - eat heart healthy diet, avoid saturated/ trans fats - take medicine as prescribed , reviewed importance of blood thinner - follow up with your doctor as needed Follow Up Plan: Telephone follow up appointment with care management team member scheduled for:  12/06/2020     Care Plan : Hypertension (Adult)  Updates made by Kassie Mends, RN since 10/29/2020 12:00 AM     Problem: Hypertension (Hypertension)   Priority: Medium     Long-Range Goal: Hypertension Monitored   Start Date: 09/17/2020  Expected End Date: 03/20/2021  This Visit's Progress: On track  Recent Progress: On track  Priority: Medium  Note:   Objective:  Last practice recorded BP readings:  BP Readings from Last 3 Encounters:  07/15/20 (!) 160/70  10/30/19 130/78  10/15/18 128/78   Most recent eGFR/CrCl: No results found for: EGFR  No components found for: CRCL Current Barriers:  Knowledge Deficits related to basic understanding of hypertension pathophysiology and self care management-  needs reinforcement of low sodium diet, importance of keeping blood pressure managed for overall health, pt reports he checks BP at least 2 x per week, eats fast food " sometimes', is part time [employer] and stays busy, gets out for church and other social activities at times, lives with spouse and he helps take care of her. Case Manager Clinical Goal(s):  patient will verbalize understanding of plan for hypertension management patient will attend all scheduled medical appointments: needs to make appointment for annual wellness visit for this year patient will demonstrate improved adherence to prescribed treatment plan for hypertension as evidenced by taking all medications as prescribed, monitoring and recording blood pressure as directed, adhering to low sodium/DASH diet Interventions:  Collaboration with Jenna Luo  T, MD regarding development and update of comprehensive plan of care as evidenced by provider attestation and co-signature Inter-disciplinary care team collaboration (see longitudinal plan of care) Evaluation of current treatment plan related to hypertension self management and patient's adherence to plan as established by provider. Reinforced education to patient re: stroke prevention,  s/s of heart attack and stroke, DASH diet, complications of uncontrolled blood pressure Reviewed medications with patient and discussed importance of compliance Reviewed plans with patient for ongoing care management follow up and provided patient with direct contact information for care management team Reinforced with patient, providing education and rationale, to monitor blood pressure 3 times week and record, calling PCP for findings outside established parameters.  Reinforced patient with RN care manager contact number Encouraged pt to continue getting outside daily Self-Care Activities: Attends all scheduled provider appointments Calls provider office for new concerns, questions, or BP outside discussed parameters Follows a low sodium diet/DASH diet Patient Goals: - check blood pressure 3 times per week and record in log or diary - follow low sodium diet- avoid salty snacks and fast food - take all medications as prescribed - follow up with primary care provider as needed Follow Up Plan: Telephone follow up appointment with care management team member scheduled for:  12/06/2020     Plan:Telephone follow up appointment with care management team member scheduled for:  12/06/2020  Jacqlyn Larsen [facility], BSN RN Case Manager [facility] [phone]

## 2020-11-24 DIAGNOSIS — I10 Essential (primary) hypertension: Secondary | ICD-10-CM | POA: Diagnosis not present

## 2020-11-24 DIAGNOSIS — Z008 Encounter for other general examination: Secondary | ICD-10-CM | POA: Diagnosis not present

## 2020-11-24 DIAGNOSIS — N529 Male erectile dysfunction, unspecified: Secondary | ICD-10-CM | POA: Diagnosis not present

## 2020-11-24 DIAGNOSIS — I4892 Unspecified atrial flutter: Secondary | ICD-10-CM | POA: Diagnosis not present

## 2020-11-24 DIAGNOSIS — I4891 Unspecified atrial fibrillation: Secondary | ICD-10-CM | POA: Diagnosis not present

## 2020-11-24 DIAGNOSIS — M1990 Unspecified osteoarthritis, unspecified site: Secondary | ICD-10-CM | POA: Diagnosis not present

## 2020-11-24 DIAGNOSIS — I2510 Atherosclerotic heart disease of native coronary artery without angina pectoris: Secondary | ICD-10-CM | POA: Diagnosis not present

## 2020-11-24 DIAGNOSIS — E669 Obesity, unspecified: Secondary | ICD-10-CM | POA: Diagnosis not present

## 2020-11-24 DIAGNOSIS — E785 Hyperlipidemia, unspecified: Secondary | ICD-10-CM | POA: Diagnosis not present

## 2020-11-24 DIAGNOSIS — E1136 Type 2 diabetes mellitus with diabetic cataract: Secondary | ICD-10-CM | POA: Diagnosis not present

## 2020-11-24 DIAGNOSIS — D6869 Other thrombophilia: Secondary | ICD-10-CM | POA: Diagnosis not present

## 2020-11-29 ENCOUNTER — Other Ambulatory Visit: Payer: Self-pay | Admitting: Family Medicine

## 2020-12-06 ENCOUNTER — Ambulatory Visit (INDEPENDENT_AMBULATORY_CARE_PROVIDER_SITE_OTHER): Payer: Medicare HMO | Admitting: *Deleted

## 2020-12-06 DIAGNOSIS — I10 Essential (primary) hypertension: Secondary | ICD-10-CM

## 2020-12-06 DIAGNOSIS — I480 Paroxysmal atrial fibrillation: Secondary | ICD-10-CM

## 2020-12-06 NOTE — Patient Instructions (Signed)
Visit Information  PATIENT GOALS:  Goals Addressed             This Visit's Progress    Track and Manage Heart Rate and Rhythm-Atrial Fibrillation       Timeframe:  Long-Range Goal Priority:  Medium Start Date:        09/17/2020                     Expected End Date:       03/20/2021                Follow Up Date - 02/21/2021   - take blood thinner xarelto as prescribed - avoid activities that can lead to bruising, cuts or injuries - use a soft toothbrush and electric razor - try to get outside every day - continue working in your yard, part time farming work - eat heart healthy diet, avoid saturated/ trans fats - take medicine as prescribed  - follow up with your doctor as needed   Why is this important?   Atrial fibrillation may have no symptoms. Sometimes the symptoms get worse or happen more often.  It is important to keep track of what your symptoms are and when they happen.  A change in symptoms is important to discuss with your doctor or nurse.  Being active and healthy eating will also help you manage your heart condition.     Notes:      Track and Manage My Blood Pressure-Hypertension       Timeframe:  Long-Range Goal Priority:  Medium Start Date:       09/17/2020                      Expected End Date:      03/20/2021                 Follow Up Date - 02/21/2021   - check blood pressure 3 times per week and record in log or diary - call your doctor if systolic (top number) is consistently over 376 and if diastolic (bottom number) is consistently over 90 - follow low sodium diet- avoid salty snacks and fast food - take all medications as prescribed - follow up with primary care provider as needed - schedule flu vaccine - practice good handwashing   Why is this important?   You won't feel high blood pressure, but it can still hurt your blood vessels.  High blood pressure can cause heart or kidney problems. It can also cause a stroke.  Making lifestyle changes  like losing a little weight or eating less salt will help.  Checking your blood pressure at home and at different times of the day can help to control blood pressure.  If the doctor prescribes medicine remember to take it the way the doctor ordered.  Call the office if you cannot afford the medicine or if there are questions about it.     Notes:         The patient verbalized understanding of instructions, educational materials, and care plan provided today and declined offer to receive copy of patient instructions, educational materials, and care plan.   Telephone follow up appointment with care management team member scheduled for:   02/21/2021  Jacqlyn Larsen [facility], BSN RN Case Manager [facility] [phone]

## 2020-12-06 NOTE — Chronic Care Management (AMB) (Signed)
Chronic Care Management   CCM RN Visit Note  12/06/2020 Name: Henry Martin MRN: [id number] DOB: Sep 12, 1940  Subjective: Henry Martin is a 80 y.o. year old male who is a primary care patient of Pickard, Cammie Mcgee, MD. The care management team was consulted for assistance with disease management and care coordination needs.    Engaged with patient by telephone for follow up visit in response to provider referral for case management and/or care coordination services.   Consent to Services:  The patient was given information about Chronic Care Management services, agreed to services, and gave verbal consent prior to initiation of services.  Please see initial visit note for detailed documentation.   Patient agreed to services and verbal consent obtained.   Assessment: Review of patient past medical history, allergies, medications, health status, including review of consultants reports, laboratory and other test data, was performed as part of comprehensive evaluation and provision of chronic care management services.   SDOH (Social Determinants of Health) assessments and interventions performed:    CCM Care Plan  Allergies  Allergen Reactions   Contrast Media [Iodinated Diagnostic Agents] Other (See Comments)    "Isovue"; "blisters; all down my legs"   Metrizamide Other (See Comments)    "Isovue"; "blisters; all down my legs"    Outpatient Encounter Medications as of 12/06/2020  Medication Sig   atorvastatin (LIPITOR) 10 MG tablet TAKE 1 TABLET DAILY   Blood Glucose Monitoring Suppl (ACCU-CHEK AVIVA PLUS) w/Device KIT Use to check BS QAM   Cholecalciferol (VITAMIN D3) 1000 UNITS CAPS Take 1,000 Units by mouth daily.    Fish Oil OIL Take 800 mg by mouth 2 (two) times daily.    glucose blood (ACCU-CHEK AVIVA PLUS) test strip Check BC QAM DX: e11.9   hydrochlorothiazide (HYDRODIURIL) 25 MG tablet TAKE 1 TABLET BY MOUTH ONCE A DAY   Lancets (ACCU-CHEK MULTICLIX) lancets Check BC QAM  DX: e11.9   Lancets Misc. (ACCU-CHEK MULTICLIX LANCET DEV) KIT Check BC QAM DX: e11.9   metFORMIN (GLUCOPHAGE) 500 MG tablet TAKE 2 TABLETS BY MOUTH  DAILY WITH A MEAL   metoprolol succinate (TOPROL-XL) 25 MG 24 hr tablet TAKE 1/2 TABLET (12.5MG) ATBEDTIME   olmesartan (BENICAR) 40 MG tablet TAKE 1 TABLET DAILY   XARELTO 20 MG TABS tablet TAKE 1 TABLET BY MOUTH ONCE DAILY   amoxicillin (AMOXIL) 500 MG capsule TAKE 4 CAPSULES BY MOUTH ONCE FOR 1 DOSE PRIOR TO DENTAL WORK (Patient not taking: No sig reported)   No facility-administered encounter medications on file as of 12/06/2020.    Patient Active Problem List   Diagnosis Date Noted   OA (osteoarthritis) of knee 11/14/2017   Chronic anticoagulation 02/01/2016   History of stroke 02/01/2016   Sleep apnea 02/01/2016   Personal history of colonic polyps 12/26/2012   Diarrhea 07/05/2011   PAF (paroxysmal atrial fibrillation) ([place]) 07/04/2011   HLD (hyperlipidemia) 03/23/2008   Essential hypertension 03/23/2008   CAD (coronary artery disease) 03/23/2008    Conditions to be addressed/monitored:Atrial Fibrillation and HTN  Care Plan : Atrial Fibrillation (Adult)  Updates made by Kassie Mends, RN since 12/06/2020 12:00 AM     Problem: Dysrhythmia (Atrial Fibrillation)   Priority: Medium     Long-Range Goal: Heart Rate and Rhythm Monitored and Managed   Start Date: 09/17/2020  Expected End Date: 03/20/2021  This Visit's Progress: On track  Recent Progress: On track  Priority: Medium  Note:   Current Barriers:  Ineffective Self Health Maintenance  in a patient with Atrial Fibrillation- can benefit from reinforcement on Atrial Fibrillation action plan, pt lives with spouse (has dementia) and has assistance in the home for her, daughter has moved here to assist with her mother.  Pt feels he is managing " pretty well" with health conditions at present, has medications and taking as prescribed, continues xarelto. Clinical Goal(s):   Collaboration with Susy Frizzle, MD regarding development and update of comprehensive plan of care as evidenced by provider attestation and co-signature Inter-disciplinary care team collaboration (see longitudinal plan of care) patient will work with care management team to address care coordination and chronic disease management needs related to Disease Management Educational Needs Care Coordination Medication Management and Education   Interventions:  Evaluation of current treatment plan related to Atrial Fibrillation and HTN,  self-management and patient's adherence to plan as established by provider. Collaboration with Susy Frizzle, MD regarding development and update of comprehensive plan of care as evidenced by provider attestation       and co-signature Inter-disciplinary care team collaboration (see longitudinal plan of care) Reinforced plans with patient for ongoing care management follow up and direct contact information for care management team Reviewed importance of taking medications as prescribed, importance of beta-blocker and blood thinner Reviewed signs/ symptoms Atrial fibrillation exacerbation, importance of keeping in touch with doctor for symptom management Encouraged pt to continue getting outside daily and continue working in the yard, etc. Reviewed importance of taking all medications as prescribed Self Care Activities:  Attends all scheduled provider appointments Calls provider office for new concerns or questions Patient Goals: - take blood thinner xarelto as prescribed - avoid activities that can lead to bruising, cuts or injuries - use a soft toothbrush and electric razor - try to get outside every day - continue working in your yard, part time farming work - eat heart healthy diet, avoid saturated/ trans fats - take medicine as prescribed  - follow up with your doctor as needed Follow Up Plan: Telephone follow up appointment with care management  team member scheduled for:  02/21/2021    Care Plan : Hypertension (Adult)  Updates made by Kassie Mends, RN since 12/06/2020 12:00 AM     Problem: Hypertension (Hypertension)   Priority: Medium     Long-Range Goal: Hypertension Monitored   Start Date: 09/17/2020  Expected End Date: 03/20/2021  This Visit's Progress: Not on track  Recent Progress: On track  Priority: Medium  Note:   Objective:  Last practice recorded BP readings:  BP Readings from Last 3 Encounters:  07/15/20 (!) 160/70  10/30/19 130/78  10/15/18 128/78   Most recent eGFR/CrCl: No results found for: EGFR  No components found for: CRCL Current Barriers:  Knowledge Deficits related to basic understanding of hypertension pathophysiology and self care management-  needs reinforcement of low sodium diet, importance of keeping blood pressure managed for overall health, pt reports he checks BP 3-4 x month with recent readings 135/65, 147/71. Eats fast food " sometimes', is part time [employer] and stays busy, gets out for church and other social activities at times, lives with spouse and he helps take care of her, daughter from [place] has moved here to help take care of patient's spouse.  Reports does not check CBG and has not been instructed to do so.  Most recent AIC per pt 6.0. Case Manager Clinical Goal(s):  patient will verbalize understanding of plan for hypertension management patient will attend all scheduled medical appointments: needs to make  appointment for annual wellness visit for this year patient will demonstrate improved adherence to prescribed treatment plan for hypertension as evidenced by taking all medications as prescribed, monitoring and recording blood pressure as directed, adhering to low sodium/DASH diet Interventions:  Collaboration with Susy Frizzle, MD regarding development and update of comprehensive plan of care as evidenced by provider attestation and co-signature Inter-disciplinary care team  collaboration (see longitudinal plan of care) Evaluation of current treatment plan related to hypertension self management and patient's adherence to plan as established by provider. Reviewed education to patient re: stroke prevention, s/s of heart attack and stroke, DASH diet, complications of uncontrolled blood pressure Reviewed medications with patient and discussed importance of compliance Reinforced plans with patient for ongoing care management follow up and provided patient with direct contact information for care management team Reviewed with patient, providing education and rationale, to monitor blood pressure 3 times week and record, calling PCP for findings outside established parameters.  Reinforced with patient RN care manager contact number Reviewed importance of exercising- continue getting outside daily Encouraged pt to schedule appointment for flu vaccine Reviewed importance of good handwashing and wearing a mask if needed Self-Care Activities: Attends all scheduled provider appointments Calls provider office for new concerns, questions, or BP outside discussed parameters Follows a low sodium diet/DASH diet Patient Goals: - check blood pressure 3 times per week and record in log or diary - call your doctor if systolic (top number) is consistently over 226 and if diastolic (bottom number) is consistently over 90 - follow low sodium diet- avoid salty snacks and fast food - take all medications as prescribed - follow up with primary care provider as needed - schedule flu vaccine - practice good handwashing Follow Up Plan: Telephone follow up appointment with care management team member scheduled for:  02/21/2021     Plan:Telephone follow up appointment with care management team member scheduled for: 02/21/2021  Jacqlyn Larsen [facility], BSN RN Case Manager [facility] [phone]

## 2020-12-23 DIAGNOSIS — H25811 Combined forms of age-related cataract, right eye: Secondary | ICD-10-CM | POA: Diagnosis not present

## 2020-12-23 DIAGNOSIS — H2511 Age-related nuclear cataract, right eye: Secondary | ICD-10-CM | POA: Diagnosis not present

## 2021-01-03 DIAGNOSIS — I10 Essential (primary) hypertension: Secondary | ICD-10-CM | POA: Diagnosis not present

## 2021-01-03 DIAGNOSIS — I480 Paroxysmal atrial fibrillation: Secondary | ICD-10-CM | POA: Diagnosis not present

## 2021-02-03 DIAGNOSIS — H2512 Age-related nuclear cataract, left eye: Secondary | ICD-10-CM | POA: Diagnosis not present

## 2021-02-03 DIAGNOSIS — H25812 Combined forms of age-related cataract, left eye: Secondary | ICD-10-CM | POA: Diagnosis not present

## 2021-02-21 ENCOUNTER — Telehealth: Payer: Self-pay | Admitting: *Deleted

## 2021-02-21 ENCOUNTER — Telehealth: Payer: Medicare HMO

## 2021-02-21 NOTE — Telephone Encounter (Signed)
°  Care Management   Follow Up Note   02/21/2021 Name: Henry Martin MRN: [id number] DOB: May 08, 1940   Referred by: Susy Frizzle, MD Reason for referral : Chronic Care Management (HTN, PAF)   An unsuccessful telephone outreach was attempted today. The patient was referred to the case management team for assistance with care management and care coordination.   Follow Up Plan: Telephone follow up appointment with care management team member scheduled for: upon care guide rescheduling.  Jacqlyn Larsen RNC, BSN RN Case Manager [facility] [phone]

## 2021-03-07 ENCOUNTER — Other Ambulatory Visit: Payer: Self-pay | Admitting: Family Medicine

## 2021-03-11 ENCOUNTER — Telehealth: Payer: Self-pay | Admitting: *Deleted

## 2021-03-11 NOTE — Chronic Care Management (AMB) (Signed)
°  Care Management   Note  03/11/2021 Name: CARI VANDEBERG MRN: [id number] DOB: [birth year]  KLINTON CANDELAS is a 81 y.o. year old male who is a primary care patient of Susy Frizzle, MD and is actively engaged with the care management team. I reached out to Kirstie Mirza by phone today to assist with re-scheduling a follow up visit with the RN Case Manager  Follow up plan: Unsuccessful telephone outreach attempt made. The care management team will reach out to the patient again over the next 7 days. If patient returns call to provider office, please advise to call [facility] at [phone].  [facility]  Direct Dial: [phone]

## 2021-03-15 ENCOUNTER — Other Ambulatory Visit: Payer: Self-pay | Admitting: Family Medicine

## 2021-03-18 NOTE — Chronic Care Management (AMB) (Signed)
°  Care Management   Note  03/18/2021 Name: Henry Martin MRN: [id number] DOB: Aug 14, 1940  Henry Martin is a 81 y.o. year old male who is a primary care patient of Susy Frizzle, MD and is actively engaged with the care management team. I reached out to Kirstie Mirza by phone today to assist with re-scheduling a follow up visit with the RN Case Manager  Follow up plan: Telephone appointment with care management team member scheduled for:03/25/21  [facility]  Direct Dial: [phone]

## 2021-03-21 ENCOUNTER — Other Ambulatory Visit: Payer: Self-pay | Admitting: Family Medicine

## 2021-03-23 ENCOUNTER — Other Ambulatory Visit: Payer: Self-pay | Admitting: Family Medicine

## 2021-03-25 ENCOUNTER — Ambulatory Visit (INDEPENDENT_AMBULATORY_CARE_PROVIDER_SITE_OTHER): Payer: Medicare HMO | Admitting: *Deleted

## 2021-03-25 DIAGNOSIS — I480 Paroxysmal atrial fibrillation: Secondary | ICD-10-CM

## 2021-03-25 DIAGNOSIS — I10 Essential (primary) hypertension: Secondary | ICD-10-CM

## 2021-03-25 NOTE — Chronic Care Management (AMB) (Signed)
Chronic Care Management   CCM RN Visit Note  03/25/2021 Name: Henry Martin MRN: [id number] DOB: [birth year]  Subjective: Henry Martin is a 81 y.o. year old male who is a primary care patient of Pickard, Cammie Mcgee, MD. The care management team was consulted for assistance with disease management and care coordination needs.    Engaged with patient by telephone for follow up visit in response to provider referral for case management and/or care coordination services.   Consent to Services:  The patient was given information about Chronic Care Management services, agreed to services, and gave verbal consent prior to initiation of services.  Please see initial visit note for detailed documentation.   Patient agreed to services and verbal consent obtained.   Assessment: Review of patient past medical history, allergies, medications, health status, including review of consultants reports, laboratory and other test data, was performed as part of comprehensive evaluation and provision of chronic care management services.   SDOH (Social Determinants of Health) assessments and interventions performed:    CCM Care Plan  Allergies  Allergen Reactions   Contrast Media [Iodinated Contrast Media] Other (See Comments)    "Isovue"; "blisters; all down my legs"   Metrizamide Other (See Comments)    "Isovue"; "blisters; all down my legs"    Outpatient Encounter Medications as of 03/25/2021  Medication Sig   atorvastatin (LIPITOR) 10 MG tablet TAKE 1 TABLET DAILY   Blood Glucose Monitoring Suppl (ACCU-CHEK AVIVA PLUS) w/Device KIT Use to check BS QAM   Cholecalciferol (VITAMIN D3) 1000 UNITS CAPS Take 1,000 Units by mouth daily.    Fish Oil OIL Take 800 mg by mouth 2 (two) times daily.    glucose blood (ACCU-CHEK AVIVA PLUS) test strip Check BC QAM DX: e11.9   hydrochlorothiazide (HYDRODIURIL) 25 MG tablet TAKE 1 TABLET BY MOUTH ONCE A DAY   Lancets (ACCU-CHEK MULTICLIX) lancets Check BC QAM DX:  e11.9   Lancets Misc. (ACCU-CHEK MULTICLIX LANCET DEV) KIT Check BC QAM DX: e11.9   metFORMIN (GLUCOPHAGE) 500 MG tablet TAKE 2 TABLETS BY MOUTH  DAILY WITH A MEAL   metoprolol succinate (TOPROL-XL) 25 MG 24 hr tablet TAKE 1/2 TABLET AT BEDTIME   olmesartan (BENICAR) 40 MG tablet TAKE 1 TABLET DAILY.   XARELTO 20 MG TABS tablet TAKE 1 TABLET BY MOUTH ONCE DAILY   amoxicillin (AMOXIL) 500 MG capsule TAKE 4 CAPSULES BY MOUTH ONCE FOR 1 DOSE PRIOR TO DENTAL WORK (Patient not taking: Reported on 10/29/2020)   No facility-administered encounter medications on file as of 03/25/2021.    Patient Active Problem List   Diagnosis Date Noted   OA (osteoarthritis) of knee 11/14/2017   Chronic anticoagulation 02/01/2016   History of stroke 02/01/2016   Sleep apnea 02/01/2016   Personal history of colonic polyps 12/26/2012   Diarrhea 07/05/2011   PAF (paroxysmal atrial fibrillation) ([place]) 07/04/2011   HLD (hyperlipidemia) 03/23/2008   Essential hypertension 03/23/2008   CAD (coronary artery disease) 03/23/2008    Conditions to be addressed/monitored:Atrial Fibrillation and HTN  Care Plan : RN Care Manager Plan of Care  Updates made by Kassie Mends, RN since 03/25/2021 12:00 AM     Problem: No plan of care established for management of chronic disease states (HTN, Atrial fibrillation)   Priority: High     Long-Range Goal: Development of plan of care for chronic disease management (HTN, Atrial Fibrillation)   Start Date: 03/25/2021  Expected End Date: 09/21/2021  Priority: High  Note:  Current Barriers:  Knowledge Deficits related to plan of care for management of Atrial Fibrillation and HTN  Ineffective Self Health Maintenance in a patient with Atrial Fibrillation- can benefit from reinforcement on Atrial Fibrillation action plan, pt lives with spouse (has dementia and is now on hospice at home)  has assistance in the home for her, daughter has moved here to assist with her mother, pt is  very busy caring for his wife.  Pt feels he is managing " pretty well" with health conditions at present, has medications and taking as prescribed, continues xarelto. Knowledge Deficits related to basic understanding of hypertension pathophysiology and self care management-  needs reinforcement of low sodium diet, importance of keeping blood pressure managed for overall health, pt reports he checks BP 3-4 x month. Eats fast food " sometimes', is part time [employer] and stays busy, gets out for church and other social activities at times, lives with spouse and he helps take care of her, daughter from [place] has moved here to help take care of patient's spouse.  Reports does not check CBG and has not been instructed to do so.  Most recent AIC per pt 6.0.  RNCM Clinical Goal(s):  Patient will verbalize understanding of plan for management of Atrial Fibrillation and HTN as evidenced by patient report, review of EHR and  through collaboration with RN Care manager, provider, and care team.   Interventions: 1:1 collaboration with primary care provider regarding development and update of comprehensive plan of care as evidenced by provider attestation and co-signature Inter-disciplinary care team collaboration (see longitudinal plan of care) Evaluation of current treatment plan related to  self management and patient's adherence to plan as established by provider   AFIB Interventions: (Status:  Goal on track:  Yes.) Long Term Goal   Counseled on increased risk of stroke due to Afib and benefits of anticoagulation for stroke prevention Reviewed importance of adherence to anticoagulant exactly as prescribed Counseled on importance of regular laboratory monitoring as prescribed Afib action plan reviewed Reviewed importance of staying active Encouraged adequate rest and relaxation to avoid caregiver burnout  Hypertension Interventions:  (Status:  Goal on track:  NO.) Long Term Goal Last practice recorded BP  readings:  BP Readings from Last 3 Encounters:  10/22/20 (!) 144/82  07/15/20 (!) 160/70  10/30/19 130/78  Most recent eGFR/CrCl:  Lab Results  Component Value Date   EGFR 53 (L) 10/15/2020    No components found for: CRCL  Evaluation of current treatment plan related to hypertension self management and patient's adherence to plan as established by provider Reviewed medications with patient and discussed importance of compliance Advised patient, providing education and rationale, to monitor blood pressure daily and record, calling PCP for findings outside established parameters Discussed complications of poorly controlled blood pressure such as heart disease, stroke, circulatory complications, vision complications, kidney impairment, sexual dysfunction  Reinforced low sodium diet  Patient Goals/Self-Care Activities: Take medications as prescribed   Attend all scheduled provider appointments Perform all self care activities independently  Perform IADL's (shopping, preparing meals, housekeeping, managing finances) independently Call provider office for new concerns or questions  - check pulse (heart) rate once a day - make a plan to exercise regularly - make a plan to eat healthy - keep all lab appointments - take medicine as prescribed check blood pressure weekly choose a place to take my blood pressure (home, clinic or office, retail store) write blood pressure results in a log or diary keep a blood pressure  log take blood pressure log to all doctor appointments call doctor for signs and symptoms of high blood pressure eat more whole grains, fruits and vegetables, lean meats and healthy fats Take care of yourself, get plenty of rest, try to relax when you can Continue getting outdoors daily if possible       Plan:Telephone follow up appointment with care management team member scheduled for:  06/03/2021  Jacqlyn Larsen [facility], BSN RN Case Manager [place]  [facility] [phone]

## 2021-03-25 NOTE — Patient Instructions (Signed)
Visit Information  Thank you for taking time to visit with me today. Please don't hesitate to contact me if I can be of assistance to you before our next scheduled telephone appointment.  Following are the goals we discussed today:  Take medications as prescribed   Attend all scheduled provider appointments Perform all self care activities independently  Perform IADL's (shopping, preparing meals, housekeeping, managing finances) independently Call provider office for new concerns or questions  - check pulse (heart) rate once a day - make a plan to exercise regularly - make a plan to eat healthy - keep all lab appointments - take medicine as prescribed check blood pressure weekly choose a place to take my blood pressure (home, clinic or office, retail store) write blood pressure results in a log or diary keep a blood pressure log take blood pressure log to all doctor appointments call doctor for signs and symptoms of high blood pressure eat more whole grains, fruits and vegetables, lean meats and healthy fats Take care of yourself, get plenty of rest, try to relax when you can Continue getting outdoors daily if possible  Our next appointment is by telephone on 06/03/2021 at 3 pm  Please call the care guide team at [phone] if you need to cancel or reschedule your appointment.   If you are experiencing a Mental Health or [place] or need someone to talk to, please call the [place] National Suicide Prevention Lifeline: 339-204-9073 or TTY: 701-320-1461 TTY [phone]) to talk to a trained counselor call 1-800-273-TALK (toll free, 24 hour hotline) go to [facility] [place] [phone]) call 911   The patient verbalized understanding of instructions, educational materials, and care plan provided today and declined offer to receive copy of patient instructions, educational materials, and care plan.   Jacqlyn Larsen RNC, BSN RN Case Manager [place] [facility] [phone]

## 2021-04-05 DIAGNOSIS — I10 Essential (primary) hypertension: Secondary | ICD-10-CM | POA: Diagnosis not present

## 2021-04-05 DIAGNOSIS — I480 Paroxysmal atrial fibrillation: Secondary | ICD-10-CM

## 2021-04-26 ENCOUNTER — Ambulatory Visit (INDEPENDENT_AMBULATORY_CARE_PROVIDER_SITE_OTHER): Payer: Medicare HMO | Admitting: Family Medicine

## 2021-04-26 ENCOUNTER — Other Ambulatory Visit: Payer: Self-pay

## 2021-04-26 ENCOUNTER — Encounter: Payer: Self-pay | Admitting: Family Medicine

## 2021-04-26 VITALS — BP 138/62 | HR 72 | Temp 97.3°F | Resp 18 | Ht 72.0 in | Wt 229.0 lb

## 2021-04-26 DIAGNOSIS — R42 Dizziness and giddiness: Secondary | ICD-10-CM | POA: Diagnosis not present

## 2021-04-26 DIAGNOSIS — R109 Unspecified abdominal pain: Secondary | ICD-10-CM | POA: Diagnosis not present

## 2021-04-26 DIAGNOSIS — M7989 Other specified soft tissue disorders: Secondary | ICD-10-CM

## 2021-04-26 DIAGNOSIS — I2510 Atherosclerotic heart disease of native coronary artery without angina pectoris: Secondary | ICD-10-CM

## 2021-04-26 DIAGNOSIS — I480 Paroxysmal atrial fibrillation: Secondary | ICD-10-CM | POA: Diagnosis not present

## 2021-04-26 LAB — CBC WITH DIFFERENTIAL/PLATELET
Absolute Monocytes: 758 cells/uL (ref 200–950)
Basophils Absolute: 71 cells/uL (ref 0–200)
Basophils Relative: 0.9 %
Eosinophils Absolute: 300 cells/uL (ref 15–500)
Eosinophils Relative: 3.8 %
HCT: 42.6 % (ref 38.5–50.0)
Hemoglobin: 13.7 g/dL (ref 13.2–17.1)
Lymphs Abs: 2267 cells/uL (ref 850–3900)
MCH: 28.2 pg (ref 27.0–33.0)
MCHC: 32.2 g/dL (ref 32.0–36.0)
MCV: 87.8 fL (ref 80.0–100.0)
MPV: 11.6 fL (ref 7.5–12.5)
Monocytes Relative: 9.6 %
Neutro Abs: 4503 cells/uL (ref 1500–7800)
Neutrophils Relative %: 57 %
Platelets: 204 10*3/uL (ref 140–400)
RBC: 4.85 10*6/uL (ref 4.20–5.80)
RDW: 12.7 % (ref 11.0–15.0)
Total Lymphocyte: 28.7 %
WBC: 7.9 10*3/uL (ref 3.8–10.8)

## 2021-04-26 LAB — COMPLETE METABOLIC PANEL WITH GFR
AG Ratio: 1.8 (calc) (ref 1.0–2.5)
ALT: 15 U/L (ref 9–46)
AST: 14 U/L (ref 10–35)
Albumin: 4.2 g/dL (ref 3.6–5.1)
Alkaline phosphatase (APISO): 40 U/L (ref 35–144)
BUN: 17 mg/dL (ref 7–25)
CO2: 31 mmol/L (ref 20–32)
Calcium: 10.3 mg/dL (ref 8.6–10.3)
Chloride: 103 mmol/L (ref 98–110)
Creat: 1.26 mg/dL (ref 0.70–1.28)
Globulin: 2.4 g/dL (calc) (ref 1.9–3.7)
Glucose, Bld: 121 mg/dL — ABNORMAL HIGH (ref 65–99)
Potassium: 4.4 mmol/L (ref 3.5–5.3)
Sodium: 140 mmol/L (ref 135–146)
Total Bilirubin: 0.7 mg/dL (ref 0.2–1.2)
Total Protein: 6.6 g/dL (ref 6.1–8.1)
eGFR: 58 mL/min/{1.73_m2} — ABNORMAL LOW (ref >=60)

## 2021-04-26 LAB — URINALYSIS, ROUTINE W REFLEX MICROSCOPIC
Bilirubin Urine: NEGATIVE
Glucose, UA: NEGATIVE
Hgb urine dipstick: NEGATIVE
Ketones, ur: NEGATIVE
Leukocytes,Ua: NEGATIVE
Nitrite: NEGATIVE
Protein, ur: NEGATIVE
Specific Gravity, Urine: 1.015 (ref 1.001–1.035)
pH: 6.5 (ref 5.0–8.0)
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
## 2021-04-27 LAB — BRAIN NATRIURETIC PEPTIDE: Brain Natriuretic Peptide: 20 pg/mL (ref <100)

## 2021-05-03 ENCOUNTER — Ambulatory Visit
Admission: RE | Admit: 2021-05-03 | Discharge: 2021-05-03 | Disposition: A | Discharge status: Home / Self Care | Payer: Medicare HMO | Source: Ambulatory Visit | Attending: Family Medicine | Admitting: Family Medicine

## 2021-05-03 DIAGNOSIS — R42 Dizziness and giddiness: Secondary | ICD-10-CM

## 2021-05-03 DIAGNOSIS — M7989 Other specified soft tissue disorders: Secondary | ICD-10-CM

## 2021-05-03 DIAGNOSIS — E119 Type 2 diabetes mellitus without complications: Secondary | ICD-10-CM | POA: Diagnosis not present

## 2021-05-03 DIAGNOSIS — I10 Essential (primary) hypertension: Secondary | ICD-10-CM | POA: Diagnosis not present

## 2021-05-03 DIAGNOSIS — I2510 Atherosclerotic heart disease of native coronary artery without angina pectoris: Secondary | ICD-10-CM

## 2021-05-03 DIAGNOSIS — Z8673 Personal history of transient ischemic attack (TIA), and cerebral infarction without residual deficits: Secondary | ICD-10-CM | POA: Diagnosis not present

## 2021-05-03 DIAGNOSIS — E785 Hyperlipidemia, unspecified: Secondary | ICD-10-CM | POA: Diagnosis not present

## 2021-05-03 DIAGNOSIS — I480 Paroxysmal atrial fibrillation: Secondary | ICD-10-CM

## 2021-05-04 ENCOUNTER — Ambulatory Visit (HOSPITAL_COMMUNITY)
Admission: RE | Admit: 2021-05-04 | Discharge: 2021-05-04 | Disposition: A | Discharge status: Home / Self Care | Payer: Medicare HMO | Source: Ambulatory Visit | Attending: Family Medicine | Admitting: Family Medicine

## 2021-05-04 ENCOUNTER — Other Ambulatory Visit: Payer: Self-pay

## 2021-05-04 DIAGNOSIS — E785 Hyperlipidemia, unspecified: Secondary | ICD-10-CM | POA: Diagnosis not present

## 2021-05-04 DIAGNOSIS — M7989 Other specified soft tissue disorders: Secondary | ICD-10-CM | POA: Insufficient documentation

## 2021-05-04 DIAGNOSIS — I3481 Nonrheumatic mitral (valve) annulus calcification: Secondary | ICD-10-CM | POA: Diagnosis not present

## 2021-05-04 DIAGNOSIS — R42 Dizziness and giddiness: Secondary | ICD-10-CM | POA: Insufficient documentation

## 2021-05-04 DIAGNOSIS — I2510 Atherosclerotic heart disease of native coronary artery without angina pectoris: Secondary | ICD-10-CM | POA: Diagnosis present

## 2021-05-04 DIAGNOSIS — I480 Paroxysmal atrial fibrillation: Secondary | ICD-10-CM | POA: Insufficient documentation

## 2021-05-04 DIAGNOSIS — E119 Type 2 diabetes mellitus without complications: Secondary | ICD-10-CM | POA: Insufficient documentation

## 2021-05-04 DIAGNOSIS — I10 Essential (primary) hypertension: Secondary | ICD-10-CM | POA: Insufficient documentation

## 2021-05-04 LAB — ECHOCARDIOGRAM COMPLETE
Area-P 1/2: 3.08 cm2
S' Lateral: 3.2 cm

## 2021-06-03 ENCOUNTER — Ambulatory Visit (INDEPENDENT_AMBULATORY_CARE_PROVIDER_SITE_OTHER): Payer: Medicare HMO | Admitting: *Deleted

## 2021-06-03 DIAGNOSIS — I10 Essential (primary) hypertension: Secondary | ICD-10-CM | POA: Diagnosis not present

## 2021-06-03 DIAGNOSIS — I480 Paroxysmal atrial fibrillation: Secondary | ICD-10-CM

## 2021-06-03 NOTE — Patient Instructions (Signed)
Visit Information ? ?Thank you for taking time to visit with me today. Please don't hesitate to contact me if I can be of assistance to you before our next scheduled telephone appointment. ? ?Following are the goals we discussed today:  ?Take medications as prescribed   ?Attend all scheduled provider appointments ?Perform all self care activities independently  ?Perform IADL's (shopping, preparing meals, housekeeping, managing finances) independently ?Call provider office for new concerns or questions  ?- check pulse (heart) rate once a day ?- make a plan to exercise regularly ?- make a plan to eat healthy ?- keep all lab appointments ?- take medicine as prescribed ?check blood pressure weekly ?choose a place to take my blood pressure (home, clinic or office, retail store) ?write blood pressure results in a log or diary ?keep a blood pressure log ?take blood pressure log to all doctor appointments ?call doctor for signs and symptoms of high blood pressure ?eat more whole grains, fruits and vegetables, lean meats and healthy fats ?Take care of yourself, get plenty of rest, try to relax when you can ?Continue getting outdoors daily if possible ?Elevate legs when sitting ?Follow a low sodium diet- read labels, limit fast food ? ?Our next appointment is by telephone on 09/02/21 at 9 am ? ?Please call the care guide team at [phone] if you need to cancel or reschedule your appointment.  ? ?If you are experiencing a Mental Health or [place] or need someone to talk to, please call the Suicide and Crisis Lifeline: 988 ?call the [place] National Suicide Prevention Lifeline: 782 131 7947 or TTY: (580)203-6240 TTY [phone]) to talk to a trained counselor ?call 1-800-273-TALK (toll free, 24 hour hotline) ?go to [facility] [address], [place] [phone]) ?call 911  ? ?The patient verbalized understanding of instructions, educational materials, and care plan  provided today and declined offer to receive copy of patient instructions, educational materials, and care plan.  ? ?Jacqlyn Larsen RNC, BSN ?RN Case Manager ?[place] ?[phone] ? ?

## 2021-06-03 NOTE — Chronic Care Management (AMB) (Signed)
?Chronic Care Management  ? ?CCM RN Visit Note ? ?06/03/2021 ?Name: Henry Martin MRN: [id number] DOB: 01-May-1940 ? ?Subjective: ?Henry Martin is a 81 y.o. year old male who is a primary care patient of Pickard, Cammie Mcgee, MD. The care management team was consulted for assistance with disease management and care coordination needs.   ? ?Engaged with patient by telephone for follow up visit in response to provider referral for case management and/or care coordination services.  ? ?Consent to Services:  ?The patient was given information about Chronic Care Management services, agreed to services, and gave verbal consent prior to initiation of services.  Please see initial visit note for detailed documentation.  ? ?Patient agreed to services and verbal consent obtained.  ? ?Assessment: Review of patient past medical history, allergies, medications, health status, including review of consultants reports, laboratory and other test data, was performed as part of comprehensive evaluation and provision of chronic care management services.  ? ?SDOH (Social Determinants of Health) assessments and interventions performed:   ? ?CCM Care Plan ? ?Allergies  ?Allergen Reactions  ? Contrast Media [Iodinated Contrast Media] Other (See Comments)  ?  "Isovue"; "blisters; all down my legs"  ? Metrizamide Other (See Comments)  ?  "Isovue"; "blisters; all down my legs"  ? ? ?Outpatient Encounter Medications as of 06/03/2021  ?Medication Sig  ? atorvastatin (LIPITOR) 10 MG tablet TAKE 1 TABLET DAILY  ? Blood Glucose Monitoring Suppl (ACCU-CHEK AVIVA PLUS) w/Device KIT Use to check BS QAM  ? Cholecalciferol (VITAMIN D3) 1000 UNITS CAPS Take 1,000 Units by mouth daily.   ? Fish Oil OIL Take 800 mg by mouth 2 (two) times daily.   ? glucose blood (ACCU-CHEK AVIVA PLUS) test strip Check BC QAM DX: e11.9  ? hydrochlorothiazide (HYDRODIURIL) 25 MG tablet TAKE 1 TABLET BY MOUTH ONCE A DAY  ? Lancets (ACCU-CHEK MULTICLIX) lancets Check BC QAM DX:  e11.9  ? Lancets Misc. (ACCU-CHEK MULTICLIX LANCET DEV) KIT Check BC QAM DX: e11.9  ? metFORMIN (GLUCOPHAGE) 500 MG tablet TAKE 2 TABLETS BY MOUTH  DAILY WITH A MEAL  ? metoprolol succinate (TOPROL-XL) 25 MG 24 hr tablet TAKE 1/2 TABLET AT BEDTIME  ? olmesartan (BENICAR) 40 MG tablet TAKE 1 TABLET DAILY.  ? XARELTO 20 MG TABS tablet TAKE 1 TABLET BY MOUTH ONCE DAILY  ? ?No facility-administered encounter medications on file as of 06/03/2021.  ? ? ?Patient Active Problem List  ? Diagnosis Date Noted  ? Pain in right knee 11/16/2017  ? OA (osteoarthritis) of knee 11/14/2017  ? Chronic anticoagulation 02/01/2016  ? History of stroke 02/01/2016  ? Sleep apnea 02/01/2016  ? Personal history of colonic polyps 12/26/2012  ? Diarrhea 07/05/2011  ? PAF (paroxysmal atrial fibrillation) ([place]) 07/04/2011  ? HLD (hyperlipidemia) 03/23/2008  ? Essential hypertension 03/23/2008  ? CAD (coronary artery disease) 03/23/2008  ? ? ?Conditions to be addressed/monitored:Atrial Fibrillation and HTN ? ?Care Plan : RN Care Manager Plan of Care  ?Updates made by Kassie Mends, RN since 06/03/2021 12:00 AM  ?  ? ?Problem: No plan of care established for management of chronic disease states (HTN, Atrial fibrillation)   ?Priority: High  ?  ? ?Long-Range Goal: Development of plan of care for chronic disease management (HTN, Atrial Fibrillation)   ?Start Date: 03/25/2021  ?Expected End Date: 09/21/2021  ?Priority: High  ?Note:   ?Current Barriers:  ?Knowledge Deficits related to plan of care for management of Atrial Fibrillation and HTN  ?  Ineffective Self Health Maintenance in a patient with Atrial Fibrillation- can benefit from reinforcement on Atrial Fibrillation action plan, pt lives with spouse (has dementia and is now on hospice at home)  has assistance in the home for her, daughter has moved here to assist with her mother, pt is very busy caring for his wife.  Pt feels he is managing " pretty well" with health conditions at present, has  medications and taking as prescribed, continues xarelto. ?Knowledge Deficits related to basic understanding of hypertension pathophysiology and self care management-  needs reinforcement of low sodium diet, importance of keeping blood pressure managed for overall health, pt reports he checks BP 3-4 x month and "readings are good" Eats fast food " sometimes', is part time [employer] and stays busy, gets out for church and other social activities at times,  Reports does not check CBG and has not been instructed to do so.  Most recent AIC per pt 6.0. ?Patient reports he had echocardiogram on 05/04/21 and will be discussing with primary care provider, pt reports this was ordered due to having leg swelling and dyspnea at times, patient reports " my legs swell up sometimes but not like this all the time" ? ?RNCM Clinical Goal(s):  ?Patient will verbalize understanding of plan for management of Atrial Fibrillation and HTN as evidenced by patient report, review of EHR and  through collaboration with RN Care manager, provider, and care team.  ? ?Interventions: ?1:1 collaboration with primary care provider regarding development and update of comprehensive plan of care as evidenced by provider attestation and co-signature ?Inter-disciplinary care team collaboration (see longitudinal plan of care) ?Evaluation of current treatment plan related to  self management and patient's adherence to plan as established by provider ? ? ?AFIB Interventions: (Status:  Goal on track:  Yes.) Long Term Goal ?  Reviewed importance of adherence to anticoagulant exactly as prescribed ?Counseled on importance of regular laboratory monitoring as prescribed ?Afib action plan reviewed ?Reinforced importance of staying active and getting outside daily, weather permitting ?Encouraged adequate rest and relaxation to avoid caregiver burnout ?Pain assessment completed ? ?Hypertension Interventions:  (Status:  Goal on track:  NO.) Long Term Goal ?Last practice  recorded BP readings:  ?BP Readings from Last 3 Encounters:  ?10/22/20 (!) 144/82  ?07/15/20 (!) 160/70  ?10/30/19 130/78  ?Most recent eGFR/CrCl:  ?Lab Results  ?Component Value Date  ? EGFR 53 (L) 10/15/2020  ?  No components found for: CRCL ? ?Evaluation of current treatment plan related to hypertension self management and patient's adherence to plan as established by provider ?Reviewed medications with patient and discussed importance of compliance ?Counseled on the importance of exercise goals with target of 150 minutes per week ?Advised patient, providing education and rationale, to monitor blood pressure daily and record, calling PCP for findings outside established parameters  ?Reviewed low sodium diet ?Reviewed elevating legs when sitting ? ?Patient Goals/Self-Care Activities: ?Take medications as prescribed   ?Attend all scheduled provider appointments ?Perform all self care activities independently  ?Perform IADL's (shopping, preparing meals, housekeeping, managing finances) independently ?Call provider office for new concerns or questions  ?- check pulse (heart) rate once a day ?- make a plan to exercise regularly ?- make a plan to eat healthy ?- keep all lab appointments ?- take medicine as prescribed ?check blood pressure weekly ?choose a place to take my blood pressure (home, clinic or office, retail store) ?write blood pressure results in a log or diary ?keep a blood pressure log ?  take blood pressure log to all doctor appointments ?call doctor for signs and symptoms of high blood pressure ?eat more whole grains, fruits and vegetables, lean meats and healthy fats ?Take care of yourself, get plenty of rest, try to relax when you can ?Continue getting outdoors daily if possible ?Elevate legs when sitting ?Follow a low sodium diet- read labels, limit fast food ? ? ?  ? ? ?Plan:Telephone follow up appointment with care management team member scheduled for:  09/02/21 ? ?Jacqlyn Larsen RNC, BSN ?RN Case  Manager ?[place] ?[phone] ? ? ? ? ? ? ? ? ? ?

## 2021-06-17 ENCOUNTER — Ambulatory Visit (INDEPENDENT_AMBULATORY_CARE_PROVIDER_SITE_OTHER): Payer: Medicare HMO | Admitting: Family Medicine

## 2021-06-17 DIAGNOSIS — I10 Essential (primary) hypertension: Secondary | ICD-10-CM | POA: Diagnosis not present

## 2021-06-17 DIAGNOSIS — E1169 Type 2 diabetes mellitus with other specified complication: Secondary | ICD-10-CM | POA: Diagnosis not present

## 2021-06-17 DIAGNOSIS — N401 Enlarged prostate with lower urinary tract symptoms: Secondary | ICD-10-CM | POA: Diagnosis not present

## 2021-06-17 DIAGNOSIS — K439 Ventral hernia without obstruction or gangrene: Secondary | ICD-10-CM | POA: Diagnosis not present

## 2021-06-17 DIAGNOSIS — I2510 Atherosclerotic heart disease of native coronary artery without angina pectoris: Secondary | ICD-10-CM

## 2021-06-17 MED ORDER — TAMSULOSIN HCL 0.4 MG PO CAPS: 0.4000 mg | ORAL_CAPSULE | Freq: Every day | ORAL | 3 refills | Status: DC
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
## 2021-06-18 LAB — CBC WITH DIFFERENTIAL/PLATELET
Absolute Monocytes: 845 cells/uL (ref 200–950)
Basophils Absolute: 79 cells/uL (ref 0–200)
Basophils Relative: 0.9 %
Eosinophils Absolute: 202 cells/uL (ref 15–500)
Eosinophils Relative: 2.3 %
HCT: 41 % (ref 38.5–50.0)
Hemoglobin: 13.4 g/dL (ref 13.2–17.1)
Lymphs Abs: 3001 cells/uL (ref 850–3900)
MCH: 28.6 pg (ref 27.0–33.0)
MCHC: 32.7 g/dL (ref 32.0–36.0)
MCV: 87.6 fL (ref 80.0–100.0)
MPV: 12 fL (ref 7.5–12.5)
Monocytes Relative: 9.6 %
Neutro Abs: 4673 cells/uL (ref 1500–7800)
Neutrophils Relative %: 53.1 %
Platelets: 213 10*3/uL (ref 140–400)
RBC: 4.68 10*6/uL (ref 4.20–5.80)
RDW: 12.5 % (ref 11.0–15.0)
Total Lymphocyte: 34.1 %
WBC: 8.8 10*3/uL (ref 3.8–10.8)

## 2021-06-18 LAB — COMPLETE METABOLIC PANEL WITH GFR
AG Ratio: 1.6 (calc) (ref 1.0–2.5)
ALT: 14 U/L (ref 9–46)
AST: 13 U/L (ref 10–35)
Albumin: 4.2 g/dL (ref 3.6–5.1)
Alkaline phosphatase (APISO): 39 U/L (ref 35–144)
BUN/Creatinine Ratio: 17 (calc) (ref 6–22)
BUN: 22 mg/dL (ref 7–25)
CO2: 26 mmol/L (ref 20–32)
Calcium: 10.3 mg/dL (ref 8.6–10.3)
Chloride: 106 mmol/L (ref 98–110)
Creat: 1.31 mg/dL — ABNORMAL HIGH (ref 0.70–1.22)
Globulin: 2.6 g/dL (calc) (ref 1.9–3.7)
Glucose, Bld: 90 mg/dL (ref 65–99)
Potassium: 4.5 mmol/L (ref 3.5–5.3)
Sodium: 142 mmol/L (ref 135–146)
Total Bilirubin: 0.6 mg/dL (ref 0.2–1.2)
Total Protein: 6.8 g/dL (ref 6.1–8.1)
eGFR: 55 mL/min/{1.73_m2} — ABNORMAL LOW (ref >=60)

## 2021-06-18 LAB — HEMOGLOBIN A1C
Hgb A1c MFr Bld: 6.4 % of total Hgb — ABNORMAL HIGH (ref <5.7)
Mean Plasma Glucose: 137 mg/dL
eAG (mmol/L): 7.6 mmol/L

## 2021-06-21 ENCOUNTER — Ambulatory Visit
Admission: RE | Admit: 2021-06-21 | Discharge: 2021-06-21 | Disposition: A | Discharge status: Home / Self Care | Payer: Medicare HMO | Source: Ambulatory Visit | Attending: Family Medicine | Admitting: Family Medicine

## 2021-06-21 ENCOUNTER — Other Ambulatory Visit: Payer: Self-pay | Admitting: Family Medicine

## 2021-06-21 DIAGNOSIS — K439 Ventral hernia without obstruction or gangrene: Secondary | ICD-10-CM | POA: Diagnosis not present

## 2021-06-22 ENCOUNTER — Other Ambulatory Visit: Payer: Medicare HMO

## 2021-07-12 ENCOUNTER — Ambulatory Visit: Payer: Medicare HMO | Admitting: Urology

## 2021-07-12 ENCOUNTER — Encounter: Payer: Self-pay | Admitting: Urology

## 2021-07-12 VITALS — BP 154/75 | HR 61 | Ht 72.0 in | Wt 225.0 lb

## 2021-07-12 DIAGNOSIS — N5201 Erectile dysfunction due to arterial insufficiency: Secondary | ICD-10-CM | POA: Diagnosis not present

## 2021-07-12 DIAGNOSIS — Z85528 Personal history of other malignant neoplasm of kidney: Secondary | ICD-10-CM

## 2021-07-12 DIAGNOSIS — N138 Other obstructive and reflux uropathy: Secondary | ICD-10-CM | POA: Diagnosis not present

## 2021-07-12 DIAGNOSIS — R351 Nocturia: Secondary | ICD-10-CM | POA: Diagnosis not present

## 2021-07-12 DIAGNOSIS — N401 Enlarged prostate with lower urinary tract symptoms: Secondary | ICD-10-CM | POA: Diagnosis not present

## 2021-07-12 DIAGNOSIS — R350 Frequency of micturition: Secondary | ICD-10-CM | POA: Diagnosis not present
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
## 2021-07-17 ENCOUNTER — Telehealth: Payer: Self-pay | Admitting: Family Medicine

## 2021-07-19 ENCOUNTER — Other Ambulatory Visit: Payer: Self-pay | Admitting: Family Medicine

## 2021-07-19 NOTE — Telephone Encounter (Signed)
Requested Prescriptions  ?Pending Prescriptions Disp Refills  ?? atorvastatin (LIPITOR) 10 MG tablet [Pharmacy Med Name: ATORVASTATIN TAB '10MG'$ ] 90 tablet 0  ?  Sig: TAKE 1 TABLET DAILY  ?  ? Cardiovascular:  Antilipid - Statins Failed - 07/17/2021  8:21 AM  ?  ?  Failed - Lipid Panel in normal range within the last 12 months  ?  Cholesterol  ?Date Value Ref Range Status  ?10/15/2020 102 <200 mg/dL Final  ? ?LDL Cholesterol (Calc)  ?Date Value Ref Range Status  ?10/15/2020 48 mg/dL (calc) Final  ?  Comment:  ?  Reference range: <100 ?Marland Kitchen ?Desirable range <100 mg/dL for primary prevention;   ?<70 mg/dL for patients with CHD or diabetic patients  ?with > or = 2 CHD risk factors. ?. ?LDL-C is now calculated using the Martin-Hopkins  ?calculation, which is a validated novel method providing  ?better accuracy than the Friedewald equation in the  ?estimation of LDL-C.  ?Cresenciano Genre et al. Annamaria Helling. 7829;562(13): 2061-2068  ?(http://education.[facility].com/faq/FAQ164) ?  ? ?HDL  ?Date Value Ref Range Status  ?10/15/2020 38 (L) > OR = 40 mg/dL Final  ? ?Triglycerides  ?Date Value Ref Range Status  ?10/15/2020 81 <150 mg/dL Final  ? ?  ?  ?  Passed - Patient is not pregnant  ?  ?  Passed - Valid encounter within last 12 months  ?  Recent Outpatient Visits   ?      ? 1 month ago Essential hypertension  ? [facility] Pickard, Cammie Mcgee, MD  ? 2 months ago Right flank pain  ? [place] Pickard, Cammie Mcgee, MD  ? 9 months ago Encounter for [employer] annual wellness exam  ? [facility] Susy Frizzle, MD  ? 1 year ago Diabetes mellitus type 2, uncontrolled, with complications ([place])  ? [facility] Pickard, Cammie Mcgee, MD  ? 2 years ago Diabetes mellitus type 2, uncontrolled, with complications ([place])  ? [facility] Pickard, Cammie Mcgee, MD  ?  ?  ?Future Appointments   ?        ? In 1 month Franchot Gallo, MD [place]  ?  ? ?  ?   ?  ? ? ?

## 2021-07-20 NOTE — Telephone Encounter (Signed)
Requested medication (s) are due for refill today -no ? ?Requested medication (s) are on the active medication list - yes ? ?Future visit scheduled -no ? ?Last refill: 03/08/21 #360 3RF ? ?Notes to clinic: Insurance is requesting #100 day supply- will require change to Rx- please review direction- 2 tablet daily- believe patient is taking 4 tablets daily ? ?Requested Prescriptions  ?Pending Prescriptions Disp Refills  ? metFORMIN (GLUCOPHAGE) 500 MG tablet [Pharmacy Med Name: METFORMIN HCL 500 MG TAB] 360 tablet 3  ?  Sig: TAKE 2 TABLETS BY MOUTH  DAILY WITH A MEAL  ?  ? Endocrinology:  Diabetes - Biguanides Failed - 07/19/2021  1:57 PM  ?  ?  Failed - Cr in normal range and within 360 days  ?  Creat  ?Date Value Ref Range Status  ?06/17/2021 1.31 (H) 0.70 - 1.22 mg/dL Final  ? ?Creatinine, Urine  ?Date Value Ref Range Status  ?10/15/2020 156 20 - 320 mg/dL Final  ?   ?  ?  Failed - eGFR in normal range and within 360 days  ?  GFR, Est African American  ?Date Value Ref Range Status  ?10/27/2019 71 > OR = 60 mL/min/1.51m Final  ? ?GFR, Est Non African American  ?Date Value Ref Range Status  ?10/27/2019 61 > OR = 60 mL/min/1.752mFinal  ? ?GFR  ?Date Value Ref Range Status  ?09/26/2011 65.44 >60.00 mL/min Final  ? ?eGFR  ?Date Value Ref Range Status  ?06/17/2021 55 (L) > OR = 60 mL/min/1.73110minal  ?  Comment:  ?  The eGFR is based on the CKD-EPI 2021 equation. To calculate  ?the new eGFR from a previous Creatinine or Cystatin C ?result, go to https://www.kidney.org/professionals/ ?kdoqi/gfr%5Fcalculator ?  ?   ?  ?  Failed - B12 Level in normal range and within 720 days  ?  No results found for: VITAMINB12   ?  ?  Passed - HBA1C is between 0 and 7.9 and within 180 days  ?  Hgb A1C (fingerstick)  ?Date Value Ref Range Status  ?09/10/2018 6.0 (H) <6.0 % OF TOTAL HGB Final  ? ?Hgb A1c MFr Bld  ?Date Value Ref Range Status  ?06/17/2021 6.4 (H) <5.7 % of total Hgb Final  ?  Comment:  ?  For someone without known diabetes,  a hemoglobin  ?A1c value between 5.7% and 6.4% is consistent with ?prediabetes and should be confirmed with a  ?follow-up test. ?. ?For someone with known diabetes, a value <7% ?indicates that their diabetes is well controlled. A1c ?targets should be individualized based on duration of ?diabetes, age, comorbid conditions, and other ?considerations. ?. ?This assay result is consistent with an increased risk ?of diabetes. ?. ?Currently, no consensus exists regarding use of ?hemoglobin A1c for diagnosis of diabetes for children. ?. ?  ?   ?  ?  Passed - Valid encounter within last 6 months  ?  Recent Outpatient Visits   ? ?      ? 1 month ago [facility]  [facility] War[facility]ammie McgeeD  ? 2 months ago Right flank pain  ? [place]ie McgeeD  ? 9 months ago Encounter for [employer] wellness exam  ? [facility]  ? 1 year ago Diabetes mellitus type 2, uncontrolled, [place]? [facility], WarCammie McgeeD  ? 2 years ago Diabetes mellitus  type 2, uncontrolled, with complications ([place])  ? [facility] Pickard, Cammie Mcgee, MD  ? ?  ?  ?Future Appointments   ? ?        ? In 1 month Dahlstedt, Annie Main, MD [place]  ? ?  ? ? ?  ?  ?  Passed - CBC within normal limits and completed in the last 12 months  ?  WBC  ?Date Value Ref Range Status  ?06/17/2021 8.8 3.8 - 10.8 Thousand/uL Final  ? ?RBC  ?Date Value Ref Range Status  ?06/17/2021 4.68 4.20 - 5.80 Million/uL Final  ? ?Hemoglobin  ?Date Value Ref Range Status  ?06/17/2021 13.4 13.2 - 17.1 g/dL Final  ? ?HCT  ?Date Value Ref Range Status  ?06/17/2021 41.0 38.5 - 50.0 % Final  ? ?MCHC  ?Date Value Ref Range Status  ?06/17/2021 32.7 32.0 - 36.0 g/dL Final  ? ?MCH  ?Date Value Ref Range Status  ?06/17/2021 28.6 27.0 - 33.0 pg Final  ? ?MCV  ?Date Value Ref Range Status  ?06/17/2021 87.6 80.0 - 100.0  fL Final  ? ?No results found for: PLTCOUNTKUC, LABPLAT, [place] ?RDW  ?Date Value Ref Range Status  ?06/17/2021 12.5 11.0 - 15.0 % Final  ? ?  ?  ?  ? ? ? ?Requested Prescriptions  ?Pending Prescriptions Disp Refills  ? metFORMIN (GLUCOPHAGE) 500 MG tablet [Pharmacy Med Name: METFORMIN HCL 500 MG TAB] 360 tablet 3  ?  Sig: TAKE 2 TABLETS BY MOUTH  DAILY WITH A MEAL  ?  ? Endocrinology:  Diabetes - Biguanides Failed - 07/19/2021  1:57 PM  ?  ?  Failed - Cr in normal range and within 360 days  ?  Creat  ?Date Value Ref Range Status  ?06/17/2021 1.31 (H) 0.70 - 1.22 mg/dL Final  ? ?Creatinine, Urine  ?Date Value Ref Range Status  ?10/15/2020 156 20 - 320 mg/dL Final  ?   ?  ?  Failed - eGFR in normal range and within 360 days  ?  GFR, Est African American  ?Date Value Ref Range Status  ?10/27/2019 71 > OR = 60 mL/min/1.47m Final  ? ?GFR, Est Non African American  ?Date Value Ref Range Status  ?10/27/2019 61 > OR = 60 mL/min/1.764mFinal  ? ?GFR  ?Date Value Ref Range Status  ?09/26/2011 65.44 >60.00 mL/min Final  ? ?eGFR  ?Date Value Ref Range Status  ?06/17/2021 55 (L) > OR = 60 mL/min/1.734minal  ?  Comment:  ?  The eGFR is based on the CKD-EPI 2021 equation. To calculate  ?the new eGFR from a previous Creatinine or Cystatin C ?result, go to https://www.kidney.org/professionals/ ?kdoqi/gfr%5Fcalculator ?  ?   ?  ?  Failed - B12 Level in normal range and within 720 days  ?  No results found for: VITAMINB12   ?  ?  Passed - HBA1C is between 0 and 7.9 and within 180 days  ?  Hgb A1C (fingerstick)  ?Date Value Ref Range Status  ?09/10/2018 6.0 (H) <6.0 % OF TOTAL HGB Final  ? ?Hgb A1c MFr Bld  ?Date Value Ref Range Status  ?06/17/2021 6.4 (H) <5.7 % of total Hgb Final  ?  Comment:  ?  For someone without known diabetes, a hemoglobin  ?A1c value between 5.7% and 6.4% is consistent with ?prediabetes and should be confirmed with a  ?follow-up test. ?. ?For someone with known diabetes, a value <7% ?indicates that their  diabetes is well controlled. A1c ?targets should be individualized based on duration of ?diabetes, age, comorbid conditions, and other ?considerations. ?. ?This assay result is consistent with an increased risk ?of diabetes. ?. ?Currently, no consensus exists regarding use of ?hemoglobin A1c for diagnosis of diabetes for children. ?. ?  ?   ?  ?  Passed - Valid encounter within last 6 months  ?  Recent Outpatient Visits   ? ?      ? 1 month ago Essential hypertension  ? [facility] Pickard, Cammie Mcgee, MD  ? 2 months ago Right flank pain  ? [place] Pickard, Cammie Mcgee, MD  ? 9 months ago Encounter for [employer] annual wellness exam  ? [facility] Susy Frizzle, MD  ? 1 year ago Diabetes mellitus type 2, uncontrolled, with complications ([place])  ? [facility] Pickard, Cammie Mcgee, MD  ? 2 years ago Diabetes mellitus type 2, uncontrolled, with complications ([place])  ? [facility] Pickard, Cammie Mcgee, MD  ? ?  ?  ?Future Appointments   ? ?        ? In 1 month Dahlstedt, Annie Main, MD [place]  ? ?  ? ? ?  ?  ?  Passed - CBC within normal limits and completed in the last 12 months  ?  WBC  ?Date Value Ref Range Status  ?06/17/2021 8.8 3.8 - 10.8 Thousand/uL Final  ? ?RBC  ?Date Value Ref Range Status  ?06/17/2021 4.68 4.20 - 5.80 Million/uL Final  ? ?Hemoglobin  ?Date Value Ref Range Status  ?06/17/2021 13.4 13.2 - 17.1 g/dL Final  ? ?HCT  ?Date Value Ref Range Status  ?06/17/2021 41.0 38.5 - 50.0 % Final  ? ?MCHC  ?Date Value Ref Range Status  ?06/17/2021 32.7 32.0 - 36.0 g/dL Final  ? ?MCH  ?Date Value Ref Range Status  ?06/17/2021 28.6 27.0 - 33.0 pg Final  ? ?MCV  ?Date Value Ref Range Status  ?06/17/2021 87.6 80.0 - 100.0 fL Final  ? ?No results found for: PLTCOUNTKUC, LABPLAT, [place] ?RDW  ?Date Value Ref Range Status  ?06/17/2021 12.5 11.0 - 15.0 % Final  ? ?  ?  ?  ? ? ? ?

## 2021-07-22 ENCOUNTER — Ambulatory Visit (INDEPENDENT_AMBULATORY_CARE_PROVIDER_SITE_OTHER): Payer: Medicare HMO

## 2021-07-22 VITALS — Wt 225.0 lb

## 2021-07-22 DIAGNOSIS — Z0000 Encounter for general adult medical examination without abnormal findings: Secondary | ICD-10-CM | POA: Diagnosis not present
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
## 2021-07-22 NOTE — Patient Instructions (Signed)
[name] , Thank you for taking time to come for your Medicare Wellness Visit. I appreciate your ongoing commitment to your health goals. Please review the following plan we discussed and let me know if I can assist you in the future.   Screening recommendations/referrals: Colonoscopy: Done 01/07/2013 - no longer required Recommended yearly ophthalmology/optometry visit for glaucoma screening and checkup Recommended yearly dental visit for hygiene and checkup  Vaccinations: Influenza vaccine: Due every fall Pneumococcal vaccine: Done 09/02/2013 & 10/05/2015 Tdap vaccine: Done 09/12/2018 - Repeat in 10 years Shingles vaccine: Done 01/19/2017 & 04/30/2017 Covid-19: Done 05/28/2019, 06/25/2019, & 03/30/2020  Advanced directives: Please bring a copy of your health care power of attorney and living will to the office to be added to your chart at your convenience.   Conditions/risks identified: Aim for 30 minutes of exercise or brisk walking, 6-8 glasses of water, and 5 servings of fruits and vegetables each day.   Next appointment: Follow up in one year for your annual wellness visit.   Preventive Care 65 Years and Older, Male  Preventive care refers to lifestyle choices and visits with your health care provider that can promote health and wellness. What does preventive care include? A yearly physical exam. This is also called an annual well check. Dental exams once or twice a year. Routine eye exams. Ask your health care provider how often you should have your eyes checked. Personal lifestyle choices, including: Daily care of your teeth and gums. Regular physical activity. Eating a healthy diet. Avoiding tobacco and drug use. Limiting alcohol use. Practicing safe sex. Taking low doses of aspirin every day. Taking vitamin and mineral supplements as recommended by your health care provider. What happens during an annual well check? The services and screenings done by your health care provider  during your annual well check will depend on your age, overall health, lifestyle risk factors, and family history of disease. Counseling  Your health care provider may ask you questions about your: Alcohol use. Tobacco use. Drug use. Emotional well-being. Home and relationship well-being. Sexual activity. Eating habits. History of falls. Memory and ability to understand (cognition). Work and work Statistician. Screening  You may have the following tests or measurements: Height, weight, and BMI. Blood pressure. Lipid and cholesterol levels. These may be checked every 5 years, or more frequently if you are over 29 years old. Skin check. Lung cancer screening. You may have this screening every year starting at age 27 if you have a 30-pack-year history of smoking and currently smoke or have quit within the past 15 years. Fecal occult blood test (FOBT) of the stool. You may have this test every year starting at age 1. Flexible sigmoidoscopy or colonoscopy. You may have a sigmoidoscopy every 5 years or a colonoscopy every 10 years starting at age 55. Prostate cancer screening. Recommendations will vary depending on your family history and other risks. Hepatitis C blood test. Hepatitis B blood test. Sexually transmitted disease (STD) testing. Diabetes screening. This is done by checking your blood sugar (glucose) after you have not eaten for a while (fasting). You may have this done every 1-3 years. Abdominal aortic aneurysm (AAA) screening. You may need this if you are a current or former smoker. Osteoporosis. You may be screened starting at age 43 if you are at high risk. Talk with your health care provider about your test results, treatment options, and if necessary, the need for more tests. Vaccines  Your health care provider may recommend certain vaccines,  such as: Influenza vaccine. This is recommended every year. Tetanus, diphtheria, and acellular pertussis (Tdap, Td) vaccine. You  may need a Td booster every 10 years. Zoster vaccine. You may need this after age 4. Pneumococcal 13-valent conjugate (PCV13) vaccine. One dose is recommended after age 26. Pneumococcal polysaccharide (PPSV23) vaccine. One dose is recommended after age 54. Talk to your health care provider about which screenings and vaccines you need and how often you need them. This information is not intended to replace advice given to you by your health care provider. Make sure you discuss any questions you have with your health care provider. Document Released: 03/19/2015 Document Revised: 11/10/2015 Document Reviewed: 12/22/2014 Elsevier Interactive Patient Education  2017 [employer] Prevention in the Home Falls can cause injuries. They can happen to people of all ages. There are many things you can do to make your home safe and to help prevent falls. What can I do on the outside of my home? Regularly fix the edges of walkways and driveways and fix any cracks. Remove anything that might make you trip as you walk through a door, such as a raised step or threshold. Trim any bushes or trees on the path to your home. Use bright outdoor lighting. Clear any walking paths of anything that might make someone trip, such as rocks or tools. Regularly check to see if handrails are loose or broken. Make sure that both sides of any steps have handrails. Any raised decks and porches should have guardrails on the edges. Have any leaves, snow, or ice cleared regularly. Use sand or salt on walking paths during winter. Clean up any spills in your garage right away. This includes oil or grease spills. What can I do in the bathroom? Use night lights. Install grab bars by the toilet and in the tub and shower. Do not use towel bars as grab bars. Use non-skid mats or decals in the tub or shower. If you need to sit down in the shower, use a plastic, non-slip stool. Keep the floor dry. Clean up any water that spills  on the floor as soon as it happens. Remove soap buildup in the tub or shower regularly. Attach bath mats securely with double-sided non-slip rug tape. Do not have throw rugs and other things on the floor that can make you trip. What can I do in the bedroom? Use night lights. Make sure that you have a light by your bed that is easy to reach. Do not use any sheets or blankets that are too big for your bed. They should not hang down onto the floor. Have a firm chair that has side arms. You can use this for support while you get dressed. Do not have throw rugs and other things on the floor that can make you trip. What can I do in the kitchen? Clean up any spills right away. Avoid walking on wet floors. Keep items that you use a lot in easy-to-reach places. If you need to reach something above you, use a strong step stool that has a grab bar. Keep electrical cords out of the way. Do not use floor polish or wax that makes floors slippery. If you must use wax, use non-skid floor wax. Do not have throw rugs and other things on the floor that can make you trip. What can I do with my stairs? Do not leave any items on the stairs. Make sure that there are handrails on both sides of the stairs and  use them. Fix handrails that are broken or loose. Make sure that handrails are as long as the stairways. Check any carpeting to make sure that it is firmly attached to the stairs. Fix any carpet that is loose or worn. Avoid having throw rugs at the top or bottom of the stairs. If you do have throw rugs, attach them to the floor with carpet tape. Make sure that you have a light switch at the top of the stairs and the bottom of the stairs. If you do not have them, ask someone to add them for you. What else can I do to help prevent falls? Wear shoes that: Do not have high heels. Have rubber bottoms. Are comfortable and fit you well. Are closed at the toe. Do not wear sandals. If you use a stepladder: Make  sure that it is fully opened. Do not climb a closed stepladder. Make sure that both sides of the stepladder are locked into place. Ask someone to hold it for you, if possible. Clearly mark and make sure that you can see: Any grab bars or handrails. First and last steps. Where the edge of each step is. Use tools that help you move around (mobility aids) if they are needed. These include: Canes. Walkers. Scooters. Crutches. Turn on the lights when you go into a dark area. Replace any light bulbs as soon as they burn out. Set up your furniture so you have a clear path. Avoid moving your furniture around. If any of your floors are uneven, fix them. If there are any pets around you, be aware of where they are. Review your medicines with your doctor. Some medicines can make you feel dizzy. This can increase your chance of falling. Ask your doctor what other things that you can do to help prevent falls. This information is not intended to replace advice given to you by your health care provider. Make sure you discuss any questions you have with your health care provider. Document Released: 12/17/2008 Document Revised: 07/29/2015 Document Reviewed: 03/27/2014 Elsevier Interactive Patient Education  2017 [employer].

## 2021-08-02 DIAGNOSIS — D6869 Other thrombophilia: Secondary | ICD-10-CM | POA: Diagnosis not present

## 2021-08-02 DIAGNOSIS — I4891 Unspecified atrial fibrillation: Secondary | ICD-10-CM | POA: Diagnosis not present

## 2021-08-02 DIAGNOSIS — E785 Hyperlipidemia, unspecified: Secondary | ICD-10-CM | POA: Diagnosis not present

## 2021-08-02 DIAGNOSIS — G4733 Obstructive sleep apnea (adult) (pediatric): Secondary | ICD-10-CM | POA: Diagnosis not present

## 2021-08-02 DIAGNOSIS — I10 Essential (primary) hypertension: Secondary | ICD-10-CM | POA: Diagnosis not present

## 2021-08-02 DIAGNOSIS — E119 Type 2 diabetes mellitus without complications: Secondary | ICD-10-CM | POA: Diagnosis not present

## 2021-08-02 DIAGNOSIS — K219 Gastro-esophageal reflux disease without esophagitis: Secondary | ICD-10-CM | POA: Diagnosis not present

## 2021-08-02 DIAGNOSIS — E669 Obesity, unspecified: Secondary | ICD-10-CM | POA: Diagnosis not present

## 2021-08-02 DIAGNOSIS — N529 Male erectile dysfunction, unspecified: Secondary | ICD-10-CM | POA: Diagnosis not present

## 2021-08-02 DIAGNOSIS — I4892 Unspecified atrial flutter: Secondary | ICD-10-CM | POA: Diagnosis not present

## 2021-08-02 DIAGNOSIS — Z6831 Body mass index (BMI) 31.0-31.9, adult: Secondary | ICD-10-CM | POA: Diagnosis not present

## 2021-08-02 DIAGNOSIS — N400 Enlarged prostate without lower urinary tract symptoms: Secondary | ICD-10-CM | POA: Diagnosis not present

## 2021-08-03 ENCOUNTER — Other Ambulatory Visit: Payer: Self-pay

## 2021-08-03 MED ORDER — ATORVASTATIN CALCIUM 10 MG PO TABS: 10.0000 mg | ORAL_TABLET | Freq: Every day | ORAL | 0 refills | Status: DC

## 2021-08-03 NOTE — Progress Notes (Signed)
Ref [id number], Henry Martin

## 2021-08-03 NOTE — Telephone Encounter (Signed)
Please call pt for OV due for medication management/future refills  Thank you.

## 2021-08-08 NOTE — Telephone Encounter (Signed)
Pt scheduled for 6/13 for f/u and to discuss meds.

## 2021-08-10 ENCOUNTER — Telehealth: Payer: Self-pay

## 2021-08-16 ENCOUNTER — Ambulatory Visit (INDEPENDENT_AMBULATORY_CARE_PROVIDER_SITE_OTHER): Payer: Medicare HMO | Admitting: Family Medicine

## 2021-08-16 ENCOUNTER — Encounter: Payer: Self-pay | Admitting: Family Medicine

## 2021-08-16 VITALS — BP 136/68 | HR 70 | Ht 72.0 in | Wt 231.0 lb

## 2021-08-16 DIAGNOSIS — E1169 Type 2 diabetes mellitus with other specified complication: Secondary | ICD-10-CM

## 2021-08-16 DIAGNOSIS — I5189 Other ill-defined heart diseases: Secondary | ICD-10-CM | POA: Diagnosis not present
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
## 2021-09-02 ENCOUNTER — Ambulatory Visit (INDEPENDENT_AMBULATORY_CARE_PROVIDER_SITE_OTHER): Payer: Medicare HMO | Admitting: *Deleted

## 2021-09-02 DIAGNOSIS — I480 Paroxysmal atrial fibrillation: Secondary | ICD-10-CM

## 2021-09-02 DIAGNOSIS — I10 Essential (primary) hypertension: Secondary | ICD-10-CM

## 2021-09-02 NOTE — Patient Instructions (Signed)
Visit Information  Thank you for taking time to visit with me today. Please don't hesitate to contact me if I can be of assistance to you before our next scheduled telephone appointment.  Following are the goals we discussed today:  Take medications as prescribed   Attend all scheduled provider appointments Perform all self care activities independently  Perform IADL's (shopping, preparing meals, housekeeping, managing finances) independently Call provider office for new concerns or questions  check pulse (heart) rate once a day make a plan to exercise regularly make a plan to eat healthy keep all lab appointments take medicine as prescribed check blood pressure weekly choose a place to take my blood pressure (home, clinic or office, retail store) write blood pressure results in a log or diary keep a blood pressure log take blood pressure log to all doctor appointments call doctor for signs and symptoms of high blood pressure take medications for blood pressure exactly as prescribed report new symptoms to your doctor eat more whole grains, fruits and vegetables, lean meats and healthy fats Take care of yourself, get plenty of rest, try to relax when you can Continue getting outdoors daily if possible Elevate legs when sitting Follow a low sodium diet- read labels, limit fast food Case closure today- please let your doctor know if you have any needs in the future for care management  Please call the care guide team at [phone] if you need to cancel or reschedule your appointment.   If you are experiencing a Mental Health or [place] or need someone to talk to, please call the Suicide and Crisis Lifeline: 988 call the [place] National Suicide Prevention Lifeline: (608) 259-4661 or TTY: 508 446 6501 TTY 432 821 0687) to talk to a trained counselor call 1-800-273-TALK (toll free, 24 hour hotline) go to [facility] [name],  [place] [id number]) call 911   The patient verbalized understanding of instructions, educational materials, and care plan provided today and DECLINED offer to receive copy of patient instructions, educational materials, and care plan.   Jacqlyn Larsen RNC, BSN RN Case Manager [facility] [phone]

## 2021-09-02 NOTE — Chronic Care Management (AMB) (Signed)
Chronic Care Management   CCM RN Visit Note  09/02/2021 Name: Henry Martin MRN: [id number] DOB: Nov 08, 1940  Subjective: Henry Martin is a 81 y.o. year old male who is a primary care patient of Pickard, Cammie Mcgee, MD. The care management team was consulted for assistance with disease management and care coordination needs.    Engaged with patient by telephone for follow up visit in response to provider referral for case management and/or care coordination services.   Consent to Services:  The patient was given information about Chronic Care Management services, agreed to services, and gave verbal consent prior to initiation of services.  Please see initial visit note for detailed documentation.   Patient agreed to services and verbal consent obtained.   Assessment: Review of patient past medical history, allergies, medications, health status, including review of consultants reports, laboratory and other test data, was performed as part of comprehensive evaluation and provision of chronic care management services.   SDOH (Social Determinants of Health) assessments and interventions performed:    CCM Care Plan  Allergies  Allergen Reactions   Contrast Media [Iodinated Contrast Media] Other (See Comments)    "Isovue"; "blisters; all down my legs"   Metrizamide Other (See Comments)    "Isovue"; "blisters; all down my legs"    Outpatient Encounter Medications as of 09/02/2021  Medication Sig Note   atorvastatin (LIPITOR) 10 MG tablet Take 1 tablet (10 mg total) by mouth daily.    Cholecalciferol (VITAMIN D3) 1000 UNITS CAPS Take 1,000 Units by mouth daily.     Fish Oil OIL Take 800 mg by mouth 2 (two) times daily.     hydrochlorothiazide (HYDRODIURIL) 25 MG tablet TAKE 1 TABLET BY MOUTH ONCE A DAY    metFORMIN (GLUCOPHAGE) 500 MG tablet TAKE 2 TABLETS BY MOUTH  DAILY WITH A MEAL 07/22/2021: TAKING One tablet BID   metoprolol succinate (TOPROL-XL) 25 MG 24 hr tablet TAKE 1/2 TABLET AT  BEDTIME    olmesartan (BENICAR) 40 MG tablet TAKE 1 TABLET DAILY.    tamsulosin (FLOMAX) 0.4 MG CAPS capsule Take 1 capsule (0.4 mg total) by mouth daily.    Vibegron (GEMTESA) 75 MG TABS Take 1 tablet by mouth. Samples from Urologist    XARELTO 20 MG TABS tablet TAKE 1 TABLET BY MOUTH ONCE DAILY    Blood Glucose Monitoring Suppl (ACCU-CHEK AVIVA PLUS) w/Device KIT Use to check BS QAM (Patient not taking: Reported on 09/02/2021)    glucose blood (ACCU-CHEK AVIVA PLUS) test strip Check BC QAM DX: e11.9 (Patient not taking: Reported on 09/02/2021)    Lancets (ACCU-CHEK MULTICLIX) lancets Check BC QAM DX: e11.9 (Patient not taking: Reported on 09/02/2021)    Lancets Misc. (ACCU-CHEK MULTICLIX LANCET DEV) KIT Check BC QAM DX: e11.9 (Patient not taking: Reported on 09/02/2021)    No facility-administered encounter medications on file as of 09/02/2021.    Patient Active Problem List   Diagnosis Date Noted   Pain in right knee 11/16/2017   OA (osteoarthritis) of knee 11/14/2017   Chronic anticoagulation 02/01/2016   History of stroke 02/01/2016   Sleep apnea 02/01/2016   Personal history of colonic polyps 12/26/2012   Diarrhea 07/05/2011   PAF (paroxysmal atrial fibrillation) ([place]) 07/04/2011   HLD (hyperlipidemia) 03/23/2008   Essential hypertension 03/23/2008   CAD (coronary artery disease) 03/23/2008    Conditions to be addressed/monitored:Atrial Fibrillation and HTN  Care Plan : RN Care Manager Plan of Care  Updates made by Kassie Mends, RN  since 09/02/2021 12:00 AM  Completed 09/02/2021   Problem: No plan of care established for management of chronic disease states (HTN, Atrial fibrillation) Resolved 09/02/2021  Priority: High     Long-Range Goal: Development of plan of care for chronic disease management (HTN, Atrial Fibrillation) Completed 09/02/2021  Start Date: 03/25/2021  Expected End Date: 09/21/2021  Priority: High  Note:   Current Barriers:  Knowledge Deficits related to  plan of care for management of Atrial Fibrillation and HTN  Ineffective Self Health Maintenance in a patient with Atrial Fibrillation-  pt lives with spouse (has dementia and is now on hospice at home)  has assistance in the home for her, daughter has moved here to assist with her mother, pt is very busy caring for his wife.  Pt feels he is managing " pretty well" with health conditions at present, has medications and taking as prescribed, continues xarelto. Knowledge Deficits related to basic understanding of hypertension pathophysiology and self care management-  verbalizes understanding of low sodium diet, importance of keeping blood pressure managed for overall health, pt reports he checks BP 1 x week and "readings are good" Eats fast food " sometimes', is part time [employer] and stays busy, gets out for church and other social activities at times,  Reports does not check CBG and has not been instructed to do so.  Most recent AIC per pt 6.0. Patient reports he had echocardiogram on 05/04/21, pt reports this was ordered due to having leg swelling and dyspnea at times, patient reports " my legs swell up sometimes but not like this all the time", no change in swelling, no new concerns reported today.  RNCM Clinical Goal(s):  Patient will verbalize understanding of plan for management of Atrial Fibrillation and HTN as evidenced by patient report, review of EHR and  through collaboration with RN Care manager, provider, and care team.   Interventions: 1:1 collaboration with primary care provider regarding development and update of comprehensive plan of care as evidenced by provider attestation and co-signature Inter-disciplinary care team collaboration (see longitudinal plan of care) Evaluation of current treatment plan related to  self management and patient's adherence to plan as established by provider   AFIB Interventions: (Status:  Goal on track:  Yes. and Goal Met.) Long Term Goal   Counseled on  increased risk of stroke due to Afib and benefits of anticoagulation for stroke prevention Reviewed importance of adherence to anticoagulant exactly as prescribed Counseled on importance of regular laboratory monitoring as prescribed Afib action plan reviewed Reviewed importance of staying active and getting outside daily, weather permitting Reinforced adequate rest and relaxation to avoid caregiver burnout Pain assessment completed  Hypertension Interventions:  (Status:  Goal Met.) Long Term Goal Last practice recorded BP readings:  BP Readings from Last 3 Encounters:  10/22/20 (!) 144/82  07/15/20 (!) 160/70  10/30/19 130/78  Most recent eGFR/CrCl:  Lab Results  Component Value Date   EGFR 53 (L) 10/15/2020    No components found for: CRCL  Evaluation of current treatment plan related to hypertension self management and patient's adherence to plan as established by provider Reviewed medications with patient and discussed importance of compliance Counseled on the importance of exercise goals with target of 150 minutes per week Advised patient, providing education and rationale, to monitor blood pressure daily and record, calling PCP for findings outside established parameters Discussed complications of poorly controlled blood pressure such as heart disease, stroke, circulatory complications, vision complications, kidney impairment, sexual dysfunction  Reinforced  low sodium diet Reinforced elevating legs when sitting  Patient Goals/Self-Care Activities: Take medications as prescribed   Attend all scheduled provider appointments Perform all self care activities independently  Perform IADL's (shopping, preparing meals, housekeeping, managing finances) independently Call provider office for new concerns or questions  - check pulse (heart) rate once a day - make a plan to exercise regularly - make a plan to eat healthy - keep all lab appointments - take medicine as prescribed check  blood pressure weekly choose a place to take my blood pressure (home, clinic or office, retail store) write blood pressure results in a log or diary keep a blood pressure log take blood pressure log to all doctor appointments call doctor for signs and symptoms of high blood pressure take medications for blood pressure exactly as prescribed report new symptoms to your doctor eat more whole grains, fruits and vegetables, lean meats and healthy fats Take care of yourself, get plenty of rest, try to relax when you can Continue getting outdoors daily if possible Elevate legs when sitting Follow a low sodium diet- read labels, limit fast food Case closure today- please let your doctor know if you have any needs in the future for care management       Plan:No further follow up required: case closure   Jacqlyn Larsen [facility], BSN RN Case Manager [place] [phone]

## 2021-09-13 ENCOUNTER — Ambulatory Visit: Payer: Medicare HMO | Admitting: Urology

## 2021-09-13 VITALS — BP 119/59 | HR 72

## 2021-09-13 DIAGNOSIS — R350 Frequency of micturition: Secondary | ICD-10-CM | POA: Diagnosis not present

## 2021-09-13 DIAGNOSIS — N138 Other obstructive and reflux uropathy: Secondary | ICD-10-CM

## 2021-09-13 DIAGNOSIS — N401 Enlarged prostate with lower urinary tract symptoms: Secondary | ICD-10-CM | POA: Diagnosis not present

## 2021-09-13 DIAGNOSIS — R351 Nocturia: Secondary | ICD-10-CM | POA: Diagnosis not present

## 2021-09-13 LAB — URINALYSIS, ROUTINE W REFLEX MICROSCOPIC
Bilirubin, UA: NEGATIVE
Glucose, UA: NEGATIVE
Ketones, UA: NEGATIVE
Leukocytes,UA: NEGATIVE
Nitrite, UA: NEGATIVE
Protein,UA: NEGATIVE
RBC, UA: NEGATIVE
Specific Gravity, UA: 1.015 (ref 1.005–1.030)
Urobilinogen, Ur: 4 mg/dL — ABNORMAL HIGH (ref 0.2–1.0)
pH, UA: 6.5 (ref 5.0–7.5)

## 2021-09-13 MED ORDER — TAMSULOSIN HCL 0.4 MG PO CAPS: ORAL_CAPSULE | ORAL | 3 refills | Status: DC
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
## 2021-10-03 ENCOUNTER — Other Ambulatory Visit: Payer: Self-pay | Admitting: Family Medicine

## 2021-10-04 NOTE — Telephone Encounter (Signed)
Requested Prescriptions  Pending Prescriptions Disp Refills  . atorvastatin (LIPITOR) 10 MG tablet [Pharmacy Med Name: ATORVASTATIN TAB '10MG'$ ] 90 tablet 3    Sig: TAKE 1 TABLET DAILY     Cardiovascular:  Antilipid - Statins Failed - 10/03/2021  2:23 PM      Failed - Lipid Panel in normal range within the last 12 months    Cholesterol  Date Value Ref Range Status  10/15/2020 102 <200 mg/dL Final   LDL Cholesterol (Calc)  Date Value Ref Range Status  10/15/2020 48 mg/dL (calc) Final    Comment:    Reference range: <100 . Desirable range <100 mg/dL for primary prevention;   <70 mg/dL for patients with CHD or diabetic patients  with > or = 2 CHD risk factors. Marland Kitchen LDL-C is now calculated using the Martin-Hopkins  calculation, which is a validated novel method providing  better accuracy than the Friedewald equation in the  estimation of LDL-C.  Cresenciano Genre et al. Annamaria Helling. 8099;833(82): 2061-2068  (http://education.[facility].com/faq/FAQ164)    HDL  Date Value Ref Range Status  10/15/2020 38 (L) > OR = 40 mg/dL Final   Triglycerides  Date Value Ref Range Status  10/15/2020 81 <150 mg/dL Final         Passed - Patient is not pregnant      Passed - Valid encounter within last 12 months    Recent Outpatient Visits          3 months ago Essential hypertension   [place], Cammie Mcgee, MD   5 months ago Right flank pain   [place] Dennard Schaumann, Cammie Mcgee, MD   11 months ago Encounter for [employer] annual wellness exam   [place] Susy Frizzle, MD   1 year ago Diabetes mellitus type 2, uncontrolled, with complications ([place])   [place] Susy Frizzle, MD   3 years ago Diabetes mellitus type 2, uncontrolled, with complications ([place])   [place] Pickard, Cammie Mcgee, MD      Future Appointments            In 11 months Franchot Gallo, MD [place]

## 2021-10-12 DIAGNOSIS — Z85828 Personal history of other malignant neoplasm of skin: Secondary | ICD-10-CM | POA: Diagnosis not present

## 2021-10-12 DIAGNOSIS — L570 Actinic keratosis: Secondary | ICD-10-CM | POA: Diagnosis not present

## 2021-10-12 DIAGNOSIS — L821 Other seborrheic keratosis: Secondary | ICD-10-CM | POA: Diagnosis not present

## 2021-10-12 DIAGNOSIS — L814 Other melanin hyperpigmentation: Secondary | ICD-10-CM | POA: Diagnosis not present

## 2021-10-14 ENCOUNTER — Ambulatory Visit (INDEPENDENT_AMBULATORY_CARE_PROVIDER_SITE_OTHER): Payer: Medicare HMO | Admitting: Family Medicine

## 2021-10-14 VITALS — BP 132/78 | HR 74 | Temp 97.7°F | Ht 73.0 in | Wt 229.0 lb

## 2021-10-14 DIAGNOSIS — N390 Urinary tract infection, site not specified: Secondary | ICD-10-CM | POA: Diagnosis not present

## 2021-10-14 DIAGNOSIS — S39012A Strain of muscle, fascia and tendon of lower back, initial encounter: Secondary | ICD-10-CM

## 2021-10-14 LAB — URINALYSIS, ROUTINE W REFLEX MICROSCOPIC
Bacteria, UA: NONE SEEN /HPF
Bilirubin Urine: NEGATIVE
Glucose, UA: NEGATIVE
Hyaline Cast: NONE SEEN /LPF
Ketones, ur: NEGATIVE
Leukocytes,Ua: NEGATIVE
Nitrite: NEGATIVE
Protein, ur: NEGATIVE
Specific Gravity, Urine: 1.02 (ref 1.001–1.035)
Squamous Epithelial / HPF: NONE SEEN /HPF (ref <=5)
WBC, UA: NONE SEEN /HPF (ref 0–5)
pH: 5.5 (ref 5.0–8.0)

## 2021-10-14 LAB — MICROSCOPIC MESSAGE

## 2021-10-14 MED ORDER — CYCLOBENZAPRINE HCL 10 MG PO TABS: 10.0000 mg | ORAL_TABLET | Freq: Three times a day (TID) | ORAL | 0 refills | Status: AC | PRN

## 2021-10-14 NOTE — Addendum Note (Signed)
Addended by: Colman Cater on: 10/14/2021 01:06 PM   Modules accepted: Orders
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
## 2021-12-20 ENCOUNTER — Other Ambulatory Visit: Payer: Self-pay | Admitting: Family Medicine

## 2021-12-20 NOTE — Telephone Encounter (Signed)
Requested medication (s) are due for refill today: no  Requested medication (s) are on the active medication list: yes  Last refill:  10/04/21 #90  Future visit scheduled: no  Notes to clinic:  overdue lab work   Requested Prescriptions  Pending Prescriptions Disp Refills   atorvastatin (LIPITOR) 10 MG tablet [Pharmacy Med Name: ATORVASTATIN TAB '10MG'$ ] 90 tablet 0    Sig: TAKE 1 TABLET DAILY     Cardiovascular:  Antilipid - Statins Failed - 12/20/2021  2:12 AM      Failed - Lipid Panel in normal range within the last 12 months    Cholesterol  Date Value Ref Range Status  10/15/2020 102 <200 mg/dL Final   LDL Cholesterol (Calc)  Date Value Ref Range Status  10/15/2020 48 mg/dL (calc) Final    Comment:    Reference range: <100 . Desirable range <100 mg/dL for primary prevention;   <70 mg/dL for patients with CHD or diabetic patients  with > or = 2 CHD risk factors. Marland Kitchen LDL-C is now calculated using the Martin-Hopkins  calculation, which is a validated novel method providing  better accuracy than the Friedewald equation in the  estimation of LDL-C.  Cresenciano Genre et al. Annamaria Helling. 8403;754(36): 2061-2068  (http://education.[facility].com/faq/FAQ164)    HDL  Date Value Ref Range Status  10/15/2020 38 (L) > OR = 40 mg/dL Final   Triglycerides  Date Value Ref Range Status  10/15/2020 81 <150 mg/dL Final         Passed - Patient is not pregnant      Passed - Valid encounter within last 12 months    Recent Outpatient Visits           6 months ago Essential hypertension   [place], Cammie Mcgee, MD   7 months ago Right flank pain   [place] [facility] Pick[facility], Cammie Mcgee, MD   1 year ago Encounter for Medicare annual wellness exam   [place] Susy Frizzle, MD   2 years ago Diabetes mellitus type 2, uncontrolled, with complications ([place])   [place] Susy Frizzle, MD   3 years ago Diabetes  mellitus type 2, uncontrolled, with complications ([place])   [place] Pickard, Cammie Mcgee, MD       Future Appointments             In 9 months Franchot Gallo, MD [facility] [place]

## 2022-01-10 ENCOUNTER — Other Ambulatory Visit: Payer: Self-pay | Admitting: Family Medicine

## 2022-02-01 ENCOUNTER — Other Ambulatory Visit: Payer: Self-pay | Admitting: Family Medicine

## 2022-02-01 NOTE — Telephone Encounter (Signed)
Requested medication (s) are due for refill today: -  Requested medication (s) are on the active medication list: no  Last refill:  10/22/20 #4 4 RF  Future visit scheduled: no  Notes to clinic:  Prescription ended 04/26/21   Requested Prescriptions  Pending Prescriptions Disp Refills   amoxicillin (AMOXIL) 500 MG capsule [Pharmacy Med Name: AMOXICILLIN 500 MG CAPSULE] 4 capsule 4    Sig: TAKE 4 CAPSULES BY MOUTH ONCE FOR 1 DOSE PRIOR TO DENTAL WORK     Off-Protocol Failed - 02/01/2022 10:21 AM      Failed - Medication not assigned to a protocol, review manually.      Passed - Valid encounter within last 12 months    Recent Outpatient Visits           7 months ago Essential hypertension   [place], Cammie Mcgee, MD   9 months ago Right flank pain   [place] [facility] Scha[facility], Cammie Mcgee, MD   1 year ago Encounter for Medicare annual wellness exam   [place] Susy Frizzle, MD   2 years ago Diabetes mellitus type 2, uncontrolled, with complications ([place])   [place] Susy Frizzle, MD   3 years ago Diabetes mellitus type 2, uncontrolled, with complications ([place])   [place] Pickard, Cammie Mcgee, MD       Future Appointments             In 7 months Franchot Gallo, MD [facility] [place]

## 2022-02-07 ENCOUNTER — Telehealth: Payer: Self-pay

## 2022-02-07 ENCOUNTER — Other Ambulatory Visit: Payer: Self-pay | Admitting: Family Medicine

## 2022-02-07 MED ORDER — AMOXICILLIN 500 MG PO CAPS: 2000.0000 mg | ORAL_CAPSULE | Freq: Every day | ORAL | 0 refills | Status: AC | PRN

## 2022-02-07 NOTE — Telephone Encounter (Signed)
Pt came into office asking to get a refill of amoxicillin (AMOXIL) 500 MG capsule [[id number]]  ENDED. Pt stated that he has some upcoming dental work and will need these med prior to dentist appt. Please advise.  Cb#: [phone]

## 2022-02-26 ENCOUNTER — Other Ambulatory Visit: Payer: Self-pay | Admitting: Family Medicine

## 2022-03-13 ENCOUNTER — Other Ambulatory Visit: Payer: Self-pay | Admitting: Family Medicine

## 2022-03-24 DIAGNOSIS — H524 Presbyopia: Secondary | ICD-10-CM | POA: Diagnosis not present

## 2022-03-24 DIAGNOSIS — Z961 Presence of intraocular lens: Secondary | ICD-10-CM | POA: Diagnosis not present

## 2022-04-03 DIAGNOSIS — B338 Other specified viral diseases: Secondary | ICD-10-CM | POA: Diagnosis not present

## 2022-04-25 ENCOUNTER — Other Ambulatory Visit: Payer: Self-pay | Admitting: Family Medicine

## 2022-06-18 ENCOUNTER — Other Ambulatory Visit: Payer: Self-pay | Admitting: Family Medicine

## 2022-06-19 NOTE — Telephone Encounter (Signed)
Requested Prescriptions  Pending Prescriptions Disp Refills   atorvastatin (LIPITOR) 10 MG tablet [Pharmacy Med Name: ATORVASTATIN TAB 10MG ] 90 tablet 0    Sig: TAKE 1 TABLET DAILY     Cardiovascular:  Antilipid - Statins Failed - 06/18/2022  8:11 AM      Failed - Valid encounter within last 12 months    Recent Outpatient Visits           1 year ago Essential hypertension   [facility] [place], Priscille Heidelberg, MD   1 year ago Right flank pain   [facility] Pick[place], Priscille Heidelberg, MD   1 year ago Encounter for Medicare annual wellness exam   [facility] Donita Brooks, MD   2 years ago Diabetes mellitus type 2, uncontrolled, with complications (HCC)   [facility] Donita Brooks, MD   3 years ago Diabetes mellitus type 2, uncontrolled, with complications (HCC)   [facility] Pickard, Priscille Heidelberg, MD       Future Appointments             In 2 months Marcine Matar, MD [facility]             Failed - Lipid Panel in normal range within the last 12 months    Cholesterol  Date Value Ref Range Status  10/15/2020 102 <200 mg/dL Final   LDL Cholesterol (Calc)  Date Value Ref Range Status  10/15/2020 48 mg/dL (calc) Final    Comment:    Reference range: <100 . Desirable range <100 mg/dL for primary prevention;   <70 mg/dL for patients with CHD or diabetic patients  with > or = 2 CHD risk factors. Marland Kitchen LDL-C is now calculated using the Martin-Hopkins  calculation, which is a validated novel method providing  better accuracy than the Friedewald equation in the  estimation of LDL-C.  Horald Pollen et al. Lenox Ahr. 1601;093(23): 2061-2068  (http://education.[facility].com/faq/FAQ164)    HDL  Date Value Ref Range Status  10/15/2020 38 (L) > OR = 40 mg/dL Final   Triglycerides  Date Value Ref Range Status  10/15/2020 81 <150 mg/dL Final         Passed - Patient is not pregnant

## 2022-07-27 ENCOUNTER — Ambulatory Visit (INDEPENDENT_AMBULATORY_CARE_PROVIDER_SITE_OTHER): Payer: Medicare HMO

## 2022-07-27 VITALS — Ht 73.0 in | Wt 229.0 lb

## 2022-07-27 DIAGNOSIS — Z0000 Encounter for general adult medical examination without abnormal findings: Secondary | ICD-10-CM
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
## 2022-07-27 NOTE — Patient Instructions (Addendum)
Henry Martin , Thank you for taking time to come for your Medicare Wellness Visit. I appreciate your ongoing commitment to your health goals. Please review the following plan we discussed and let me know if I can assist you in the future.   These are the goals we discussed:  Goals      Exercise 3x per week (30 min per time)     Weight (lb) < 210 lb (95.3 kg)        This is a list of the screening recommended for you and due dates:  Health Maintenance  Topic Date Due   Yearly kidney health urinalysis for diabetes  10/15/2021   COVID-19 Vaccine (4 - 2023-24 season) 11/04/2021   Yearly kidney function blood test for diabetes  06/18/2022   Medicare Annual Wellness Visit  07/23/2022   Flu Shot  10/05/2022   DTaP/Tdap/Td vaccine (5 - Td or Tdap) 09/11/2028   Pneumonia Vaccine  Completed   Zoster (Shingles) Vaccine  Completed   HPV Vaccine  Aged Out   Colon Cancer Screening  Discontinued    Advanced directives: Information on Advanced Care Planning can be found at [facility] Advance Health Care Directives Advance Health Care Directives ([email or web address] Please bring a copy of your health care power of attorney and living will to the office to be added to your chart at your convenience.   Conditions/risks identified: Aim for 30 minutes of exercise or brisk walking, 6-8 glasses of water, and 5 servings of fruits and vegetables each day.  Next appointment: Follow up in one year for your annual wellness visit.   Preventive Care 76 Years and Older, Male  Preventive care refers to lifestyle choices and visits with your health care provider that can promote health and wellness. What does preventive care include? A yearly physical exam. This is also called an annual well check. Dental exams once or twice a year. Routine eye exams. Ask your health care provider how often you should have your eyes checked. Personal lifestyle choices, including: Daily care of your teeth and  gums. Regular physical activity. Eating a healthy diet. Avoiding tobacco and drug use. Limiting alcohol use. Practicing safe sex. Taking low doses of aspirin every day. Taking vitamin and mineral supplements as recommended by your health care provider. What happens during an annual well check? The services and screenings done by your health care provider during your annual well check will depend on your age, overall health, lifestyle risk factors, and family history of disease. Counseling  Your health care provider may ask you questions about your: Alcohol use. Tobacco use. Drug use. Emotional well-being. Home and relationship well-being. Sexual activity. Eating habits. History of falls. Memory and ability to understand (cognition). Work and work Astronomer. Screening  You may have the following tests or measurements: Height, weight, and BMI. Blood pressure. Lipid and cholesterol levels. These may be checked every 5 years, or more frequently if you are over 81 years old. Skin check. Lung cancer screening. You may have this screening every year starting at age 45 if you have a 30-pack-year history of smoking and currently smoke or have quit within the past 15 years. Fecal occult blood test (FOBT) of the stool. You may have this test every year starting at age 31. Flexible sigmoidoscopy or colonoscopy. You may have a sigmoidoscopy every 5 years or a colonoscopy every 10 years starting at age 55. Prostate cancer screening. Recommendations will vary depending on your family history and other  risks. Hepatitis C blood test. Hepatitis B blood test. Sexually transmitted disease (STD) testing. Diabetes screening. This is done by checking your blood sugar (glucose) after you have not eaten for a while (fasting). You may have this done every 1-3 years. Abdominal aortic aneurysm (AAA) screening. You may need this if you are a current or former smoker. Osteoporosis. You may be screened  starting at age 25 if you are at high risk. Talk with your health care provider about your test results, treatment options, and if necessary, the need for more tests. Vaccines  Your health care provider may recommend certain vaccines, such as: Influenza vaccine. This is recommended every year. Tetanus, diphtheria, and acellular pertussis (Tdap, Td) vaccine. You may need a Td booster every 10 years. Zoster vaccine. You may need this after age 72. Pneumococcal 13-valent conjugate (PCV13) vaccine. One dose is recommended after age 67. Pneumococcal polysaccharide (PPSV23) vaccine. One dose is recommended after age 62. Talk to your health care provider about which screenings and vaccines you need and how often you need them. This information is not intended to replace advice given to you by your health care provider. Make sure you discuss any questions you have with your health care provider. Document Released: 03/19/2015 Document Revised: 11/10/2015 Document Reviewed: 12/22/2014 Elsevier Interactive Patient Education  2017 [employer].  Fall Prevention in the Home Falls can cause injuries. They can happen to people of all ages. There are many things you can do to make your home safe and to help prevent falls. What can I do on the outside of my home? Regularly fix the edges of walkways and driveways and fix any cracks. Remove anything that might make you trip as you walk through a door, such as a raised step or threshold. Trim any bushes or trees on the path to your home. Use bright outdoor lighting. Clear any walking paths of anything that might make someone trip, such as rocks or tools. Regularly check to see if handrails are loose or broken. Make sure that both sides of any steps have handrails. Any raised decks and porches should have guardrails on the edges. Have any leaves, snow, or ice cleared regularly. Use sand or salt on walking paths during winter. Clean up any spills in your garage  right away. This includes oil or grease spills. What can I do in the bathroom? Use night lights. Install grab bars by the toilet and in the tub and shower. Do not use towel bars as grab bars. Use non-skid mats or decals in the tub or shower. If you need to sit down in the shower, use a plastic, non-slip stool. Keep the floor dry. Clean up any water that spills on the floor as soon as it happens. Remove soap buildup in the tub or shower regularly. Attach bath mats securely with double-sided non-slip rug tape. Do not have throw rugs and other things on the floor that can make you trip. What can I do in the bedroom? Use night lights. Make sure that you have a light by your bed that is easy to reach. Do not use any sheets or blankets that are too big for your bed. They should not hang down onto the floor. Have a firm chair that has side arms. You can use this for support while you get dressed. Do not have throw rugs and other things on the floor that can make you trip. What can I do in the kitchen? Clean up any spills right away.  Avoid walking on wet floors. Keep items that you use a lot in easy-to-reach places. If you need to reach something above you, use a strong step stool that has a grab bar. Keep electrical cords out of the way. Do not use floor polish or wax that makes floors slippery. If you must use wax, use non-skid floor wax. Do not have throw rugs and other things on the floor that can make you trip. What can I do with my stairs? Do not leave any items on the stairs. Make sure that there are handrails on both sides of the stairs and use them. Fix handrails that are broken or loose. Make sure that handrails are as long as the stairways. Check any carpeting to make sure that it is firmly attached to the stairs. Fix any carpet that is loose or worn. Avoid having throw rugs at the top or bottom of the stairs. If you do have throw rugs, attach them to the floor with carpet tape. Make  sure that you have a light switch at the top of the stairs and the bottom of the stairs. If you do not have them, ask someone to add them for you. What else can I do to help prevent falls? Wear shoes that: Do not have high heels. Have rubber bottoms. Are comfortable and fit you well. Are closed at the toe. Do not wear sandals. If you use a stepladder: Make sure that it is fully opened. Do not climb a closed stepladder. Make sure that both sides of the stepladder are locked into place. Ask someone to hold it for you, if possible. Clearly mark and make sure that you can see: Any grab bars or handrails. First and last steps. Where the edge of each step is. Use tools that help you move around (mobility aids) if they are needed. These include: Canes. Walkers. Scooters. Crutches. Turn on the lights when you go into a dark area. Replace any light bulbs as soon as they burn out. Set up your furniture so you have a clear path. Avoid moving your furniture around. If any of your floors are uneven, fix them. If there are any pets around you, be aware of where they are. Review your medicines with your doctor. Some medicines can make you feel dizzy. This can increase your chance of falling. Ask your doctor what other things that you can do to help prevent falls. This information is not intended to replace advice given to you by your health care provider. Make sure you discuss any questions you have with your health care provider. Document Released: 12/17/2008 Document Revised: 07/29/2015 Document Reviewed: 03/27/2014 Elsevier Interactive Patient Education  2017 [employer].

## 2022-08-01 ENCOUNTER — Other Ambulatory Visit: Payer: Self-pay | Admitting: Family Medicine

## 2022-08-25 ENCOUNTER — Other Ambulatory Visit: Payer: Self-pay | Admitting: Family Medicine

## 2022-08-25 ENCOUNTER — Other Ambulatory Visit: Payer: Medicare HMO

## 2022-08-25 DIAGNOSIS — E7800 Pure hypercholesterolemia, unspecified: Secondary | ICD-10-CM

## 2022-08-25 DIAGNOSIS — I480 Paroxysmal atrial fibrillation: Secondary | ICD-10-CM

## 2022-08-25 DIAGNOSIS — I2510 Atherosclerotic heart disease of native coronary artery without angina pectoris: Secondary | ICD-10-CM

## 2022-08-25 DIAGNOSIS — E1169 Type 2 diabetes mellitus with other specified complication: Secondary | ICD-10-CM

## 2022-08-25 DIAGNOSIS — I10 Essential (primary) hypertension: Secondary | ICD-10-CM

## 2022-08-25 NOTE — Telephone Encounter (Signed)
Courtesy refill. Requested Prescriptions  Pending Prescriptions Disp Refills   metFORMIN (GLUCOPHAGE) 500 MG tablet [Pharmacy Med Name: METFORMIN HYDROCHLORIDE 500MG  TABLET] 30 tablet 0    Sig: TAKE TWO TABLETS BY MOUTH DAILY WITH A MEAL. PLEASE SET UP OFFICE VISIT WITH DOCTOR BEFORE FUTURE REFILLS. THANKS     Endocrinology:  Diabetes - Biguanides Failed - 08/25/2022  2:16 PM      Failed - Cr in normal range and within 360 days    Creat  Date Value Ref Range Status  06/17/2021 1.31 (H) 0.70 - 1.22 mg/dL Final   Creatinine, Urine  Date Value Ref Range Status  10/15/2020 156 20 - 320 mg/dL Final         Failed - HBA1C is between 0 and 7.9 and within 180 days    Hgb A1C (fingerstick)  Date Value Ref Range Status  09/10/2018 6.0 (H) <6.0 % OF TOTAL HGB Final   Hgb A1c MFr Bld  Date Value Ref Range Status  06/17/2021 6.4 (H) <5.7 % of total Hgb Final    Comment:    For someone without known diabetes, a hemoglobin  A1c value between 5.7% and 6.4% is consistent with prediabetes and should be confirmed with a  follow-up test. . For someone with known diabetes, a value <7% indicates that their diabetes is well controlled. A1c targets should be individualized based on duration of diabetes, age, comorbid conditions, and other considerations. . This assay result is consistent with an increased risk of diabetes. . Currently, no consensus exists regarding use of hemoglobin A1c for diagnosis of diabetes for children. .          Failed - eGFR in normal range and within 360 days    GFR, Est African American  Date Value Ref Range Status  10/27/2019 71 > OR = 60 mL/min/1.34m2 Final   GFR, Est Non African American  Date Value Ref Range Status  10/27/2019 61 > OR = 60 mL/min/1.65m2 Final   GFR  Date Value Ref Range Status  09/26/2011 65.44 >60.00 mL/min Final   eGFR  Date Value Ref Range Status  06/17/2021 55 (L) > OR = 60 mL/min/1.53m2 Final    Comment:    The eGFR is based  on the CKD-EPI 2021 equation. To calculate  the new eGFR from a previous Creatinine or Cystatin C result, go to https://www.kidney.org/professionals/ kdoqi/gfr%5Fcalculator          Failed - B12 Level in normal range and within 720 days    No results found for: "VITAMINB12"       Failed - Valid encounter within last 6 months    Recent Outpatient Visits           1 year ago Essential hypertension   [facility] Tanya Nones, Priscille Heidelberg, MD   1 year ago Right flank pain   [facility] Tanya Nones, Priscille Heidelberg, MD   1 year ago Encounter for Medicare annual wellness exam   [facility] Donita Brooks, MD   2 years ago Diabetes mellitus type 2, uncontrolled, with complications (HCC)   [facility] Donita Brooks, MD   3 years ago Diabetes mellitus type 2, uncontrolled, with complications (HCC)   [facility] Pickard, Priscille Heidelberg, MD       Future Appointments             In 4 days Marcine Matar, MD [facility] [place]   In 1  week Donita Brooks, MD [place] [facility]            Failed - CBC within normal limits and completed in the last 12 months    WBC  Date Value Ref Range Status  06/17/2021 8.8 3.8 - 10.8 Thousand/uL Final   RBC  Date Value Ref Range Status  06/17/2021 4.68 4.20 - 5.80 Million/uL Final   Hemoglobin  Date Value Ref Range Status  06/17/2021 13.4 13.2 - 17.1 g/dL Final   HCT  Date Value Ref Range Status  06/17/2021 41.0 38.5 - 50.0 % Final   MCHC  Date Value Ref Range Status  06/17/2021 32.7 32.0 - 36.0 g/dL Final   [facility]  Date Value Ref Range Status  06/17/2021 28.6 27.0 - 33.0 pg Final   MCV  Date Value Ref Range Status  06/17/2021 87.6 80.0 - 100.0 fL Final   No results found for: "PLTCOUNTKUC", "LABPLAT", "POCPLA" RDW  Date Value Ref Range Status  06/17/2021 12.5 11.0 - 15.0 % Final

## 2022-08-29 ENCOUNTER — Encounter: Payer: Self-pay | Admitting: Urology

## 2022-08-29 ENCOUNTER — Ambulatory Visit: Payer: Medicare HMO | Admitting: Urology

## 2022-08-29 VITALS — BP 112/65 | HR 86

## 2022-08-29 DIAGNOSIS — R350 Frequency of micturition: Secondary | ICD-10-CM | POA: Diagnosis not present

## 2022-08-29 DIAGNOSIS — Z85528 Personal history of other malignant neoplasm of kidney: Secondary | ICD-10-CM

## 2022-08-29 DIAGNOSIS — R351 Nocturia: Secondary | ICD-10-CM

## 2022-08-29 DIAGNOSIS — N401 Enlarged prostate with lower urinary tract symptoms: Secondary | ICD-10-CM

## 2022-08-29 DIAGNOSIS — N138 Other obstructive and reflux uropathy: Secondary | ICD-10-CM | POA: Diagnosis not present

## 2022-08-29 DIAGNOSIS — N5201 Erectile dysfunction due to arterial insufficiency: Secondary | ICD-10-CM | POA: Diagnosis not present

## 2022-08-29 LAB — URINALYSIS, ROUTINE W REFLEX MICROSCOPIC
Bilirubin, UA: NEGATIVE
Glucose, UA: NEGATIVE
Leukocytes,UA: NEGATIVE
Nitrite, UA: NEGATIVE
Protein,UA: NEGATIVE
RBC, UA: NEGATIVE
Specific Gravity, UA: 1.025 (ref 1.005–1.030)
Urobilinogen, Ur: 1 mg/dL (ref 0.2–1.0)
pH, UA: 5.5 (ref 5.0–7.5)
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
## 2022-08-31 ENCOUNTER — Other Ambulatory Visit: Payer: Medicare HMO

## 2022-09-04 ENCOUNTER — Ambulatory Visit (INDEPENDENT_AMBULATORY_CARE_PROVIDER_SITE_OTHER): Payer: Medicare HMO | Admitting: Family Medicine

## 2022-09-04 VITALS — BP 110/60 | HR 73 | Temp 97.5°F | Ht 70.5 in | Wt 231.0 lb

## 2022-09-04 DIAGNOSIS — Z0001 Encounter for general adult medical examination with abnormal findings: Secondary | ICD-10-CM | POA: Diagnosis not present

## 2022-09-04 DIAGNOSIS — I480 Paroxysmal atrial fibrillation: Secondary | ICD-10-CM

## 2022-09-04 DIAGNOSIS — E1169 Type 2 diabetes mellitus with other specified complication: Secondary | ICD-10-CM | POA: Diagnosis not present

## 2022-09-04 DIAGNOSIS — E7800 Pure hypercholesterolemia, unspecified: Secondary | ICD-10-CM | POA: Diagnosis not present

## 2022-09-04 DIAGNOSIS — Z0000 Encounter for general adult medical examination without abnormal findings: Secondary | ICD-10-CM

## 2022-09-04 DIAGNOSIS — Z7901 Long term (current) use of anticoagulants: Secondary | ICD-10-CM

## 2022-09-04 DIAGNOSIS — Z7984 Long term (current) use of oral hypoglycemic drugs: Secondary | ICD-10-CM | POA: Diagnosis not present

## 2022-09-04 DIAGNOSIS — I2510 Atherosclerotic heart disease of native coronary artery without angina pectoris: Secondary | ICD-10-CM

## 2022-09-04 DIAGNOSIS — I10 Essential (primary) hypertension: Secondary | ICD-10-CM | POA: Diagnosis not present
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
## 2022-09-05 LAB — COMPLETE METABOLIC PANEL WITH GFR
AG Ratio: 1.5 (calc) (ref 1.0–2.5)
ALT: 14 U/L (ref 9–46)
AST: 11 U/L (ref 10–35)
Albumin: 4 g/dL (ref 3.6–5.1)
Alkaline phosphatase (APISO): 42 U/L (ref 35–144)
BUN: 17 mg/dL (ref 7–25)
CO2: 32 mmol/L (ref 20–32)
Calcium: 10.1 mg/dL (ref 8.6–10.3)
Chloride: 106 mmol/L (ref 98–110)
Creat: 1.17 mg/dL (ref 0.70–1.22)
Globulin: 2.7 g/dL (calc) (ref 1.9–3.7)
Glucose, Bld: 125 mg/dL — ABNORMAL HIGH (ref 65–99)
Potassium: 4.5 mmol/L (ref 3.5–5.3)
Sodium: 140 mmol/L (ref 135–146)
Total Bilirubin: 0.6 mg/dL (ref 0.2–1.2)
Total Protein: 6.7 g/dL (ref 6.1–8.1)
eGFR: 63 mL/min/{1.73_m2} (ref >=60)

## 2022-09-05 LAB — CBC WITH DIFFERENTIAL/PLATELET
Absolute Monocytes: 593 cells/uL (ref 200–950)
Basophils Absolute: 87 cells/uL (ref 0–200)
Basophils Relative: 1.1 %
Eosinophils Absolute: 261 cells/uL (ref 15–500)
Eosinophils Relative: 3.3 %
HCT: 40 % (ref 38.5–50.0)
Hemoglobin: 13.1 g/dL — ABNORMAL LOW (ref 13.2–17.1)
Lymphs Abs: 2417 cells/uL (ref 850–3900)
MCH: 28.9 pg (ref 27.0–33.0)
MCHC: 32.8 g/dL (ref 32.0–36.0)
MCV: 88.3 fL (ref 80.0–100.0)
MPV: 11.9 fL (ref 7.5–12.5)
Monocytes Relative: 7.5 %
Neutro Abs: 4543 cells/uL (ref 1500–7800)
Neutrophils Relative %: 57.5 %
Platelets: 196 10*3/uL (ref 140–400)
RBC: 4.53 10*6/uL (ref 4.20–5.80)
RDW: 12.5 % (ref 11.0–15.0)
Total Lymphocyte: 30.6 %
WBC: 7.9 10*3/uL (ref 3.8–10.8)

## 2022-09-05 LAB — PROTEIN / CREATININE RATIO, URINE
Creatinine, Urine: 156 mg/dL (ref 20–320)
Protein/Creat Ratio: 96 mg/g creat (ref 25–148)
Protein/Creatinine Ratio: 0.096 mg/mg creat (ref 0.025–0.148)
Total Protein, Urine: 15 mg/dL (ref 5–25)

## 2022-09-05 LAB — LIPID PANEL
Cholesterol: 127 mg/dL (ref <200)
HDL: 51 mg/dL (ref >=40)
LDL Cholesterol (Calc): 60 mg/dL (calc)
Non-HDL Cholesterol (Calc): 76 mg/dL (calc) (ref <130)
Total CHOL/HDL Ratio: 2.5 (calc) (ref <5.0)
Triglycerides: 81 mg/dL (ref <150)

## 2022-09-05 LAB — HEMOGLOBIN A1C
Hgb A1c MFr Bld: 6.8 % of total Hgb — ABNORMAL HIGH (ref <5.7)
Mean Plasma Glucose: 148 mg/dL
eAG (mmol/L): 8.2 mmol/L

## 2022-09-06 ENCOUNTER — Telehealth: Payer: Self-pay

## 2022-09-06 NOTE — Telephone Encounter (Signed)
Pt called in to let pcp know that he was going to try the compression hose first, he has already started to wear them. Pt also stated that he would let pcp when and if he would want to try the fluid pills. Please advise.  Cb#: [phone]

## 2022-09-06 NOTE — Progress Notes (Signed)
7/3 LVM

## 2022-09-08 ENCOUNTER — Other Ambulatory Visit: Payer: Self-pay | Admitting: Family Medicine

## 2022-09-08 NOTE — Telephone Encounter (Signed)
Requested Prescriptions  Pending Prescriptions Disp Refills   metFORMIN (GLUCOPHAGE) 500 MG tablet [Pharmacy Med Name: METFORMIN HYDROCHLORIDE 500MG  TABLET] 180 tablet 1    Sig: TAKE TWO TABLETS BY MOUTH DAILY WITH A MEAL. PLEASE SET UP OFFICE VISIT WITH DOCTOR BEFORE FUTURE REFILLS     Endocrinology:  Diabetes - Biguanides Failed - 09/08/2022  9:29 AM      Failed - B12 Level in normal range and within 720 days    No results found for: "VITAMINB12"       Failed - Valid encounter within last 6 months    Recent Outpatient Visits           1 year ago Essential hypertension   [facility] Tanya Nones, Priscille Heidelberg, MD   1 year ago Right flank pain   [facility] Tanya Nones, Priscille Heidelberg, MD   1 year ago Encounter for Medicare annual wellness exam   [facility] Donita Brooks, MD   2 years ago Diabetes mellitus type 2, uncontrolled, with complications (HCC)   [facility] Donita Brooks, MD   3 years ago Diabetes mellitus type 2, uncontrolled, with complications (HCC)   [facility] Pickard, Priscille Heidelberg, MD       Future Appointments             In 2 months Marcine Matar, MD [facility] [place]            Failed - CBC within normal limits and completed in the last 12 months    WBC  Date Value Ref Range Status  09/04/2022 7.9 3.8 - 10.8 Thousand/uL Final   RBC  Date Value Ref Range Status  09/04/2022 4.53 4.20 - 5.80 Million/uL Final   Hemoglobin  Date Value Ref Range Status  09/04/2022 13.1 (L) 13.2 - 17.1 g/dL Final   HCT  Date Value Ref Range Status  09/04/2022 40.0 38.5 - 50.0 % Final   MCHC  Date Value Ref Range Status  09/04/2022 32.8 32.0 - 36.0 g/dL Final   [facility]  Date Value Ref Range Status  09/04/2022 28.9 27.0 - 33.0 pg Final   MCV  Date Value Ref Range Status  09/04/2022 88.3 80.0 - 100.0 fL Final   No results found for: "PLTCOUNTKUC", "LABPLAT", "POCPLA" RDW  Date  Value Ref Range Status  09/04/2022 12.5 11.0 - 15.0 % Final         Passed - Cr in normal range and within 360 days    Creat  Date Value Ref Range Status  09/04/2022 1.17 0.70 - 1.22 mg/dL Final   Creatinine, Urine  Date Value Ref Range Status  09/04/2022 156 20 - 320 mg/dL Final         Passed - HBA1C is between 0 and 7.9 and within 180 days    Hgb A1C (fingerstick)  Date Value Ref Range Status  09/10/2018 6.0 (H) <6.0 % OF TOTAL HGB Final   Hgb A1c MFr Bld  Date Value Ref Range Status  09/04/2022 6.8 (H) <5.7 % of total Hgb Final    Comment:    For someone without known diabetes, a hemoglobin A1c value of 6.5% or greater indicates that they may have  diabetes and this should be confirmed with a follow-up  test. . For someone with known diabetes, a value <7% indicates  that their diabetes is well controlled and a value  greater than or equal to 7% indicates suboptimal  control. A1c targets should be individualized based on  duration of diabetes, age, comorbid conditions, and  other considerations. . Currently, no consensus exists regarding use of hemoglobin A1c for diagnosis of diabetes for children. .          Passed - eGFR in normal range and within 360 days    GFR, Est African American  Date Value Ref Range Status  10/27/2019 71 > OR = 60 mL/min/1.101m2 Final   GFR, Est Non African American  Date Value Ref Range Status  10/27/2019 61 > OR = 60 mL/min/1.93m2 Final   GFR  Date Value Ref Range Status  09/26/2011 65.44 >60.00 mL/min Final   eGFR  Date Value Ref Range Status  09/04/2022 63 > OR = 60 mL/min/1.66m2 Final

## 2022-09-16 ENCOUNTER — Other Ambulatory Visit: Payer: Self-pay | Admitting: Family Medicine

## 2022-09-18 NOTE — Telephone Encounter (Signed)
Requested Prescriptions  Pending Prescriptions Disp Refills   atorvastatin (LIPITOR) 10 MG tablet [Pharmacy Med Name: ATORVASTATIN TAB 10MG ] 90 tablet 3    Sig: TAKE 1 TABLET DAILY     Cardiovascular:  Antilipid - Statins Failed - 09/16/2022  2:09 AM      Failed - Valid encounter within last 12 months    Recent Outpatient Visits           1 year ago Essential hypertension   [facility] Pickard, Priscille Heidelberg, MD   1 year ago Right flank pain   [facility] Pickard, Priscille Heidelberg, MD   1 year ago Encounter for Medicare annual wellness exam   [facility] Donita Brooks, MD   2 years ago Diabetes mellitus type 2, uncontrolled, with complications (HCC)   [facility] Donita Brooks, MD   4 years ago Diabetes mellitus type 2, uncontrolled, with complications (HCC)   [facility] Pickard, Priscille Heidelberg, MD       Future Appointments             In 2 months Marcine Matar, MD [facility] [place]            Failed - Lipid Panel in normal range within the last 12 months    Cholesterol  Date Value Ref Range Status  09/04/2022 127 <200 mg/dL Final   LDL Cholesterol (Calc)  Date Value Ref Range Status  09/04/2022 60 mg/dL (calc) Final    Comment:    Reference range: <100 . Desirable range <100 mg/dL for primary prevention;   <70 mg/dL for patients with CHD or diabetic patients  with > or = 2 CHD risk factors. Marland Kitchen LDL-C is now calculated using the Martin-Hopkins  calculation, which is a validated novel method providing  better accuracy than the Friedewald equation in the  estimation of LDL-C.  Horald Pollen et al. Lenox Ahr. 1610;960(45): 2061-2068  (http://education.[employer].com/faq/FAQ164)    HDL  Date Value Ref Range Status  09/04/2022 51 > OR = 40 mg/dL Final   Triglycerides  Date Value Ref Range Status  09/04/2022 81 <150 mg/dL Final         Passed - Patient is not pregnant

## 2022-09-19 ENCOUNTER — Ambulatory Visit: Payer: Medicare HMO | Admitting: Urology

## 2022-10-03 DIAGNOSIS — M5416 Radiculopathy, lumbar region: Secondary | ICD-10-CM | POA: Diagnosis not present

## 2022-10-03 DIAGNOSIS — M545 Low back pain, unspecified: Secondary | ICD-10-CM | POA: Diagnosis not present

## 2022-10-13 ENCOUNTER — Other Ambulatory Visit: Payer: Self-pay | Admitting: Family Medicine

## 2022-10-13 DIAGNOSIS — D1801 Hemangioma of skin and subcutaneous tissue: Secondary | ICD-10-CM | POA: Diagnosis not present

## 2022-10-13 DIAGNOSIS — Z85828 Personal history of other malignant neoplasm of skin: Secondary | ICD-10-CM | POA: Diagnosis not present

## 2022-10-13 DIAGNOSIS — L570 Actinic keratosis: Secondary | ICD-10-CM | POA: Diagnosis not present

## 2022-10-13 DIAGNOSIS — D225 Melanocytic nevi of trunk: Secondary | ICD-10-CM | POA: Diagnosis not present

## 2022-10-13 DIAGNOSIS — L821 Other seborrheic keratosis: Secondary | ICD-10-CM | POA: Diagnosis not present

## 2022-10-13 NOTE — Telephone Encounter (Signed)
Requested Prescriptions  Pending Prescriptions Disp Refills   olmesartan (BENICAR) 40 MG tablet [Pharmacy Med Name: OLMESARTAN TAB 40MG ] 90 tablet 1    Sig: TAKE 1 TABLET DAILY     Cardiovascular:  Angiotensin Receptor Blockers Failed - 10/13/2022  8:07 AM      Failed - Valid encounter within last 6 months    Recent Outpatient Visits           1 year ago Essential hypertension   [facility] Donita Brooks, MD   1 year ago Right flank pain   [facility] Donita Brooks, MD   1 year ago Encounter for Medicare annual wellness exam   [facility] Donita Brooks, MD   2 years ago Diabetes mellitus type 2, uncontrolled, with complications (HCC)   [facility] Donita Brooks, MD   4 years ago Diabetes mellitus type 2, uncontrolled, with complications (HCC)   [facility] Pickard, Priscille Heidelberg, MD       Future Appointments             In 1 month Marcine Matar, MD [facility]             Passed - Cr in normal range and within 180 days    Creat  Date Value Ref Range Status  09/04/2022 1.17 0.70 - 1.22 mg/dL Final   Creatinine, Urine  Date Value Ref Range Status  09/04/2022 156 20 - 320 mg/dL Final         Passed - K in normal range and within 180 days    Potassium  Date Value Ref Range Status  09/04/2022 4.5 3.5 - 5.3 mmol/L Final         Passed - Patient is not pregnant      Passed - Last BP in normal range    BP Readings from Last 1 Encounters:  09/04/22 110/60

## 2022-10-16 ENCOUNTER — Other Ambulatory Visit: Payer: Self-pay

## 2022-10-16 ENCOUNTER — Emergency Department (HOSPITAL_BASED_OUTPATIENT_CLINIC_OR_DEPARTMENT_OTHER): Payer: Medicare HMO | Admitting: Radiology

## 2022-10-16 ENCOUNTER — Encounter (HOSPITAL_BASED_OUTPATIENT_CLINIC_OR_DEPARTMENT_OTHER): Payer: Self-pay

## 2022-10-16 ENCOUNTER — Other Ambulatory Visit (HOSPITAL_BASED_OUTPATIENT_CLINIC_OR_DEPARTMENT_OTHER): Payer: Self-pay

## 2022-10-16 ENCOUNTER — Emergency Department (HOSPITAL_BASED_OUTPATIENT_CLINIC_OR_DEPARTMENT_OTHER)
Admission: EM | Admit: 2022-10-16 | Discharge: 2022-10-16 | Disposition: A | Discharge status: Home / Self Care | Payer: Medicare HMO | Attending: Emergency Medicine | Admitting: Emergency Medicine

## 2022-10-16 DIAGNOSIS — Z7901 Long term (current) use of anticoagulants: Secondary | ICD-10-CM | POA: Diagnosis not present

## 2022-10-16 DIAGNOSIS — Z79899 Other long term (current) drug therapy: Secondary | ICD-10-CM | POA: Diagnosis not present

## 2022-10-16 DIAGNOSIS — W540XXA Bitten by dog, initial encounter: Secondary | ICD-10-CM | POA: Diagnosis not present

## 2022-10-16 DIAGNOSIS — S51831A Puncture wound without foreign body of right forearm, initial encounter: Secondary | ICD-10-CM | POA: Insufficient documentation

## 2022-10-16 DIAGNOSIS — I10 Essential (primary) hypertension: Secondary | ICD-10-CM | POA: Diagnosis not present

## 2022-10-16 DIAGNOSIS — E119 Type 2 diabetes mellitus without complications: Secondary | ICD-10-CM | POA: Insufficient documentation

## 2022-10-16 DIAGNOSIS — I2510 Atherosclerotic heart disease of native coronary artery without angina pectoris: Secondary | ICD-10-CM | POA: Diagnosis not present

## 2022-10-16 DIAGNOSIS — S51851A Open bite of right forearm, initial encounter: Secondary | ICD-10-CM | POA: Diagnosis not present

## 2022-10-16 DIAGNOSIS — Z7984 Long term (current) use of oral hypoglycemic drugs: Secondary | ICD-10-CM | POA: Insufficient documentation

## 2022-10-16 MED ORDER — AMOXICILLIN-POT CLAVULANATE 875-125 MG PO TABS
1.0000 | ORAL_TABLET | Freq: Once | ORAL | Status: AC
  Administered 2022-10-16: 1 via ORAL
  Filled 2022-10-16: qty 1

## 2022-10-16 MED ORDER — AMOXICILLIN-POT CLAVULANATE 875-125 MG PO TABS
1.0000 | ORAL_TABLET | Freq: Two times a day (BID) | ORAL | 0 refills | Status: AC
  Filled 2022-10-16: qty 10, 5d supply, fill #0

## 2022-10-16 MED ORDER — TETANUS-DIPHTH-ACELL PERTUSSIS 5-2.5-18.5 LF-MCG/0.5 IM SUSY: 0.5000 mL | PREFILLED_SYRINGE | Freq: Once | INTRAMUSCULAR | Status: DC

## 2022-10-16 NOTE — ED Provider Notes (Signed)
[place] EMERGENCY DEPARTMENT AT [facility] Provider Note   CSN: [id number] Arrival date & time: 10/16/22  1102     History  Chief Complaint  Patient presents with   Animal Bite    Henry Martin is a 82 y.o. male with PMHx Afib, GERD, stroke, CAD, DM, HLD, HTN who presents to ED concerned for dog bite 2 hours ago. Dog belongs to daughter and is up to date on rabies vaccine. Patient has provided rabies certificate in ED. Patient denies any other symptoms today. Chart review showing tetanus booster given in 09/2018.    Animal Bite      Home Medications Prior to Admission medications   Medication Sig Start Date End Date Taking? Authorizing Provider  amoxicillin-clavulanate (AUGMENTIN) 875-125 MG tablet Take 1 tablet by mouth every 12 (twelve) hours for 5 days. 10/16/22 10/21/22 Yes , Charlotte Sanes F, PA-C  atorvastatin (LIPITOR) 10 MG tablet TAKE 1 TABLET DAILY 09/18/22   Donita Brooks, MD  Blood Glucose Monitoring Suppl (ACCU-CHEK AVIVA PLUS) w/Device KIT Use to check BS QAM 10/19/17   Donita Brooks, MD  Cholecalciferol (VITAMIN D3) 1000 UNITS CAPS Take 1,000 Units by mouth daily.     [provider]  cyclobenzaprine (FLEXERIL) 10 MG tablet Take 1 tablet (10 mg total) by mouth 3 (three) times daily as needed for muscle spasms. 10/14/21   Donita Brooks, MD  Fish Oil OIL Take 800 mg by mouth 2 (two) times daily.     [provider]  glucose blood (ACCU-CHEK AVIVA PLUS) test strip Check BC QAM DX: e11.9 10/19/17   Donita Brooks, MD  hydrochlorothiazide (HYDRODIURIL) 25 MG tablet TAKE 1 TABLET BY MOUTH ONCE A DAY 01/10/22   Donita Brooks, MD  Lancets (ACCU-CHEK MULTICLIX) lancets Check BC QAM DX: e11.9 Patient not taking: Reported on 08/29/2022 10/19/17   Donita Brooks, MD  Lancets Misc. (ACCU-CHEK MULTICLIX LANCET DEV) KIT Check BC QAM DX: e11.9 Patient not taking: Reported on 09/04/2022 10/19/17   Donita Brooks, MD  metFORMIN  (GLUCOPHAGE) 500 MG tablet TAKE TWO TABLETS BY MOUTH DAILY WITH A MEAL. PLEASE SET UP OFFICE VISIT WITH DOCTOR BEFORE FUTURE REFILLS 09/08/22   Donita Brooks, MD  metoprolol succinate (TOPROL-XL) 25 MG 24 hr tablet TAKE 1/2 TABLET AT BEDTIME 02/28/22   Donita Brooks, MD  olmesartan (BENICAR) 40 MG tablet TAKE 1 TABLET DAILY 10/13/22   Donita Brooks, MD  tamsulosin (FLOMAX) 0.4 MG CAPS capsule Take 1 capsule (0.4 mg total) by mouth daily. 06/17/21   Donita Brooks, MD  Vibegron (GEMTESA) 75 MG TABS Take 1 tablet by mouth. Samples from Urologist    [provider]  XARELTO 20 MG TABS tablet TAKE 1 TABLET BY MOUTH ONCE DAILY 04/25/22   Donita Brooks, MD      Allergies    Contrast media [iodinated contrast media] and Metrizamide    Review of Systems   Review of Systems  Skin:        Dog bite    Physical Exam Updated Vital Signs BP (!) 165/59 (BP Location: Right Arm)   Pulse 66   Temp 97.9 F (36.6 C) (Oral)   Resp 18   Ht 6' (1.829 m)   Wt 101.6 kg   SpO2 99%   BMI 30.38 kg/m  Physical Exam Vitals and nursing note reviewed.  Constitutional:      General: He is not in acute distress.    Appearance:  He is not ill-appearing or toxic-appearing.  HENT:     Head: Normocephalic and atraumatic.  Eyes:     General: No scleral icterus.       Right eye: No discharge.        Left eye: No discharge.     Conjunctiva/sclera: Conjunctivae normal.  Cardiovascular:     Rate and Rhythm: Normal rate.  Pulmonary:     Effort: Pulmonary effort is normal.  Abdominal:     General: Abdomen is flat.  Skin:    General: Skin is warm and dry.     Capillary Refill: Capillary refill takes less than 2 seconds.     Comments: 3 puncture wounds and 2 lacerations on right forearm. Area non-tense. +2 radial pulse, brisk capillary refill, and sensation to light touch intact. No active bleeding, surrounding erythema/swelling, or purulence.  Neurological:     General: No focal deficit  present.     Mental Status: He is alert. Mental status is at baseline.  Psychiatric:        Mood and Affect: Mood normal.        Behavior: Behavior normal.     ED Results / Procedures / Treatments   Labs (all labs ordered are listed, but only abnormal results are displayed) Labs Reviewed - No data to display  EKG None  Radiology DG Forearm Right  Result Date: 10/16/2022 CLINICAL DATA:  Dog bite EXAM: RIGHT FOREARM - 2 VIEW COMPARISON:  None Available. FINDINGS: There is no evidence of fracture or other focal bone lesions. Soft tissues are unremarkable. IMPRESSION: No acute osseous abnormality. Electronically Signed   By: Karen Kays M.D.   On: 10/16/2022 12:24    Procedures Procedures    Medications Ordered in ED Medications  amoxicillin-clavulanate (AUGMENTIN) 875-125 MG per tablet 1 tablet (1 tablet Oral Given 10/16/22 1210)    ED Course/ Medical Decision Making/ A&P                                 Medical Decision Making Amount and/or Complexity of Data Reviewed Radiology: ordered.  Risk Prescription drug management.   This patient presents to the ED for concern of dog bite, this involves an extensive number of treatment options, and is a complaint that carries with it a high risk of complications and morbidity.  The differential diagnosis includes cellulitis, abscess, rabies infection   Co morbidities that complicate the patient evaluation  Afib, GERD, stroke, CAD, DM, HLD, HTN   Imaging Studies ordered:  I ordered imaging studies including  - right forearm xray: to assess for foreign bodies I independently visualized and interpreted imaging  I agree with the radiologist interpretation    Problem List / ED Course / Critical interventions / Medication management  Patient presenting to ED after dog bite. Dog belongs to daughter. Patient brought up-to-date rabies vaccine paperwork to ED.  Patient without fever, and other vital signs are stable. 3 puncture  wounds and 2 lacerations on right forearm without active bleeding or surrounding erythema/purulence/swelling. No neurovascular compromise on physical exam - +2 radial pulse, brisk capillary refill, and sensation to light touch intact of right hand and all fingers. Chart review showed tetanus booster last given 09/2018. Patient denies past history of immune/lymphatic compromise. Patient educated on the need of a prophylactic antibiotic which I have prescribed. Patient tolerated first dose of ABX well in ED.  Xray without concern for foreign bodies. Wound was  cleaned and dressing applied in ED. I have reviewed the patients home medicines and have made adjustments as needed Patient was given return precautions. Patient stable for discharge at this time. Patient verbalized understanding of plan.   Social Determinants of Health:  none            Final Clinical Impression(s) / ED Diagnoses Final diagnoses:  Dog bite, initial encounter    Rx / DC Orders ED Discharge Orders          Ordered    amoxicillin-clavulanate (AUGMENTIN) 875-125 MG tablet  Every 12 hours        10/16/22 1315              Dorthy Cooler, [place] 10/16/22 1340    Tanda Rockers A, DO 10/19/22 [phone]

## 2022-10-16 NOTE — ED Notes (Signed)
Patient verbalizes understanding of discharge instructions. Opportunity for questioning and answers were provided. Patient discharged from ED.  °

## 2022-10-16 NOTE — Discharge Instructions (Signed)
It was a pleasure caring for you today.  I have sent prescription for antibiotics to your pharmacy which you will need you to take over the next 5 days.  Seek emergency care if experiencing any new or worsening symptoms.

## 2022-10-16 NOTE — ED Triage Notes (Signed)
Pt to ED via POV c/o animal bite. Pt was bit by known dog aprox 1 hour ago. Reports animal up to date on vaccines. Wound noted to right lower arm. Bleeding controlled in triage

## 2022-10-16 NOTE — ED Notes (Signed)
Bite wrapped with non adherent dressing and kerlix

## 2022-10-19 DIAGNOSIS — M5416 Radiculopathy, lumbar region: Secondary | ICD-10-CM | POA: Diagnosis not present

## 2022-10-23 ENCOUNTER — Ambulatory Visit (INDEPENDENT_AMBULATORY_CARE_PROVIDER_SITE_OTHER): Payer: Medicare HMO | Admitting: Family Medicine

## 2022-10-23 VITALS — BP 126/74 | HR 107 | Temp 97.9°F | Ht 72.0 in | Wt 224.0 lb

## 2022-10-23 DIAGNOSIS — W540XXD Bitten by dog, subsequent encounter: Secondary | ICD-10-CM | POA: Diagnosis not present

## 2022-10-23 DIAGNOSIS — S51801D Unspecified open wound of right forearm, subsequent encounter: Secondary | ICD-10-CM

## 2022-10-23 MED ORDER — AMOXICILLIN-POT CLAVULANATE 875-125 MG PO TABS: 1.0000 | ORAL_TABLET | Freq: Two times a day (BID) | ORAL | 0 refills | Status: DC
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
## 2022-10-24 ENCOUNTER — Telehealth: Payer: Self-pay

## 2022-10-24 NOTE — Telephone Encounter (Signed)
Transition Care Management Unsuccessful Follow-up Telephone Call  Date of discharge and from where:  [facility] 8/12  Attempts:  1st Attempt  Reason for unsuccessful TCM follow-up call:  No answer/busy   Lenard Forth [facility] Guide, [facility] [phone] [address] [place], [place], [place] [address] Phone: [phone] Email: [email or web address]

## 2022-10-30 ENCOUNTER — Other Ambulatory Visit: Payer: Self-pay | Admitting: Family Medicine

## 2022-10-31 NOTE — Telephone Encounter (Signed)
Requested Prescriptions  Pending Prescriptions Disp Refills   hydrochlorothiazide (HYDRODIURIL) 25 MG tablet [Pharmacy Med Name: HYDROCHLOROTHIAZIDE 25MG  TABLET] 90 tablet 0    Sig: TAKE ONE TABLET BY MOUTH ONCE A DAY     Cardiovascular: Diuretics - Thiazide Failed - 10/30/2022  9:26 AM      Failed - Valid encounter within last 6 months    Recent Outpatient Visits           1 year ago Essential hypertension   [facility] Pickard, Priscille Heidelberg, MD   1 year ago Right flank pain   [facility] Donita Brooks, MD   2 years ago Encounter for Medicare annual wellness exam   [facility] Donita Brooks, MD   3 years ago Diabetes mellitus type 2, uncontrolled, with complications (HCC)   [facility] Donita Brooks, MD   4 years ago Diabetes mellitus type 2, uncontrolled, with complications (HCC)   [facility] Pickard, Priscille Heidelberg, MD       Future Appointments             In 4 weeks Marcine Matar, MD [facility] [facility]            Passed - Cr in normal range and within 180 days    Creat  Date Value Ref Range Status  09/04/2022 1.17 0.70 - 1.22 mg/dL Final   Creatinine, Urine  Date Value Ref Range Status  09/04/2022 156 20 - 320 mg/dL Final         Passed - K in normal range and within 180 days    Potassium  Date Value Ref Range Status  09/04/2022 4.5 3.5 - 5.3 mmol/L Final         Passed - Na in normal range and within 180 days    Sodium  Date Value Ref Range Status  09/04/2022 140 135 - 146 mmol/L Final         Passed - Last BP in normal range    BP Readings from Last 1 Encounters:  10/23/22 126/74

## 2022-11-14 ENCOUNTER — Other Ambulatory Visit (HOSPITAL_BASED_OUTPATIENT_CLINIC_OR_DEPARTMENT_OTHER): Payer: Self-pay
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
## 2022-11-28 ENCOUNTER — Encounter: Payer: Self-pay | Admitting: Urology

## 2022-11-28 ENCOUNTER — Ambulatory Visit: Payer: Medicare HMO | Admitting: Urology

## 2022-11-28 VITALS — BP 144/72 | HR 67

## 2022-11-28 DIAGNOSIS — N401 Enlarged prostate with lower urinary tract symptoms: Secondary | ICD-10-CM | POA: Diagnosis not present

## 2022-11-28 DIAGNOSIS — R350 Frequency of micturition: Secondary | ICD-10-CM

## 2022-11-28 DIAGNOSIS — R3915 Urgency of urination: Secondary | ICD-10-CM | POA: Diagnosis not present

## 2022-11-28 DIAGNOSIS — R351 Nocturia: Secondary | ICD-10-CM

## 2022-11-28 DIAGNOSIS — N138 Other obstructive and reflux uropathy: Secondary | ICD-10-CM | POA: Diagnosis not present

## 2022-11-28 DIAGNOSIS — Z85528 Personal history of other malignant neoplasm of kidney: Secondary | ICD-10-CM

## 2022-11-28 LAB — URINALYSIS, ROUTINE W REFLEX MICROSCOPIC
Bilirubin, UA: NEGATIVE
Glucose, UA: NEGATIVE
Ketones, UA: NEGATIVE
Leukocytes,UA: NEGATIVE
Nitrite, UA: NEGATIVE
RBC, UA: NEGATIVE
Specific Gravity, UA: 1.015 (ref 1.005–1.030)
Urobilinogen, Ur: 4 mg/dL — ABNORMAL HIGH (ref 0.2–1.0)
pH, UA: 7 (ref 5.0–7.5)

## 2022-11-28 LAB — BLADDER SCAN AMB NON-IMAGING: Scan Result: 23

## 2022-11-28 MED ORDER — SOLIFENACIN SUCCINATE 10 MG PO TABS: 10.0000 mg | ORAL_TABLET | Freq: Every day | ORAL | 11 refills | Status: DC

## 2023-01-23 NOTE — Telephone Encounter (Signed)
Error

## 2023-01-26 ENCOUNTER — Other Ambulatory Visit: Payer: Self-pay | Admitting: Family Medicine

## 2023-01-29 NOTE — Telephone Encounter (Signed)
Requested medication (s) are due for refill today:   Yes  Requested medication (s) are on the active medication list:   Yes  Future visit scheduled:   No   LOV 10/23/2022   Looks like last CPE was 08/16/2021.   Last ordered: 10/31/2022 #90, 0 refills  Unable to refill due to CPE needed.     Requested Prescriptions  Pending Prescriptions Disp Refills   hydrochlorothiazide (HYDRODIURIL) 25 MG tablet [Pharmacy Med Name: HYDROCHLOROTHIAZIDE 25MG  TABLET] 90 tablet 0    Sig: TAKE ONE TABLET BY MOUTH ONCE A DAY     Cardiovascular: Diuretics - Thiazide Failed - 01/26/2023  1:21 PM      Failed - Last BP in normal range    BP Readings from Last 1 Encounters:  11/28/22 (!) 144/72         Failed - Valid encounter within last 6 months    Recent Outpatient Visits           1 year ago Essential hypertension   [facility] Donita Brooks, MD   1 year ago Right flank pain   [facility] Donita Brooks, MD   2 years ago Encounter for Medicare annual wellness exam   [facility] Donita Brooks, MD   3 years ago Diabetes mellitus type 2, uncontrolled, with complications (HCC)   [facility] Donita Brooks, MD   4 years ago Diabetes mellitus type 2, uncontrolled, with complications (HCC)   [facility] Pickard, Priscille Heidelberg, MD       Future Appointments             In 7 months Marcine Matar, MD [facility] [place]            Passed - Cr in normal range and within 180 days    Creat  Date Value Ref Range Status  09/04/2022 1.17 0.70 - 1.22 mg/dL Final   Creatinine, Urine  Date Value Ref Range Status  09/04/2022 156 20 - 320 mg/dL Final         Passed - K in normal range and within 180 days    Potassium  Date Value Ref Range Status  09/04/2022 4.5 3.5 - 5.3 mmol/L Final         Passed - Na in normal range and within 180 days    Sodium  Date Value Ref Range Status   09/04/2022 140 135 - 146 mmol/L Final

## 2023-02-05 ENCOUNTER — Other Ambulatory Visit: Payer: Self-pay | Admitting: Family Medicine

## 2023-02-08 NOTE — Telephone Encounter (Signed)
Requested medication (s) are due for refill today: Yes  Requested medication (s) are on the active medication list: Yes  Last refill:  09/08/22  Future visit scheduled: No  Notes to clinic:  Pt. Needs OV.    Requested Prescriptions  Pending Prescriptions Disp Refills   metFORMIN (GLUCOPHAGE) 500 MG tablet [Pharmacy Med Name: METFORMIN HYDROCHLORIDE 500MG  TABLET] 180 tablet 1    Sig: TAKE TWO TABLETS BY MOUTH DAILY WITH A MEAL. PLEASE SET UP OFFICE VISIT WITH DOCTOR BEFORE FUTURE REFILLS     Endocrinology:  Diabetes - Biguanides Failed - 02/05/2023  1:58 PM      Failed - B12 Level in normal range and within 720 days    No results found for: "VITAMINB12"       Failed - Valid encounter within last 6 months    Recent Outpatient Visits           1 year ago Essential hypertension   [facility] Tanya Nones, Priscille Heidelberg, MD   1 year ago Right flank pain   [facility] Donita Brooks, MD   2 years ago Encounter for Medicare annual wellness exam   [facility] Donita Brooks, MD   3 years ago Diabetes mellitus type 2, uncontrolled, with complications (HCC)   [facility] Donita Brooks, MD   4 years ago Diabetes mellitus type 2, uncontrolled, with complications (HCC)   [facility] Pickard, Priscille Heidelberg, MD       Future Appointments             In 6 months Marcine Matar, MD [facility] [place]            Passed - Cr in normal range and within 360 days    Creat  Date Value Ref Range Status  09/04/2022 1.17 0.70 - 1.22 mg/dL Final   Creatinine, Urine  Date Value Ref Range Status  09/04/2022 156 20 - 320 mg/dL Final         Passed - HBA1C is between 0 and 7.9 and within 180 days    Hgb A1C (fingerstick)  Date Value Ref Range Status  09/10/2018 6.0 (H) <6.0 % OF TOTAL HGB Final   Hgb A1c MFr Bld  Date Value Ref Range Status  09/04/2022 6.8 (H) <5.7 % of total Hgb Final     Comment:    For someone without known diabetes, a hemoglobin A1c value of 6.5% or greater indicates that they may have  diabetes and this should be confirmed with a follow-up  test. . For someone with known diabetes, a value <7% indicates  that their diabetes is well controlled and a value  greater than or equal to 7% indicates suboptimal  control. A1c targets should be individualized based on  duration of diabetes, age, comorbid conditions, and  other considerations. . Currently, no consensus exists regarding use of hemoglobin A1c for diagnosis of diabetes for children. .          Passed - eGFR in normal range and within 360 days    GFR, Est African American  Date Value Ref Range Status  10/27/2019 71 > OR = 60 mL/min/1.74m2 Final   GFR, Est Non African American  Date Value Ref Range Status  10/27/2019 61 > OR = 60 mL/min/1.6m2 Final   GFR  Date Value Ref Range Status  09/26/2011 65.44 >60.00 mL/min Final   eGFR  Date Value Ref Range Status  09/04/2022 63 > OR = 60 mL/min/1.19m2 Final         Passed - CBC within normal limits and completed in the last 12 months    WBC  Date Value Ref Range Status  09/04/2022 7.9 3.8 - 10.8 Thousand/uL Final   RBC  Date Value Ref Range Status  09/04/2022 4.53 4.20 - 5.80 Million/uL Final   Hemoglobin  Date Value Ref Range Status  09/04/2022 13.1 (L) 13.2 - 17.1 g/dL Final   HCT  Date Value Ref Range Status  09/04/2022 40.0 38.5 - 50.0 % Final   MCHC  Date Value Ref Range Status  09/04/2022 32.8 32.0 - 36.0 g/dL Final   [facility]  Date Value Ref Range Status  09/04/2022 28.9 27.0 - 33.0 pg Final   MCV  Date Value Ref Range Status  09/04/2022 88.3 80.0 - 100.0 fL Final   No results found for: "PLTCOUNTKUC", "LABPLAT", "POCPLA" RDW  Date Value Ref Range Status  09/04/2022 12.5 11.0 - 15.0 % Final

## 2023-02-20 ENCOUNTER — Telehealth: Payer: Self-pay

## 2023-02-20 NOTE — Telephone Encounter (Signed)
Copied from CRM [id number]. Topic: Clinical - Medication Refill >> Feb 20, 2023 12:21 PM Tiffany H wrote: Most Recent Primary Care Visit:  Provider: Lynnea Ferrier T  Department[facility]  Visit Type: OFFICE VISIT  Date: 10/23/2022  Medication: metFORMIN (GLUCOPHAGE) 500 MG tablet  hydrochlorothiazide (HYDRODIURIL) 25 MG tablet  Has the patient contacted their pharmacy? Yes (Agent: If no, request that the patient contact the pharmacy for the refill. If patient does not wish to contact the pharmacy document the reason why and proceed with request.) (Agent: If yes, when and what did the pharmacy advise?)  Is this the correct pharmacy for this prescription? Yes If no, delete pharmacy and type the correct one.  This is the patient's preferred pharmacy:   [facility] - [place], [place] - [address] [place] [address] Phone: [phone] Fax: [phone]  Has the prescription been filled recently? Yes  Is the patient out of the medication? Yes  Has the patient been seen for an appointment in the last year OR does the patient have an upcoming appointment? Yes  Can we respond through MyChart? No  Agent: [place] pharmacy is having trouble completing transmissions for refills electronically. Please reach out to their pharmacy to provide updated E-script data.

## 2023-02-25 ENCOUNTER — Other Ambulatory Visit: Payer: Self-pay | Admitting: Family Medicine

## 2023-02-26 ENCOUNTER — Other Ambulatory Visit: Payer: Self-pay

## 2023-02-26 ENCOUNTER — Telehealth: Payer: Self-pay | Admitting: Family Medicine

## 2023-02-26 DIAGNOSIS — E1169 Type 2 diabetes mellitus with other specified complication: Secondary | ICD-10-CM

## 2023-02-26 DIAGNOSIS — I10 Essential (primary) hypertension: Secondary | ICD-10-CM

## 2023-02-26 MED ORDER — HYDROCHLOROTHIAZIDE 25 MG PO TABS: 25.0000 mg | ORAL_TABLET | Freq: Every day | ORAL | 0 refills | Status: DC

## 2023-02-26 MED ORDER — METFORMIN HCL 500 MG PO TABS
1000.0000 mg | ORAL_TABLET | Freq: Every day | ORAL | 0 refills | Status: DC
Start: 2023-02-26 — End: 2023-05-25

## 2023-02-26 NOTE — Telephone Encounter (Signed)
Prescription Request  02/26/2023  LOV: 10/23/2022  What is the name of the medication or equipment?   metFORMIN (GLUCOPHAGE) 500 MG tablet [[id number]]   hydrochlorothiazide (HYDRODIURIL) 25 MG tablet [[id number]]   **Patient will be out of medication tomorrow**  Have you contacted your pharmacy to request a refill? Yes   Which pharmacy would you like this sent to?  [facility] - [place], [place] - [address] [place] [address] Phone: [phone] Fax: [phone]    Patient notified that their request is being sent to the clinical staff for review and that they should receive a response within 2 business days.   Please advise patient at [phone].

## 2023-03-16 ENCOUNTER — Other Ambulatory Visit: Payer: Self-pay | Admitting: Family Medicine

## 2023-03-19 NOTE — Telephone Encounter (Signed)
 Requested medication (s) are due for refill today: Yes  Requested medication (s) are on the active medication list: Yes  Last refill:  04/25/22  Future visit scheduled: No  Notes to clinic:  See request.    Requested Prescriptions  Pending Prescriptions Disp Refills   XARELTO  20 MG TABS tablet [Pharmacy Med Name: XARELTO  20MG  TABLET] 90 tablet 3    Sig: TAKE ONE TABLET BY MOUTH ONCE DAILY     Hematology: Anticoagulants - rivaroxaban  Failed - 03/19/2023  3:08 PM      Failed - HGB in normal range and within 360 days    Hemoglobin  Date Value Ref Range Status  09/04/2022 13.1 (L) 13.2 - 17.1 g/dL Final         Failed - Valid encounter within last 12 months    Recent Outpatient Visits           1 year ago Essential hypertension   [facility] Duanne Butler DASEN, MD   1 year ago Right flank pain   [facility] Duanne Butler DASEN, MD   2 years ago Encounter for Medicare annual wellness exam   [facility] Duanne Butler DASEN, MD   3 years ago Diabetes mellitus type 2, uncontrolled, with complications (HCC)   [facility] Pickard, Butler DASEN, MD   4 years ago Diabetes mellitus type 2, uncontrolled, with complications (HCC)   [facility] Pickard, Butler DASEN, MD       Future Appointments             In 5 months Matilda Senior, MD [facility] [place]            Passed - ALT in normal range and within 360 days    ALT  Date Value Ref Range Status  09/04/2022 14 9 - 46 U/L Final         Passed - AST in normal range and within 360 days    AST  Date Value Ref Range Status  09/04/2022 11 10 - 35 U/L Final         Passed - Cr in normal range and within 360 days    Creat  Date Value Ref Range Status  09/04/2022 1.17 0.70 - 1.22 mg/dL Final   Creatinine, Urine  Date Value Ref Range Status  09/04/2022 156 20 - 320 mg/dL Final         Passed - HCT in normal range and within 360  days    HCT  Date Value Ref Range Status  09/04/2022 40.0 38.5 - 50.0 % Final         Passed - PLT in normal range and within 360 days    Platelets  Date Value Ref Range Status  09/04/2022 196 140 - 400 Thousand/uL Final         Passed - eGFR is 15 or above and within 360 days    GFR, Est African American  Date Value Ref Range Status  10/27/2019 71 > OR = 60 mL/min/1.47m2 Final   GFR, Est Non African American  Date Value Ref Range Status  10/27/2019 61 > OR = 60 mL/min/1.44m2 Final   GFR  Date Value Ref Range Status  09/26/2011 65.44 >60.00 mL/min Final   eGFR  Date Value Ref Range Status  09/04/2022 63 > OR = 60 mL/min/1.69m2 Final         Passed - Patient is not pregnant

## 2023-03-30 ENCOUNTER — Telehealth: Payer: Self-pay | Admitting: Urology

## 2023-03-30 ENCOUNTER — Other Ambulatory Visit: Payer: Self-pay | Admitting: Urology

## 2023-03-30 DIAGNOSIS — N138 Other obstructive and reflux uropathy: Secondary | ICD-10-CM

## 2023-03-30 MED ORDER — TAMSULOSIN HCL 0.4 MG PO CAPS: 0.4000 mg | ORAL_CAPSULE | Freq: Every day | ORAL | 11 refills | Status: DC

## 2023-03-30 NOTE — Telephone Encounter (Signed)
Hey never prescribed before is it okay to refill ?

## 2023-03-30 NOTE — Telephone Encounter (Signed)
Patient is out of his  tamsulosin (FLOMAX) 0.4 MG CAPS capsule  and needs a refill sent to [facility]

## 2023-05-15 ENCOUNTER — Other Ambulatory Visit: Payer: Self-pay | Admitting: Family Medicine

## 2023-05-16 NOTE — Telephone Encounter (Signed)
 Requested medication (s) are due for refill today:   Yes  Requested medication (s) are on the active medication list:   Yes   Future visit scheduled:   Yes 08/02/2023.    Last CPE 09/04/2022   Had an acute OV 10/23/2022   Last ordered: 10/13/2022 #90,1 refill  Returned because 6 month labs just expired so unable to refill per protocol.      Requested Prescriptions  Pending Prescriptions Disp Refills   olmesartan (BENICAR) 40 MG tablet [Pharmacy Med Name: OLMESARTAN TAB 40MG ] 90 tablet 1    Sig: TAKE 1 TABLET DAILY     Cardiovascular:  Angiotensin Receptor Blockers Failed - 05/16/2023  8:20 AM      Failed - Cr in normal range and within 180 days    Creat  Date Value Ref Range Status  09/04/2022 1.17 0.70 - 1.22 mg/dL Final   Creatinine, Urine  Date Value Ref Range Status  09/04/2022 156 20 - 320 mg/dL Final         Failed - K in normal range and within 180 days    Potassium  Date Value Ref Range Status  09/04/2022 4.5 3.5 - 5.3 mmol/L Final         Failed - Last BP in normal range    BP Readings from Last 1 Encounters:  11/28/22 (!) 144/72         Failed - Valid encounter within last 6 months    Recent Outpatient Visits           1 year ago Essential hypertension   [facility] Donita Brooks, MD   2 years ago Right flank pain   [facility] Donita Brooks, MD   2 years ago Encounter for Medicare annual wellness exam   [facility] Donita Brooks, MD   3 years ago Diabetes mellitus type 2, uncontrolled, with complications (HCC)   [facility] Donita Brooks, MD   4 years ago Diabetes mellitus type 2, uncontrolled, with complications (HCC)   [facility] Pickard, Priscille Heidelberg, MD       Future Appointments             In 3 months Marcine Matar, MD [facility] - Patient is not pregnant

## 2023-05-17 ENCOUNTER — Other Ambulatory Visit: Payer: Self-pay | Admitting: Family Medicine

## 2023-05-17 DIAGNOSIS — I10 Essential (primary) hypertension: Secondary | ICD-10-CM

## 2023-05-18 NOTE — Telephone Encounter (Signed)
 Courtesy refill. Requested Prescriptions  Pending Prescriptions Disp Refills   hydrochlorothiazide (HYDRODIURIL) 25 MG tablet [Pharmacy Med Name: HYDROCHLOROTHIAZIDE 25MG  TABLET] 30 tablet 0    Sig: TAKE ONE TABLET (25 MG TOTAL) BY MOUTH DAILY.     Cardiovascular: Diuretics - Thiazide Failed - 05/18/2023  7:42 AM      Failed - Cr in normal range and within 180 days    Creat  Date Value Ref Range Status  09/04/2022 1.17 0.70 - 1.22 mg/dL Final   Creatinine, Urine  Date Value Ref Range Status  09/04/2022 156 20 - 320 mg/dL Final         Failed - K in normal range and within 180 days    Potassium  Date Value Ref Range Status  09/04/2022 4.5 3.5 - 5.3 mmol/L Final         Failed - Na in normal range and within 180 days    Sodium  Date Value Ref Range Status  09/04/2022 140 135 - 146 mmol/L Final         Failed - Last BP in normal range    BP Readings from Last 1 Encounters:  11/28/22 (!) 144/72         Failed - Valid encounter within last 6 months    Recent Outpatient Visits           1 year ago Essential hypertension   [facility] Donita Brooks, MD   2 years ago Right flank pain   [facility] Tanya Nones, Priscille Heidelberg, MD   2 years ago Encounter for Medicare annual wellness exam   [facility] Donita Brooks, MD   3 years ago Diabetes mellitus type 2, uncontrolled, with complications (HCC)   [facility] Donita Brooks, MD   4 years ago Diabetes mellitus type 2, uncontrolled, with complications (HCC)   [facility] Pickard, Priscille Heidelberg, MD       Future Appointments             In 3 months Dahlstedt, Jeannett Senior, MD [facility] [place]             rivaroxaban (XARELTO) 20 MG TABS tablet [Pharmacy Med Name: XARELTO 20MG  TABLET] 30 tablet 0    Sig: TAKE ONE TABLET BY MOUTH ONCE DAILY     Hematology: Anticoagulants - rivaroxaban Failed - 05/18/2023  7:42 AM      Failed -  HGB in normal range and within 360 days    Hemoglobin  Date Value Ref Range Status  09/04/2022 13.1 (L) 13.2 - 17.1 g/dL Final         Failed - Valid encounter within last 12 months    Recent Outpatient Visits           1 year ago Essential hypertension   [facility] Donita Brooks, MD   2 years ago Right flank pain   [facility] Tanya Nones, Priscille Heidelberg, MD   2 years ago Encounter for Medicare annual wellness exam   [facility] Donita Brooks, MD   3 years ago Diabetes mellitus type 2, uncontrolled, with complications (HCC)   [facility] Donita Brooks, MD   4 years ago Diabetes mellitus type 2, uncontrolled, with complications (HCC)   [facility] Pickard, Priscille Heidelberg, MD       Future Appointments  In 3 months Marcine Matar, MD [facility] [place]            Passed - ALT in normal range and within 360 days    ALT  Date Value Ref Range Status  09/04/2022 14 9 - 46 U/L Final         Passed - AST in normal range and within 360 days    AST  Date Value Ref Range Status  09/04/2022 11 10 - 35 U/L Final         Passed - Cr in normal range and within 360 days    Creat  Date Value Ref Range Status  09/04/2022 1.17 0.70 - 1.22 mg/dL Final   Creatinine, Urine  Date Value Ref Range Status  09/04/2022 156 20 - 320 mg/dL Final         Passed - HCT in normal range and within 360 days    HCT  Date Value Ref Range Status  09/04/2022 40.0 38.5 - 50.0 % Final         Passed - PLT in normal range and within 360 days    Platelets  Date Value Ref Range Status  09/04/2022 196 140 - 400 Thousand/uL Final         Passed - eGFR is 15 or above and within 360 days    GFR, Est African American  Date Value Ref Range Status  10/27/2019 71 > OR = 60 mL/min/1.7m2 Final   GFR, Est Non African American  Date Value Ref Range Status  10/27/2019 61 > OR = 60  mL/min/1.74m2 Final   GFR  Date Value Ref Range Status  09/26/2011 65.44 >60.00 mL/min Final   eGFR  Date Value Ref Range Status  09/04/2022 63 > OR = 60 mL/min/1.54m2 Final         Passed - Patient is not pregnant

## 2023-05-24 ENCOUNTER — Other Ambulatory Visit: Payer: Self-pay | Admitting: Family Medicine

## 2023-05-24 DIAGNOSIS — E1169 Type 2 diabetes mellitus with other specified complication: Secondary | ICD-10-CM

## 2023-05-25 NOTE — Telephone Encounter (Signed)
 Last OV 09/04/22 Requested Prescriptions  Pending Prescriptions Disp Refills   metFORMIN (GLUCOPHAGE) 500 MG tablet [Pharmacy Med Name: METFORMIN HYDROCHLORIDE 500MG  TABLET] 180 tablet 0    Sig: TAKE TWO TABLETS (1,000 MG TOTAL) BY MOUTH DAILY WITH BREAKFAST.     Endocrinology:  Diabetes - Biguanides Failed - 05/25/2023  2:27 PM      Failed - HBA1C is between 0 and 7.9 and within 180 days    Hgb A1C (fingerstick)  Date Value Ref Range Status  09/10/2018 6.0 (H) <6.0 % OF TOTAL HGB Final   Hgb A1c MFr Bld  Date Value Ref Range Status  09/04/2022 6.8 (H) <5.7 % of total Hgb Final    Comment:    For someone without known diabetes, a hemoglobin A1c value of 6.5% or greater indicates that they may have  diabetes and this should be confirmed with a follow-up  test. . For someone with known diabetes, a value <7% indicates  that their diabetes is well controlled and a value  greater than or equal to 7% indicates suboptimal  control. A1c targets should be individualized based on  duration of diabetes, age, comorbid conditions, and  other considerations. . Currently, no consensus exists regarding use of hemoglobin A1c for diagnosis of diabetes for children. .          Failed - B12 Level in normal range and within 720 days    No results found for: "VITAMINB12"       Failed - Valid encounter within last 6 months    Recent Outpatient Visits           1 year ago Essential hypertension   [facility] Tanya Nones, Priscille Heidelberg, MD   2 years ago Right flank pain   [facility] Tanya Nones, Priscille Heidelberg, MD   2 years ago Encounter for Medicare annual wellness exam   [facility] Donita Brooks, MD   3 years ago Diabetes mellitus type 2, uncontrolled, with complications (HCC)   [facility] Donita Brooks, MD   4 years ago Diabetes mellitus type 2, uncontrolled, with complications (HCC)   [facility] Donita Brooks, MD       Future Appointments             In 3 months Marcine Matar, MD [facility] [place]            Passed - Cr in normal range and within 360 days    Creat  Date Value Ref Range Status  09/04/2022 1.17 0.70 - 1.22 mg/dL Final   Creatinine, Urine  Date Value Ref Range Status  09/04/2022 156 20 - 320 mg/dL Final         Passed - eGFR in normal range and within 360 days    GFR, Est African American  Date Value Ref Range Status  10/27/2019 71 > OR = 60 mL/min/1.89m2 Final   GFR, Est Non African American  Date Value Ref Range Status  10/27/2019 61 > OR = 60 mL/min/1.7m2 Final   GFR  Date Value Ref Range Status  09/26/2011 65.44 >60.00 mL/min Final   eGFR  Date Value Ref Range Status  09/04/2022 63 > OR = 60 mL/min/1.15m2 Final         Passed - CBC within normal limits and completed in the last 12 months    WBC  Date Value Ref Range Status  09/04/2022 7.9 3.8 - 10.8 Thousand/uL  Final   RBC  Date Value Ref Range Status  09/04/2022 4.53 4.20 - 5.80 Million/uL Final   Hemoglobin  Date Value Ref Range Status  09/04/2022 13.1 (L) 13.2 - 17.1 g/dL Final   HCT  Date Value Ref Range Status  09/04/2022 40.0 38.5 - 50.0 % Final   MCHC  Date Value Ref Range Status  09/04/2022 32.8 32.0 - 36.0 g/dL Final   [facility]  Date Value Ref Range Status  09/04/2022 28.9 27.0 - 33.0 pg Final   MCV  Date Value Ref Range Status  09/04/2022 88.3 80.0 - 100.0 fL Final   No results found for: "PLTCOUNTKUC", "LABPLAT", "POCPLA" RDW  Date Value Ref Range Status  09/04/2022 12.5 11.0 - 15.0 % Final

## 2023-06-01 ENCOUNTER — Other Ambulatory Visit

## 2023-06-01 DIAGNOSIS — E7800 Pure hypercholesterolemia, unspecified: Secondary | ICD-10-CM | POA: Diagnosis not present

## 2023-06-01 DIAGNOSIS — E1169 Type 2 diabetes mellitus with other specified complication: Secondary | ICD-10-CM

## 2023-06-01 DIAGNOSIS — I10 Essential (primary) hypertension: Secondary | ICD-10-CM

## 2023-06-01 DIAGNOSIS — I2510 Atherosclerotic heart disease of native coronary artery without angina pectoris: Secondary | ICD-10-CM

## 2023-06-02 LAB — COMPLETE METABOLIC PANEL WITHOUT GFR
AG Ratio: 1.6 (calc) (ref 1.0–2.5)
ALT: 15 U/L (ref 9–46)
AST: 13 U/L (ref 10–35)
Albumin: 4.1 g/dL (ref 3.6–5.1)
Alkaline phosphatase (APISO): 39 U/L (ref 35–144)
BUN: 21 mg/dL (ref 7–25)
CO2: 28 mmol/L (ref 20–32)
Calcium: 10 mg/dL (ref 8.6–10.3)
Chloride: 103 mmol/L (ref 98–110)
Creat: 1.14 mg/dL (ref 0.70–1.22)
Globulin: 2.5 g/dL (ref 1.9–3.7)
Glucose, Bld: 127 mg/dL — ABNORMAL HIGH (ref 65–99)
Potassium: 4.2 mmol/L (ref 3.5–5.3)
Sodium: 140 mmol/L (ref 135–146)
Total Bilirubin: 0.5 mg/dL (ref 0.2–1.2)
Total Protein: 6.6 g/dL (ref 6.1–8.1)

## 2023-06-02 LAB — LIPID PANEL
Cholesterol: 114 mg/dL (ref <200)
HDL: 40 mg/dL (ref >=40)
LDL Cholesterol (Calc): 58 mg/dL
Non-HDL Cholesterol (Calc): 74 mg/dL (ref <130)
Total CHOL/HDL Ratio: 2.9 (calc) (ref <5.0)
Triglycerides: 79 mg/dL (ref <150)

## 2023-06-02 LAB — HEMOGLOBIN A1C
Hgb A1c MFr Bld: 7.1 %{Hb} — ABNORMAL HIGH (ref <5.7)
Mean Plasma Glucose: 157 mg/dL
eAG (mmol/L): 8.7 mmol/L

## 2023-06-05 ENCOUNTER — Ambulatory Visit (INDEPENDENT_AMBULATORY_CARE_PROVIDER_SITE_OTHER): Admitting: Family Medicine

## 2023-06-05 VITALS — BP 132/70 | HR 81 | Temp 98.4°F | Ht 72.0 in | Wt 233.0 lb

## 2023-06-05 DIAGNOSIS — E1169 Type 2 diabetes mellitus with other specified complication: Secondary | ICD-10-CM | POA: Diagnosis not present

## 2023-06-05 DIAGNOSIS — E7800 Pure hypercholesterolemia, unspecified: Secondary | ICD-10-CM | POA: Diagnosis not present

## 2023-06-05 DIAGNOSIS — I10 Essential (primary) hypertension: Secondary | ICD-10-CM | POA: Diagnosis not present

## 2023-06-05 DIAGNOSIS — I2510 Atherosclerotic heart disease of native coronary artery without angina pectoris: Secondary | ICD-10-CM

## 2023-06-05 DIAGNOSIS — I480 Paroxysmal atrial fibrillation: Secondary | ICD-10-CM

## 2023-06-05 MED ORDER — OLMESARTAN MEDOXOMIL 40 MG PO TABS: 40.0000 mg | ORAL_TABLET | Freq: Every day | ORAL | 1 refills | Status: DC

## 2023-06-05 MED ORDER — METFORMIN HCL 500 MG PO TABS: 1000.0000 mg | ORAL_TABLET | Freq: Every day | ORAL | 1 refills | Status: DC

## 2023-06-05 MED ORDER — TAMSULOSIN HCL 0.4 MG PO CAPS: 0.4000 mg | ORAL_CAPSULE | Freq: Every day | ORAL | 11 refills | Status: AC

## 2023-06-05 MED ORDER — SOLIFENACIN SUCCINATE 10 MG PO TABS: 10.0000 mg | ORAL_TABLET | Freq: Every day | ORAL | 11 refills | Status: AC

## 2023-06-05 MED ORDER — METOPROLOL SUCCINATE ER 25 MG PO TB24: 12.5000 mg | ORAL_TABLET | Freq: Every day | ORAL | 3 refills | Status: DC

## 2023-06-05 MED ORDER — ATORVASTATIN CALCIUM 10 MG PO TABS: 10.0000 mg | ORAL_TABLET | Freq: Every day | ORAL | 3 refills | Status: AC

## 2023-06-05 MED ORDER — RIVAROXABAN 20 MG PO TABS: 20.0000 mg | ORAL_TABLET | Freq: Every day | ORAL | 0 refills | Status: DC

## 2023-06-05 MED ORDER — HYDROCHLOROTHIAZIDE 25 MG PO TABS: 25.0000 mg | ORAL_TABLET | Freq: Every day | ORAL | 1 refills | Status: DC
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
## 2023-06-13 ENCOUNTER — Other Ambulatory Visit: Payer: Self-pay | Admitting: Family Medicine

## 2023-06-14 NOTE — Telephone Encounter (Signed)
 Requested Prescriptions  Pending Prescriptions Disp Refills   rivaroxaban (XARELTO) 20 MG TABS tablet [Pharmacy Med Name: XARELTO 20MG  TABLET] 90 tablet 1    Sig: TAKE ONE TABLET (20 MG TOTAL) BY MOUTH DAILY.     Hematology: Anticoagulants - rivaroxaban Failed - 06/14/2023 12:41 PM      Failed - HGB in normal range and within 360 days    Hemoglobin  Date Value Ref Range Status  09/04/2022 13.1 (L) 13.2 - 17.1 g/dL Final         Passed - ALT in normal range and within 360 days    ALT  Date Value Ref Range Status  06/01/2023 15 9 - 46 U/L Final         Passed - AST in normal range and within 360 days    AST  Date Value Ref Range Status  06/01/2023 13 10 - 35 U/L Final         Passed - Cr in normal range and within 360 days    Creat  Date Value Ref Range Status  06/01/2023 1.14 0.70 - 1.22 mg/dL Final   Creatinine, Urine  Date Value Ref Range Status  09/04/2022 156 20 - 320 mg/dL Final         Passed - HCT in normal range and within 360 days    HCT  Date Value Ref Range Status  09/04/2022 40.0 38.5 - 50.0 % Final         Passed - PLT in normal range and within 360 days    Platelets  Date Value Ref Range Status  09/04/2022 196 140 - 400 Thousand/uL Final         Passed - eGFR is 15 or above and within 360 days    GFR, Est African American  Date Value Ref Range Status  10/27/2019 71 > OR = 60 mL/min/1.22m2 Final   GFR, Est Non African American  Date Value Ref Range Status  10/27/2019 61 > OR = 60 mL/min/1.4m2 Final   GFR  Date Value Ref Range Status  09/26/2011 65.44 >60.00 mL/min Final   eGFR  Date Value Ref Range Status  09/04/2022 63 > OR = 60 mL/min/1.62m2 Final         Passed - Patient is not pregnant      Passed - Valid encounter within last 12 months    Recent Outpatient Visits           1 week ago PAF (paroxysmal atrial fibrillation) (HCC)   [facility] Pickard, Priscille Heidelberg, MD   7 months ago Dog bite, subsequent  encounter   [place] [facility] Pickard, Priscille Heidelberg, MD   9 months ago Encounter for [employer] annual wellness exam   [facility] Pickard, Priscille Heidelberg, MD   1 year ago Strain of muscle, fascia and tendon of lower back, initial encounter   [place] [facility] Donita Brooks, MD   1 year ago Type 2 diabetes mellitus with other specified complication, without long-term current use of insulin [facility])   [place] [facility] Pickard, Priscille Heidelberg, MD       Future Appointments             In 2 months Marcine Matar, MD [facility] [place]

## 2023-08-01 ENCOUNTER — Telehealth: Payer: Self-pay

## 2023-08-01 NOTE — Telephone Encounter (Signed)
 FAX RECEIVED FROM [employer] FOR PT TO HAVE UACR AND EGFR DONE. PT IS DUE IN JULY 2025. NOTATION MADE IN CHART. MJP,LPN

## 2023-08-02 ENCOUNTER — Ambulatory Visit: Payer: Medicare HMO | Admitting: *Deleted

## 2023-08-02 DIAGNOSIS — Z0000 Encounter for general adult medical examination without abnormal findings: Secondary | ICD-10-CM | POA: Diagnosis not present
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
## 2023-08-02 NOTE — Patient Instructions (Signed)
 Henry Martin , Thank you for taking time to come for your Medicare Wellness Visit. I appreciate your ongoing commitment to your health goals. Please review the following plan we discussed and let me know if I can assist you in the future.   Screening recommendations/referrals: Colonoscopy: no longer required Recommended yearly ophthalmology/optometry visit for glaucoma screening and checkup Recommended yearly dental visit for hygiene and checkup  Vaccinations: Influenza vaccine: up to date Pneumococcal vaccine: up to date Tdap vaccine: up to date Shingles vaccine: up to date        Preventive Care 65 Years and Older, Male Preventive care refers to lifestyle choices and visits with your health care provider that can promote health and wellness. What does preventive care include? A yearly physical exam. This is also called an annual well check. Dental exams once or twice a year. Routine eye exams. Ask your health care provider how often you should have your eyes checked. Personal lifestyle choices, including: Daily care of your teeth and gums. Regular physical activity. Eating a healthy diet. Avoiding tobacco and drug use. Limiting alcohol use. Practicing safe sex. Taking low doses of aspirin every day. Taking vitamin and mineral supplements as recommended by your health care provider. What happens during an annual well check? The services and screenings done by your health care provider during your annual well check will depend on your age, overall health, lifestyle risk factors, and family history of disease. Counseling  Your health care provider may ask you questions about your: Alcohol use. Tobacco use. Drug use. Emotional well-being. Home and relationship well-being. Sexual activity. Eating habits. History of falls. Memory and ability to understand (cognition). Work and work Astronomer. Screening  You may have the following tests or measurements: Height, weight, and  BMI. Blood pressure. Lipid and cholesterol levels. These may be checked every 5 years, or more frequently if you are over 65 years old. Skin check. Lung cancer screening. You may have this screening every year starting at age 31 if you have a 30-pack-year history of smoking and currently smoke or have quit within the past 15 years. Fecal occult blood test (FOBT) of the stool. You may have this test every year starting at age 49. Flexible sigmoidoscopy or colonoscopy. You may have a sigmoidoscopy every 5 years or a colonoscopy every 10 years starting at age 6. Prostate cancer screening. Recommendations will vary depending on your family history and other risks. Hepatitis C blood test. Hepatitis B blood test. Sexually transmitted disease (STD) testing. Diabetes screening. This is done by checking your blood sugar (glucose) after you have not eaten for a while (fasting). You may have this done every 1-3 years. Abdominal aortic aneurysm (AAA) screening. You may need this if you are a current or former smoker. Osteoporosis. You may be screened starting at age 71 if you are at high risk. Talk with your health care provider about your test results, treatment options, and if necessary, the need for more tests. Vaccines  Your health care provider may recommend certain vaccines, such as: Influenza vaccine. This is recommended every year. Tetanus, diphtheria, and acellular pertussis (Tdap, Td) vaccine. You may need a Td booster every 10 years. Zoster vaccine. You may need this after age 76. Pneumococcal 13-valent conjugate (PCV13) vaccine. One dose is recommended after age 37. Pneumococcal polysaccharide (PPSV23) vaccine. One dose is recommended after age 33. Talk to your health care provider about which screenings and vaccines you need and how often you need them. This information is  not intended to replace advice given to you by your health care provider. Make sure you discuss any questions you have  with your health care provider. Document Released: 03/19/2015 Document Revised: 11/10/2015 Document Reviewed: 12/22/2014 Elsevier Interactive Patient Education  2017 [employer].  Fall Prevention in the Home Falls can cause injuries. They can happen to people of all ages. There are many things you can do to make your home safe and to help prevent falls. What can I do on the outside of my home? Regularly fix the edges of walkways and driveways and fix any cracks. Remove anything that might make you trip as you walk through a door, such as a raised step or threshold. Trim any bushes or trees on the path to your home. Use bright outdoor lighting. Clear any walking paths of anything that might make someone trip, such as rocks or tools. Regularly check to see if handrails are loose or broken. Make sure that both sides of any steps have handrails. Any raised decks and porches should have guardrails on the edges. Have any leaves, snow, or ice cleared regularly. Use sand or salt on walking paths during winter. Clean up any spills in your garage right away. This includes oil or grease spills. What can I do in the bathroom? Use night lights. Install grab bars by the toilet and in the tub and shower. Do not use towel bars as grab bars. Use non-skid mats or decals in the tub or shower. If you need to sit down in the shower, use a plastic, non-slip stool. Keep the floor dry. Clean up any water  that spills on the floor as soon as it happens. Remove soap buildup in the tub or shower regularly. Attach bath mats securely with double-sided non-slip rug tape. Do not have throw rugs and other things on the floor that can make you trip. What can I do in the bedroom? Use night lights. Make sure that you have a light by your bed that is easy to reach. Do not use any sheets or blankets that are too big for your bed. They should not hang down onto the floor. Have a firm chair that has side arms. You can use  this for support while you get dressed. Do not have throw rugs and other things on the floor that can make you trip. What can I do in the kitchen? Clean up any spills right away. Avoid walking on wet floors. Keep items that you use a lot in easy-to-reach places. If you need to reach something above you, use a strong step stool that has a grab bar. Keep electrical cords out of the way. Do not use floor polish or wax that makes floors slippery. If you must use wax, use non-skid floor wax. Do not have throw rugs and other things on the floor that can make you trip. What can I do with my stairs? Do not leave any items on the stairs. Make sure that there are handrails on both sides of the stairs and use them. Fix handrails that are broken or loose. Make sure that handrails are as long as the stairways. Check any carpeting to make sure that it is firmly attached to the stairs. Fix any carpet that is loose or worn. Avoid having throw rugs at the top or bottom of the stairs. If you do have throw rugs, attach them to the floor with carpet tape. Make sure that you have a light switch at the top of the  stairs and the bottom of the stairs. If you do not have them, ask someone to add them for you. What else can I do to help prevent falls? Wear shoes that: Do not have high heels. Have rubber bottoms. Are comfortable and fit you well. Are closed at the toe. Do not wear sandals. If you use a stepladder: Make sure that it is fully opened. Do not climb a closed stepladder. Make sure that both sides of the stepladder are locked into place. Ask someone to hold it for you, if possible. Clearly mark and make sure that you can see: Any grab bars or handrails. First and last steps. Where the edge of each step is. Use tools that help you move around (mobility aids) if they are needed. These include: Canes. Walkers. Scooters. Crutches. Turn on the lights when you go into a dark area. Replace any light bulbs  as soon as they burn out. Set up your furniture so you have a clear path. Avoid moving your furniture around. If any of your floors are uneven, fix them. If there are any pets around you, be aware of where they are. Review your medicines with your doctor. Some medicines can make you feel dizzy. This can increase your chance of falling. Ask your doctor what other things that you can do to help prevent falls. This information is not intended to replace advice given to you by your health care provider. Make sure you discuss any questions you have with your health care provider. Document Released: 12/17/2008 Document Revised: 07/29/2015 Document Reviewed: 03/27/2014 Elsevier Interactive Patient Education  2017 [employer].
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
## 2023-08-28 ENCOUNTER — Ambulatory Visit: Payer: Medicare HMO | Admitting: Urology

## 2023-08-28 VITALS — BP 155/67 | HR 80

## 2023-08-28 DIAGNOSIS — N401 Enlarged prostate with lower urinary tract symptoms: Secondary | ICD-10-CM

## 2023-08-28 DIAGNOSIS — Z85528 Personal history of other malignant neoplasm of kidney: Secondary | ICD-10-CM

## 2023-08-28 DIAGNOSIS — R351 Nocturia: Secondary | ICD-10-CM

## 2023-08-28 DIAGNOSIS — N5201 Erectile dysfunction due to arterial insufficiency: Secondary | ICD-10-CM

## 2023-08-28 DIAGNOSIS — N138 Other obstructive and reflux uropathy: Secondary | ICD-10-CM | POA: Diagnosis not present

## 2023-08-28 DIAGNOSIS — R350 Frequency of micturition: Secondary | ICD-10-CM | POA: Diagnosis not present

## 2023-08-28 LAB — URINALYSIS, ROUTINE W REFLEX MICROSCOPIC
Bilirubin, UA: NEGATIVE
Ketones, UA: NEGATIVE
Leukocytes,UA: NEGATIVE
Nitrite, UA: NEGATIVE
Protein,UA: NEGATIVE
RBC, UA: NEGATIVE
Specific Gravity, UA: 1.02 (ref 1.005–1.030)
Urobilinogen, Ur: 0.2 mg/dL (ref 0.2–1.0)
pH, UA: 6 (ref 5.0–7.5)

## 2023-08-28 LAB — BLADDER SCAN AMB NON-IMAGING: Scan Result: 68

## 2023-08-28 NOTE — Progress Notes (Signed)
 Bladder Scan completed today.  Patient can void prior to the bladder scan. Bladder scan result: 68  Performed By: Exie CMA

## 2023-08-31 DIAGNOSIS — H524 Presbyopia: Secondary | ICD-10-CM | POA: Diagnosis not present

## 2023-08-31 DIAGNOSIS — Z961 Presence of intraocular lens: Secondary | ICD-10-CM | POA: Diagnosis not present

## 2023-09-04 ENCOUNTER — Other Ambulatory Visit: Payer: Self-pay | Admitting: Family Medicine

## 2023-09-05 NOTE — Telephone Encounter (Signed)
 Refused atorvastatin  as it was sent to [facility] 06/05/2023 #90, 3 refills.

## 2023-09-06 ENCOUNTER — Other Ambulatory Visit

## 2023-09-06 DIAGNOSIS — E1169 Type 2 diabetes mellitus with other specified complication: Secondary | ICD-10-CM | POA: Diagnosis not present

## 2023-09-06 DIAGNOSIS — E7800 Pure hypercholesterolemia, unspecified: Secondary | ICD-10-CM | POA: Diagnosis not present

## 2023-09-06 DIAGNOSIS — I10 Essential (primary) hypertension: Secondary | ICD-10-CM

## 2023-09-06 DIAGNOSIS — Z7901 Long term (current) use of anticoagulants: Secondary | ICD-10-CM | POA: Diagnosis not present

## 2023-09-07 ENCOUNTER — Ambulatory Visit: Payer: Self-pay | Admitting: Family Medicine

## 2023-09-07 LAB — CBC WITH DIFFERENTIAL/PLATELET
Absolute Lymphocytes: 2795 {cells}/uL (ref 850–3900)
Absolute Monocytes: 762 {cells}/uL (ref 200–950)
Basophils Absolute: 100 {cells}/uL (ref 0–200)
Basophils Relative: 1.3 %
Eosinophils Absolute: 323 {cells}/uL (ref 15–500)
Eosinophils Relative: 4.2 %
HCT: 42.2 % (ref 38.5–50.0)
Hemoglobin: 13.4 g/dL (ref 13.2–17.1)
MCH: 28.7 pg (ref 27.0–33.0)
MCHC: 31.8 g/dL — ABNORMAL LOW (ref 32.0–36.0)
MCV: 90.4 fL (ref 80.0–100.0)
MPV: 12.4 fL (ref 7.5–12.5)
Monocytes Relative: 9.9 %
Neutro Abs: 3719 {cells}/uL (ref 1500–7800)
Neutrophils Relative %: 48.3 %
Platelets: 207 Thousand/uL (ref 140–400)
RBC: 4.67 Million/uL (ref 4.20–5.80)
RDW: 12.4 % (ref 11.0–15.0)
Total Lymphocyte: 36.3 %
WBC: 7.7 Thousand/uL (ref 3.8–10.8)

## 2023-09-07 LAB — COMPLETE METABOLIC PANEL WITHOUT GFR
AG Ratio: 1.6 (calc) (ref 1.0–2.5)
ALT: 12 U/L (ref 9–46)
AST: 11 U/L (ref 10–35)
Albumin: 4.1 g/dL (ref 3.6–5.1)
Alkaline phosphatase (APISO): 37 U/L (ref 35–144)
BUN/Creatinine Ratio: 12 (calc) (ref 6–22)
BUN: 17 mg/dL (ref 7–25)
CO2: 28 mmol/L (ref 20–32)
Calcium: 10.2 mg/dL (ref 8.6–10.3)
Chloride: 108 mmol/L (ref 98–110)
Creat: 1.38 mg/dL — ABNORMAL HIGH (ref 0.70–1.22)
Globulin: 2.6 g/dL (ref 1.9–3.7)
Glucose, Bld: 150 mg/dL — ABNORMAL HIGH (ref 65–99)
Potassium: 4.7 mmol/L (ref 3.5–5.3)
Sodium: 140 mmol/L (ref 135–146)
Total Bilirubin: 0.6 mg/dL (ref 0.2–1.2)
Total Protein: 6.7 g/dL (ref 6.1–8.1)

## 2023-09-07 LAB — LIPID PANEL
Cholesterol: 111 mg/dL (ref <200)
HDL: 39 mg/dL — ABNORMAL LOW (ref >=40)
LDL Cholesterol (Calc): 57 mg/dL
Non-HDL Cholesterol (Calc): 72 mg/dL (ref <130)
Total CHOL/HDL Ratio: 2.8 (calc) (ref <5.0)
Triglycerides: 73 mg/dL (ref <150)

## 2023-09-07 LAB — PROTEIN / CREATININE RATIO, URINE
Creatinine, Urine: 179 mg/dL (ref 20–320)
Protein/Creat Ratio: 78 mg/g{creat} (ref 25–148)
Protein/Creatinine Ratio: 0.078 mg/mg{creat} (ref 0.025–0.148)
Total Protein, Urine: 14 mg/dL (ref 5–25)

## 2023-09-07 LAB — HEMOGLOBIN A1C
Hgb A1c MFr Bld: 7.3 % — ABNORMAL HIGH (ref <5.7)
Mean Plasma Glucose: 163 mg/dL
eAG (mmol/L): 9 mmol/L

## 2023-09-10 ENCOUNTER — Ambulatory Visit (INDEPENDENT_AMBULATORY_CARE_PROVIDER_SITE_OTHER): Admitting: Family Medicine

## 2023-09-10 ENCOUNTER — Encounter: Payer: Self-pay | Admitting: Family Medicine

## 2023-09-10 VITALS — BP 140/80 | HR 57 | Temp 97.7°F | Ht 72.0 in | Wt 239.8 lb

## 2023-09-10 DIAGNOSIS — E1169 Type 2 diabetes mellitus with other specified complication: Secondary | ICD-10-CM

## 2023-09-10 DIAGNOSIS — I2510 Atherosclerotic heart disease of native coronary artery without angina pectoris: Secondary | ICD-10-CM | POA: Diagnosis not present

## 2023-09-10 DIAGNOSIS — Z0001 Encounter for general adult medical examination with abnormal findings: Secondary | ICD-10-CM | POA: Diagnosis not present

## 2023-09-10 DIAGNOSIS — I480 Paroxysmal atrial fibrillation: Secondary | ICD-10-CM

## 2023-09-10 DIAGNOSIS — Z7984 Long term (current) use of oral hypoglycemic drugs: Secondary | ICD-10-CM

## 2023-09-10 DIAGNOSIS — I10 Essential (primary) hypertension: Secondary | ICD-10-CM

## 2023-09-10 DIAGNOSIS — E7800 Pure hypercholesterolemia, unspecified: Secondary | ICD-10-CM

## 2023-09-10 DIAGNOSIS — Z0000 Encounter for general adult medical examination without abnormal findings: Secondary | ICD-10-CM
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
## 2023-10-05 DIAGNOSIS — Z008 Encounter for other general examination: Secondary | ICD-10-CM | POA: Diagnosis not present

## 2023-10-15 DIAGNOSIS — D485 Neoplasm of uncertain behavior of skin: Secondary | ICD-10-CM | POA: Diagnosis not present

## 2023-10-15 DIAGNOSIS — Z85828 Personal history of other malignant neoplasm of skin: Secondary | ICD-10-CM | POA: Diagnosis not present

## 2023-10-15 DIAGNOSIS — D235 Other benign neoplasm of skin of trunk: Secondary | ICD-10-CM | POA: Diagnosis not present

## 2023-10-15 DIAGNOSIS — L821 Other seborrheic keratosis: Secondary | ICD-10-CM | POA: Diagnosis not present

## 2023-10-15 DIAGNOSIS — L814 Other melanin hyperpigmentation: Secondary | ICD-10-CM | POA: Diagnosis not present

## 2023-10-15 DIAGNOSIS — D692 Other nonthrombocytopenic purpura: Secondary | ICD-10-CM | POA: Diagnosis not present

## 2023-10-15 DIAGNOSIS — L720 Epidermal cyst: Secondary | ICD-10-CM | POA: Diagnosis not present

## 2023-10-15 DIAGNOSIS — D1801 Hemangioma of skin and subcutaneous tissue: Secondary | ICD-10-CM | POA: Diagnosis not present

## 2023-10-15 DIAGNOSIS — L570 Actinic keratosis: Secondary | ICD-10-CM | POA: Diagnosis not present

## 2023-10-18 ENCOUNTER — Other Ambulatory Visit: Payer: Self-pay | Admitting: Family Medicine

## 2023-11-08 ENCOUNTER — Other Ambulatory Visit: Payer: Self-pay | Admitting: Family Medicine

## 2023-11-29 ENCOUNTER — Other Ambulatory Visit: Payer: Self-pay | Admitting: Family Medicine

## 2023-12-04 ENCOUNTER — Other Ambulatory Visit: Payer: Self-pay | Admitting: Family Medicine

## 2024-02-08 ENCOUNTER — Ambulatory Visit

## 2024-02-08 DIAGNOSIS — Z23 Encounter for immunization: Secondary | ICD-10-CM

## 2024-02-08 NOTE — Progress Notes (Signed)
 Patient is in office today for a nurse visit for Immunization. Patient Injection was given in the  Left deltoid. Patient tolerated injection well.

## 2024-02-15 NOTE — Progress Notes (Signed)
° °  02/15/2024  Patient ID: Henry Martin, male   DOB: May 08, 1940, 83 y.o.   MRN: [id number]  Pharmacy Quality Measure Review  This patient is appearing on a report for being at risk of failing the adherence measure for diabetes medications this calendar year.   Medication: metformin  01/21/24 Last fill date: 11/17 for 90 day supply  Insurance report was not up to date. No action needed at this time.   Lang Sieve, PharmD, BCGP Clinical Pharmacist  [phone]

## 2024-03-07 ENCOUNTER — Other Ambulatory Visit: Payer: Self-pay | Admitting: Family Medicine

## 2024-08-07 ENCOUNTER — Encounter
